# Patient Record
Sex: Female | Born: 1955 | Race: White | Hispanic: No | Smoking: Never smoker
Health system: Southern US, Community
[De-identification: ages and names within clinical notes are randomized; demographics above are authoritative.]

## PROBLEM LIST (undated history)

## (undated) DIAGNOSIS — E039 Hypothyroidism, unspecified: Secondary | ICD-10-CM

## (undated) DIAGNOSIS — Z8489 Family history of other specified conditions: Secondary | ICD-10-CM

## (undated) DIAGNOSIS — B1920 Unspecified viral hepatitis C without hepatic coma: Secondary | ICD-10-CM

## (undated) DIAGNOSIS — K219 Gastro-esophageal reflux disease without esophagitis: Secondary | ICD-10-CM

## (undated) DIAGNOSIS — Z87442 Personal history of urinary calculi: Secondary | ICD-10-CM

## (undated) DIAGNOSIS — R112 Nausea with vomiting, unspecified: Secondary | ICD-10-CM

## (undated) DIAGNOSIS — Z8052 Family history of malignant neoplasm of bladder: Secondary | ICD-10-CM

## (undated) DIAGNOSIS — C801 Malignant (primary) neoplasm, unspecified: Secondary | ICD-10-CM

## (undated) DIAGNOSIS — J189 Pneumonia, unspecified organism: Secondary | ICD-10-CM

## (undated) DIAGNOSIS — N200 Calculus of kidney: Secondary | ICD-10-CM

## (undated) DIAGNOSIS — I10 Essential (primary) hypertension: Secondary | ICD-10-CM

## (undated) HISTORY — PX: BREAST LUMPECTOMY: SHX2

## (undated) HISTORY — DX: Essential (primary) hypertension: I10

## (undated) HISTORY — PX: BREAST SURGERY: SHX581

## (undated) HISTORY — PX: BREAST EXCISIONAL BIOPSY: SUR124

## (undated) HISTORY — DX: Hypothyroidism, unspecified: E03.9

## (undated) HISTORY — DX: Family history of malignant neoplasm of bladder: Z80.52

## (undated) HISTORY — PX: ABDOMINAL HYSTERECTOMY: SHX81

---

## 1979-03-16 DIAGNOSIS — Z9289 Personal history of other medical treatment: Secondary | ICD-10-CM

## 1979-03-16 HISTORY — DX: Personal history of other medical treatment: Z92.89

## 1999-06-08 ENCOUNTER — Encounter: Payer: Self-pay | Admitting: General Surgery

## 1999-06-08 ENCOUNTER — Encounter: Admission: RE | Admit: 1999-06-08 | Discharge: 1999-06-08 | Payer: Self-pay | Admitting: General Surgery

## 2000-06-08 ENCOUNTER — Encounter: Admission: RE | Admit: 2000-06-08 | Discharge: 2000-06-08 | Payer: Self-pay | Admitting: General Surgery

## 2000-06-08 ENCOUNTER — Encounter: Payer: Self-pay | Admitting: General Surgery

## 2000-06-20 ENCOUNTER — Ambulatory Visit (HOSPITAL_COMMUNITY): Admission: RE | Admit: 2000-06-20 | Discharge: 2000-06-20 | Payer: Self-pay | Admitting: General Surgery

## 2000-06-20 ENCOUNTER — Encounter (INDEPENDENT_AMBULATORY_CARE_PROVIDER_SITE_OTHER): Payer: Self-pay

## 2001-06-28 ENCOUNTER — Encounter: Payer: Self-pay | Admitting: General Surgery

## 2001-06-28 ENCOUNTER — Encounter: Admission: RE | Admit: 2001-06-28 | Discharge: 2001-06-28 | Payer: Self-pay | Admitting: General Surgery

## 2002-07-05 ENCOUNTER — Encounter: Admission: RE | Admit: 2002-07-05 | Discharge: 2002-07-05 | Payer: Self-pay | Admitting: General Surgery

## 2002-07-05 ENCOUNTER — Encounter: Payer: Self-pay | Admitting: General Surgery

## 2003-07-08 ENCOUNTER — Encounter: Admission: RE | Admit: 2003-07-08 | Discharge: 2003-07-08 | Payer: Self-pay | Admitting: General Surgery

## 2004-06-17 ENCOUNTER — Encounter: Admission: RE | Admit: 2004-06-17 | Discharge: 2004-06-17 | Payer: Self-pay | Admitting: General Surgery

## 2004-12-29 ENCOUNTER — Encounter: Admission: RE | Admit: 2004-12-29 | Discharge: 2004-12-29 | Payer: Self-pay | Admitting: General Surgery

## 2005-08-19 ENCOUNTER — Encounter: Admission: RE | Admit: 2005-08-19 | Discharge: 2005-08-19 | Payer: Self-pay | Admitting: General Surgery

## 2006-09-20 ENCOUNTER — Encounter: Admission: RE | Admit: 2006-09-20 | Discharge: 2006-09-20 | Payer: Self-pay | Admitting: General Surgery

## 2007-03-13 ENCOUNTER — Encounter (INDEPENDENT_AMBULATORY_CARE_PROVIDER_SITE_OTHER): Payer: Self-pay | Admitting: Obstetrics and Gynecology

## 2007-03-13 ENCOUNTER — Inpatient Hospital Stay (HOSPITAL_COMMUNITY): Admission: RE | Admit: 2007-03-13 | Discharge: 2007-03-15 | Payer: Self-pay | Admitting: Obstetrics and Gynecology

## 2007-09-26 ENCOUNTER — Encounter: Admission: RE | Admit: 2007-09-26 | Discharge: 2007-09-26 | Payer: Self-pay | Admitting: Obstetrics and Gynecology

## 2008-05-01 ENCOUNTER — Ambulatory Visit: Admission: RE | Admit: 2008-05-01 | Discharge: 2008-05-01 | Payer: Self-pay | Admitting: Gynecology

## 2008-05-21 ENCOUNTER — Ambulatory Visit (HOSPITAL_COMMUNITY): Admission: RE | Admit: 2008-05-21 | Discharge: 2008-05-21 | Payer: Self-pay | Admitting: Gynecology

## 2008-05-21 ENCOUNTER — Encounter: Payer: Self-pay | Admitting: Gynecology

## 2008-05-30 ENCOUNTER — Encounter: Admission: RE | Admit: 2008-05-30 | Discharge: 2008-05-30 | Payer: Self-pay | Admitting: Gastroenterology

## 2008-07-04 ENCOUNTER — Encounter (INDEPENDENT_AMBULATORY_CARE_PROVIDER_SITE_OTHER): Payer: Self-pay | Admitting: Diagnostic Radiology

## 2008-07-04 ENCOUNTER — Ambulatory Visit (HOSPITAL_COMMUNITY): Admission: RE | Admit: 2008-07-04 | Discharge: 2008-07-04 | Payer: Self-pay | Admitting: Gastroenterology

## 2008-07-12 ENCOUNTER — Ambulatory Visit: Admission: RE | Admit: 2008-07-12 | Discharge: 2008-07-12 | Payer: Self-pay | Admitting: Gynecology

## 2008-09-06 ENCOUNTER — Ambulatory Visit: Admission: RE | Admit: 2008-09-06 | Discharge: 2008-09-06 | Payer: Self-pay | Admitting: Gynecology

## 2008-09-06 ENCOUNTER — Encounter: Payer: Self-pay | Admitting: Gynecology

## 2008-09-06 ENCOUNTER — Other Ambulatory Visit: Admission: RE | Admit: 2008-09-06 | Discharge: 2008-09-06 | Payer: Self-pay | Admitting: Gynecology

## 2008-09-26 ENCOUNTER — Encounter: Admission: RE | Admit: 2008-09-26 | Discharge: 2008-09-26 | Payer: Self-pay | Admitting: Obstetrics and Gynecology

## 2009-02-21 ENCOUNTER — Other Ambulatory Visit: Admission: RE | Admit: 2009-02-21 | Discharge: 2009-02-21 | Payer: Self-pay | Admitting: Gynecology

## 2009-02-21 ENCOUNTER — Ambulatory Visit: Admission: RE | Admit: 2009-02-21 | Discharge: 2009-02-21 | Payer: Self-pay | Admitting: Gynecology

## 2009-08-09 IMAGING — US US BIOPSY
1 series · 14 of 18 positions shown · non-contrast
Comparison: none

CLINICAL HISTORY: Hepatitis C

[Series 1: us biopsy · 0.23mm/px · 14 of 18 slices shown]
[im 1/18]
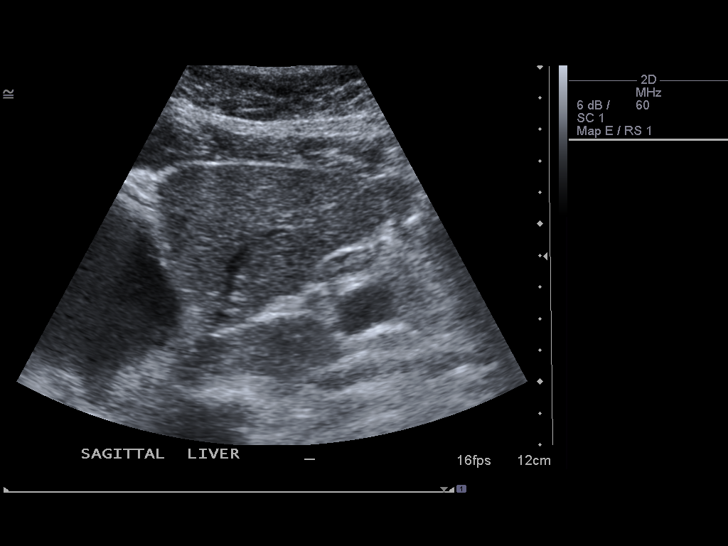
[im 2/18]
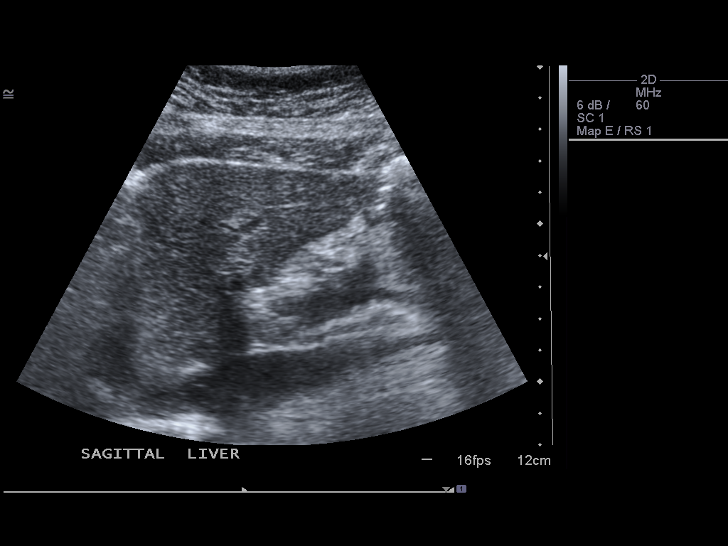
[im 4/18]
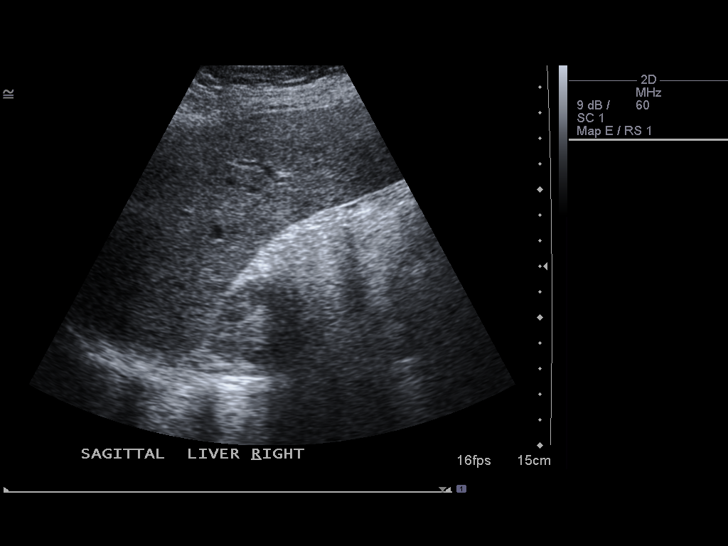
[im 5/18]
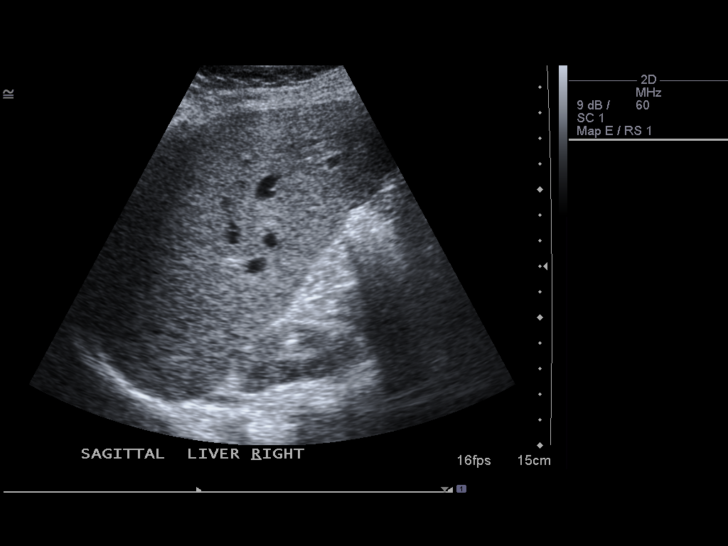
[im 6/18]
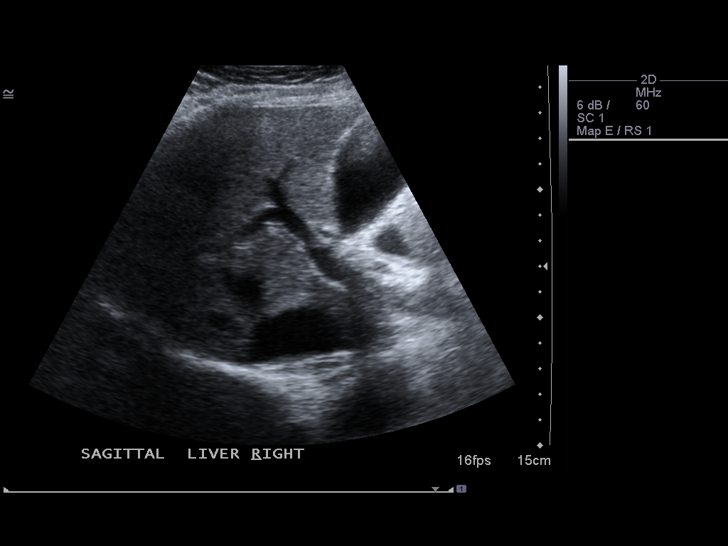
[im 8/18]
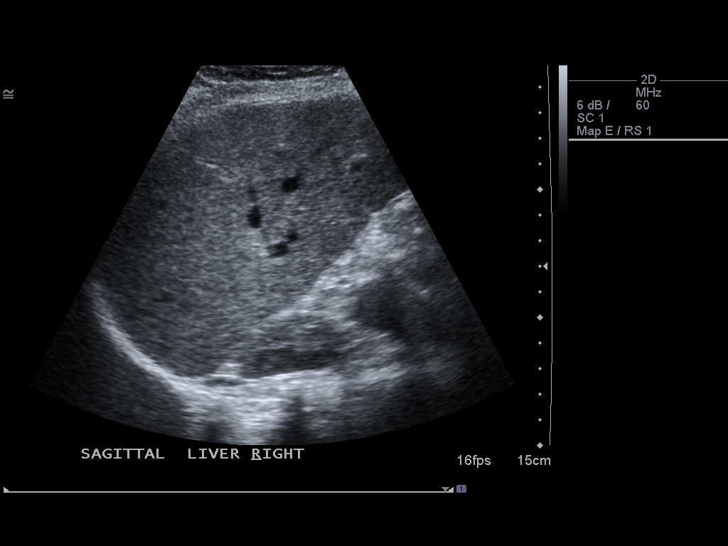
[im 9/18]
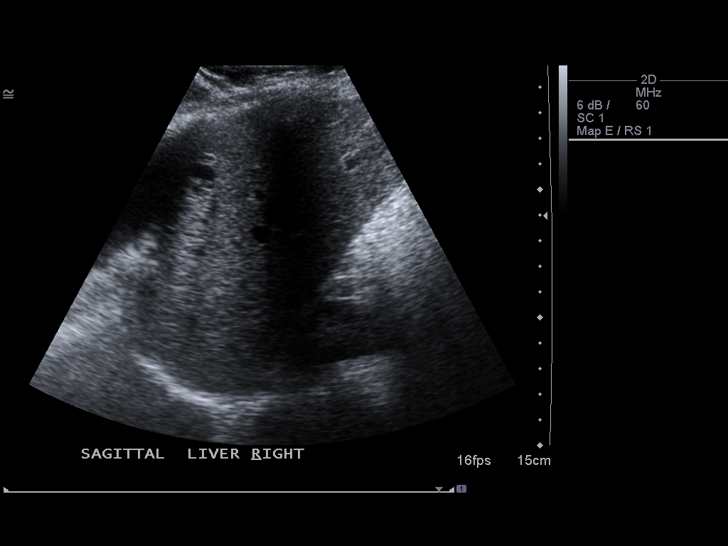
[im 10/18]
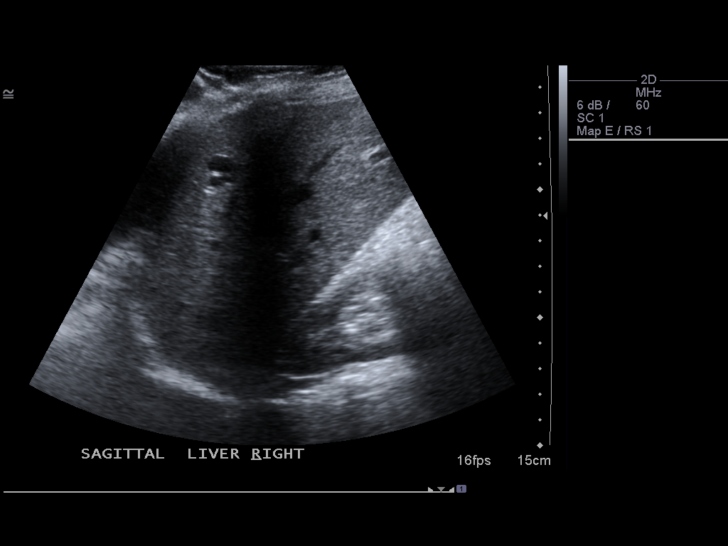
[im 11/18]
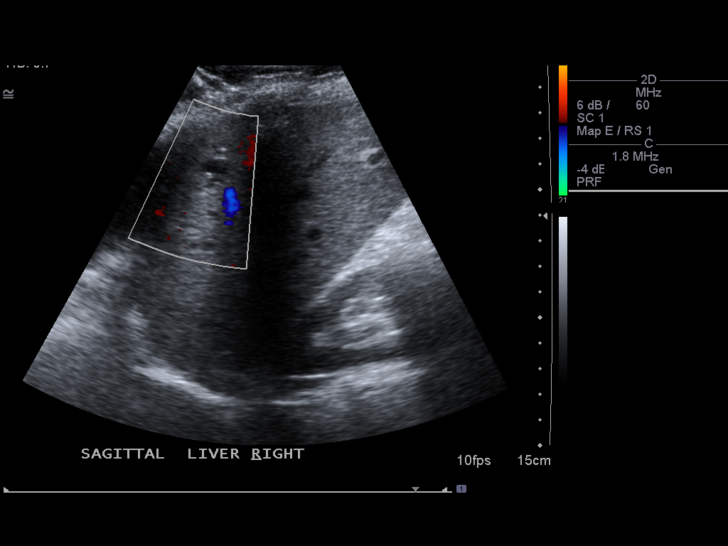
[im 13/18]
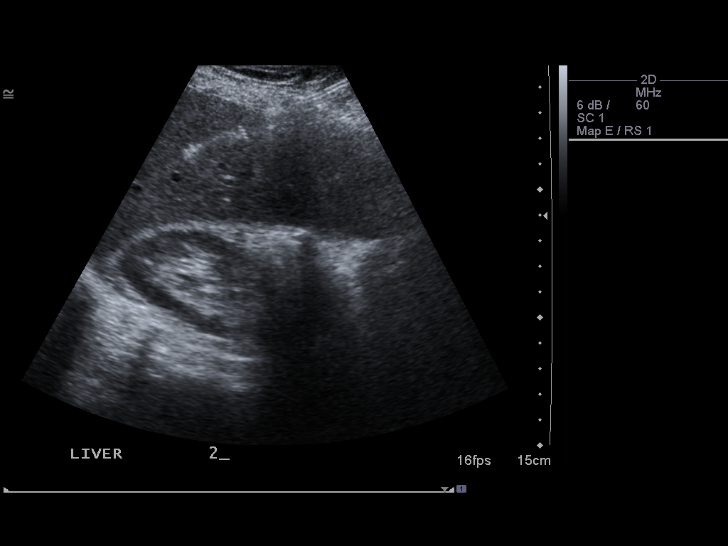
[im 14/18]
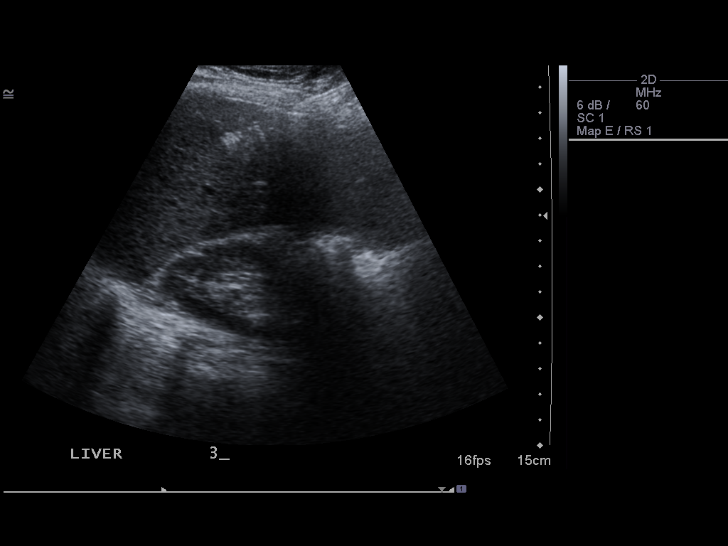
[im 15/18]
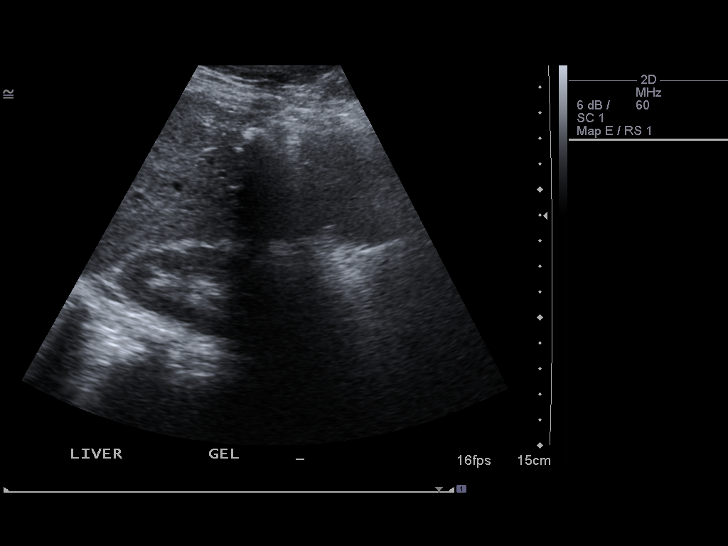
[im 17/18]
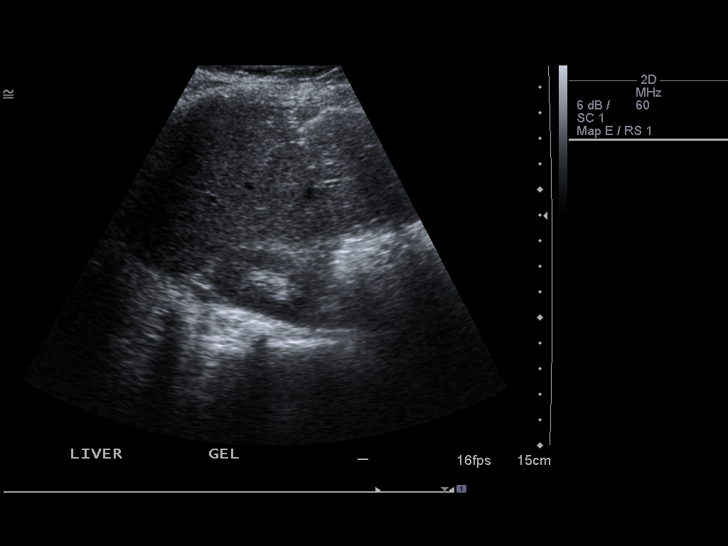
[im 18/18]
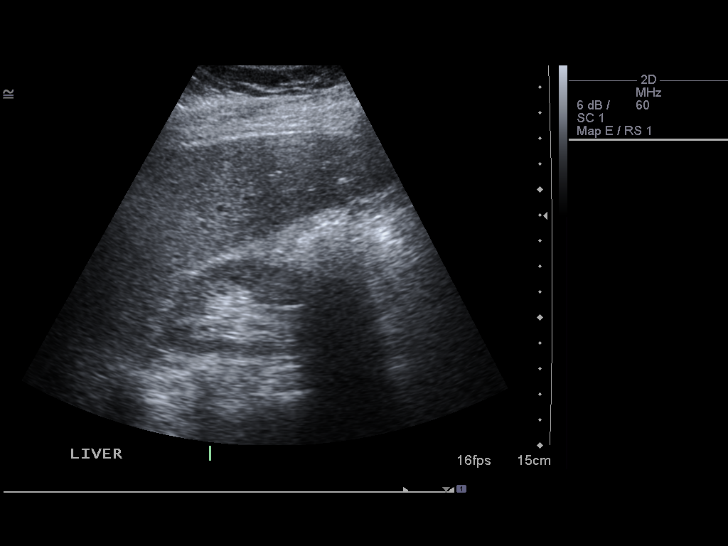

[14 of 18 positions shown; findings below may reference images not displayed]

PROCEDURE(S): ULTRASOUND GUIDED LIVER BIOPSY

Medications:Versed 4 mg, Fentanyl 100 mcg

Sedation time:30 minutes

Fluoroscopy time: None

Procedure:The procedure was explained to the patient.  The risks
and benefits of the procedure were discussed and the patient's
questions were addressed.  Informed consent was obtained from the
patient. The right abdomen was prepped and draped in a sterile
fashion.  The skin and the liver capsule were anesthetized with 1%
lidocaine.  17 gauge coaxial needle was directed in the right
hepatic lobe.  Three core biopsies were obtained with an 18 gauge
device.  The tract was embolized with Gelfoam. No immediate
complication.
FINDINGS: Normal appearance of the right hepatic lobe.
IMPRESSION: Ultrasound guided liver biopsy.

## 2009-09-03 ENCOUNTER — Other Ambulatory Visit: Admission: RE | Admit: 2009-09-03 | Discharge: 2009-09-03 | Payer: Self-pay | Admitting: Gynecology

## 2009-09-03 ENCOUNTER — Ambulatory Visit: Admission: RE | Admit: 2009-09-03 | Discharge: 2009-09-03 | Payer: Self-pay | Admitting: Gynecology

## 2009-09-29 ENCOUNTER — Encounter: Admission: RE | Admit: 2009-09-29 | Discharge: 2009-09-29 | Payer: Self-pay | Admitting: Obstetrics and Gynecology

## 2010-06-24 LAB — CBC
Hemoglobin: 14.5 g/dL (ref 12.0–15.0)
RBC: 4.34 MIL/uL (ref 3.87–5.11)
WBC: 4.5 10*3/uL (ref 4.0–10.5)

## 2010-06-25 LAB — COMPREHENSIVE METABOLIC PANEL
AST: 59 U/L — ABNORMAL HIGH (ref 0–37)
Albumin: 4.1 g/dL (ref 3.5–5.2)
BUN: 12 mg/dL (ref 6–23)
CO2: 30 mEq/L (ref 19–32)
GFR calc non Af Amer: >60 mL/min (ref >60)
Glucose, Bld: 105 mg/dL — ABNORMAL HIGH (ref 70–99)
Total Bilirubin: 1.2 mg/dL (ref 0.3–1.2)
Total Protein: 7 g/dL (ref 6.0–8.3)

## 2010-06-25 LAB — CBC
Hemoglobin: 14.6 g/dL (ref 12.0–15.0)
MCV: 96.2 fL (ref 78.0–100.0)
RBC: 4.48 MIL/uL (ref 3.87–5.11)
RDW: 13.2 % (ref 11.5–15.5)

## 2010-06-25 LAB — DIFFERENTIAL
Basophils Relative: 0 % (ref 0–1)
Eosinophils Absolute: 0.1 10*3/uL (ref 0.0–0.7)
Eosinophils Relative: 1 % (ref 0–5)
Lymphs Abs: 2.3 10*3/uL (ref 0.7–4.0)
Monocytes Absolute: 0.5 10*3/uL (ref 0.1–1.0)

## 2010-07-28 NOTE — Op Note (Signed)
NAME:  Amanda Herring, Amanda Herring               ACCOUNT NO.:  [id number]   MEDICAL RECORD NO.:  [id number]          PATIENT TYPE:  AMB   LOCATION:  SDC                           FACILITY:  [facility]   PHYSICIAN:  Michelle L. Grewal, M.D.DATE OF BIRTH:  [birth year]   DATE OF PROCEDURE:  03/13/2007  DATE OF DISCHARGE:                               OPERATIVE REPORT   PREOPERATIVE DIAGNOSES:  Persistent cervical dysplasia.   POSTOPERATIVE DIAGNOSES:  Persistent cervical dysplasia.   PROCEDURE:  Opened laparoscopic-assisted vaginal hysterectomy and  bilateral salpingo-oophorectomy.   SURGEON:  Dr. Vincente Poli.   ASSISTANT:  Dr. Renaldo Fiddler.   ANESTHESIA:  General.   FINDINGS:  Normal uterus, tubes and ovaries.   SPECIMENS:  Above sent to pathology.   ESTIMATED BLOOD LOSS:  200 mL.   DRAINS:  Foley catheter.   PROCEDURE:  The patient was taken to the operating room. She was  intubated without difficulty.  She was then prepped and draped in the  usual sterile fashion.  A Foley was inserted. A uterine manipulator was  inserted.  Attention was then turned to tummy where a small  infraumbilical incision was made after local was infiltrated. We then  grasped the fascia using Allis clamps, lifted that up and entered the  peritoneal cavity using Mayo scissors. I then placed a stitch of #0  Vicryl at each corner of the fascial incision, inserted the [facility]  laparoscope trocar and placed the pneumoperitoneum through this.  We  then placed the scope through the trocar sheath.  The patient of course  was status post two vertical C-sections, but she had no adhesions at all  and I felt that she was an excellent candidate for open LAVH/BSO. Her  ovaries appeared normal.  We initially identified each ovary.  I then  placed a 5 mm suprapubic trocar under direct visualization and then  identified the ureter which was well away from our IP ligament and  placed the gyrus just beneath each ovary, burned and transected it  and  carried it through to the round ligament.  This was done on the right  and then on the left with excellent hemostasis. After we did this part,  we then released the pneumoperitoneum and then turned our attention  vaginally where a weighted speculum was placed in the vagina.  A  circumferential incision was made around the cervix.  The posterior cul-  de-sac was entered sharply and the anterior cul-de-sac was entered  sharply as well.  We then placed curved Heaney clamps across the  uterosacral cardinal ligaments on either side.  Each pedicle was cut and  suture ligated using #0 Vicryl suture.  We then walked our way up the  broad ligament.  Each pedicle was suture ligated using O Vicryl sutures.  The specimen was removed. The posterior cuff was closed from 3 to 9  o'clock in a continuous running locked stitch using #0 Vicryl suture.  The cuff was then closed completely anterior to posterior in a running  locked stitch using #0 Vicryl suture.  At this point, no bleeding was  noted.  We  turned our attention back up to the abdomen where the  pneumoperitoneum was performed again.  The patient was placed in  Trendelenburg position.  The pelvis was  completely hemostatic.  We then released the pneumoperitoneum, removed  the trocars.  I then tied the two #0 Vicryl sutures down and closed the  skin using Dermabond skin adhesive at both sites.  All sponge, lap and  instrument counts were correct x2.  The patient went to the recovery  room in stable condition.      Michelle L. Vincente Poli, M.D.  Electronically Signed     MLG/MEDQ  D:  03/13/2007  T:  03/13/2007  Job:  [id number]

## 2010-07-28 NOTE — Op Note (Signed)
NAME:  Amanda Herring, Amanda Herring               ACCOUNT NO.:  [id number]   MEDICAL RECORD NO.:  [id number]          PATIENT TYPE:  AMB   LOCATION:  DAY                          FACILITY:  [facility]   PHYSICIAN:  De Blanch, M.D.DATE OF BIRTH:  11-10-1955   DATE OF PROCEDURE:  05/21/2008  DATE OF DISCHARGE:                               OPERATIVE REPORT   PREOPERATIVE DIAGNOSIS:  Severe dysplasia of the upper vagina.   POSTOPERATIVE DIAGNOSIS:  Severe dysplasia of the upper vagina.   PROCEDURE:  Partial upper vaginectomy.   SURGEON:  De Blanch, M.D.   ASSISTANT:  Antionette Char   ANESTHESIA:  General with orotracheal tube.   ESTIMATED BLOOD LOSS:  50 mL.   SURGICAL FINDINGS:  After applying Lugol's stain in the upper vagina, an  area of non-uptake was noted in the right upper vaginal fornix.  This  had previously been biopsied and confirmed to be severe dysplasia.   PROCEDURE:  The patient was brought to the operating room and, after  satisfactory attainment of general anesthesia, was positioned in candy-  cane stirrups.  The perineum and vagina were prepped with Hibiclens and  the patient was draped.  The bladder was emptied with a straight  catheter.  A weighted speculum placed in the posterior vagina, a Deaver  elevated the anterior vagina.  Lugol's solution was applied and the  lesion identified.  Using a 15-blade, a circumferential incision in the  vaginal mucosa was created around the lesion.  Using Stroulie scissors,  the vaginal mucosa was then excised.  In the course of the excising the  upper fornix, the peritoneal cavity was identified.  There was no bowel  injury.  Once the lesion was entirely excised, the posterior colpotomy  was closed with interrupted figure-of-eight sutures of 2-0 Vicryl.  Hemostasis in the vaginal mucosa was achieved with cautery,  then the vaginal mucosa reapproximated with interrupted sutures of 2-0  Vicryl suture.  Hemostasis was  noted to be excellent.  The patient was  awakened from anesthesia and taken to the recovery room in satisfactory  condition.  Sponge, needle and instrument counts correct x2.      De Blanch, M.D.  Electronically Signed     DC/MEDQ  D:  05/21/2008  T:  05/22/2008  Job:  [id number]   cc:   Telford Nab, R.N.  [address]  [place], [place] [address]   Roseanna Rainbow, M.D.  Fax: [phone]   Michelle L. Vincente Poli, M.D.  Fax: [phone]

## 2010-07-28 NOTE — Consult Note (Signed)
NAME:  Amanda Herring, Amanda Herring               ACCOUNT NO.:  [id number]   MEDICAL RECORD NO.:  [id number]          PATIENT TYPE:  OUT   LOCATION:  GYN                          FACILITY[facility]  [facility]   PHYSICIAN:  De Blanch, M.D.DATE OF BIRTH:  02/06/1956   DATE OF CONSULTATION:  DATE OF DISCHARGE:                                 CONSULTATION   CHIEF COMPLAINT:  Vaginal dysplasia.   HISTORY OF PRESENT ILLNESS:  A 55 year old white female seen in  consultation at the request of Dr. 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   The patient underwent a laparoscopically-assisted vaginal  hysterectomy/bilateral salpingo-oophorectomy in December of 2008 for  treatment of a high-grade SIL lesion.  She had previously had a cold  knife conization and a LEEP procedure.  Subsequently, she had a Pap  smear in January of 2010 which showed high-grade dysplasia and underwent  colposcopy with directed biopsies of the upper vagina showing high-grade  squamous intraepithelial lesion.   PAST MEDICAL HISTORY:   MEDICAL ILLNESSES:  None.   PAST SURGICAL HISTORY:  1. Cesarean section x2.  2. Tubal ligation.  3. Hysterectomy with bilateral salpingo-oophorectomy.  4. Breast lumpectomy.   CURRENT MEDICATIONS:  1. Xanax p.r.n. sleep.  2. Vitamin D.  3. Calcium.  4. Reflex medication.   DRUG ALLERGIES:  CODEINE.   SOCIAL HISTORY:  The patient does not smoke.  She is single.   FAMILY HISTORY:  Negative for gynecologic, breast or colon cancer.   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   PHYSICAL EXAMINATION:  VITAL SIGNS:  Height 5 feet 5 inches.  Weight 210  pounds.  GENERAL:  The patient is a healthy white female in no acute distress.  HEENT:  Negative.  NECK:  Supple without thyromegaly.  There is no supraclavicular or  inguinal adenopathy.  ABDOMEN:  Obese, soft, nontender.  No mass, organomegaly,  ascites or  hernias noted.  PELVIC:  Exam EG/BUS, vagina, bladder and urethra are normal.  The  vaginal cuff is well-supported.  No gross lesions are noted.  Bimanual  exam revealed no mass, induration or nodularity.   PROCEDURE NOTE:  Colposcopic examination of the vagina is performed.  There is a very discrete lesion in the upper right vagina in the angle.  This is consistent with severe dysplasia.  The left sciatic cuff appears  normal, and I do not see any other sites of disease when examining the  remainder of the vagina.   IMPRESSION:  Vaginal cuff recurrence of high-grade dysplasia,  predominantly in the right vaginal apex.  Management options would  include laser vaporization or wide excision (upper colpectomy).  Because  this lesion is located up in the fornix and is within folds, I think it  would be best treated with excision rather than rely on the laser to be  able to adequately ablate all of the lesion completely.  The patient is  in agreement with this plan.  We will schedule her for surgery in the  near future.  De Blanch, M.D.  Electronically Signed     DC/MEDQ  D:  05/01/2008  T:  05/01/2008  Job:  [id number]   cc:   Marcelino Duster L. Vincente Poli, M.D.  Fax: [phone]   Telford Nab, R.N.  [address]  [place], [place] [address]

## 2010-07-28 NOTE — Consult Note (Signed)
NAME:  Amanda Herring, Amanda Herring               ACCOUNT NO.:  [id number]   MEDICAL RECORD NO.:  [id number]          PATIENT TYPE:  OUT   LOCATION:  GYN                          FACILITY:  [facility]   PHYSICIAN:  De Blanch, M.D.DATE OF BIRTH:  1956-01-05   DATE OF CONSULTATION:  09/06/2008  DATE OF DISCHARGE:                                 CONSULTATION   GYN ONCOLOGY CLINIC   CHIEF COMPLAINT:  Vaginal dysplasia.   INTERVAL HISTORY:  The patient returns today for her first Pap smear  after having undergone an excision of the upper vagina on May 21, 2008.  Since her last visit she has done well.  She denies any GI or GU  symptoms.  She has no pelvic pain, pressure, vaginal bleeding or  discharge.  She is not sexually active.  She is using Vagifem two times  a week and seems to be very satisfied with this and prefers this  management much better than Premarin cream.   HISTORY OF PRESENT ILLNESS:  The patient underwent a laparoscopically  assisted vaginal hysterectomy and bilateral salpingo-oophorectomy in  December, 2008 for treatment of high grade squamous intraepithelial  lesion.  She had previously had a cold knife conization and LEEP  procedure.  Subsequent Pap smears showed severe dysplasia and on  examination she had a lesion in the upper vagina.  We undertook an upper  colpectomy in March of 2010. Final pathology showed a high grade  squamous intraepithelial lesion with adjacent low grade squamous  intraepithelial lesion.  Margins at 9 o'clock and 12 o'clock were  involved with high grade dysplasia.  The patient had an uncomplicated  postoperative course.   PAST MEDICAL HISTORY/MEDICAL ILLNESSES:  Hepatitis C.  The patient has  previously had a liver biopsy and was treated in 1981.  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   PAST SURGICAL HISTORY:  1. Cesarean section x2.  2. Tubal ligation.  3.  Hysterectomy with bilateral salpingo-oophorectomy.  4. Breast lumpectomy.  Patient has a past history of ductal carcinoma      in situ.   CURRENT MEDICATIONS:  1. Xanax p.r.n. sleep.  2. Vitamin D.  3. Calcium.  4. Vagifem.   DRUG ALLERGIES:  CODEINE.   SOCIAL HISTORY:  The patient does not smoke.  She is single.   FAMILY HISTORY:  Negative for gynecologic, breast or colon cancer.   PHYSICAL EXAMINATION:  VITAL SIGNS:  Weight 194.5 pounds.  Blood  pressure 118/70.  GENERAL:  The patient is healthy-appearing white female in no acute  distress.  HEENT:  Negative.  NECK:  Supple without thyromegaly.  There is no supraclavicular or  inguinal adenopathy.  ABDOMEN:  Is obese, soft, nontender.  No masses, organomegaly, ascites  or hernias noted.  PELVIC EXAM:  EG/BUS, vagina, bladder and urethra are normal.  The  vaginal cuff is well healed.  No lesions are noted grossly.  On bimanual  exam there are no masses or nodularity.  The upper vagina on the right  is somewhat strictured.   IMPRESSION:  VAIN3, status post wide local excision, March, 2010.  Pap  smear is repeated.   PLAN:  If the Pap smear today is normal, or shows low grade dysplasia,  we will have the patient return in 6 months.  If it shows high grade  dysplasia, we will have her come back sooner and have a repeat  colposcopy at that time.      De Blanch, M.D.  Electronically Signed     DC/MEDQ  D:  09/06/2008  T:  09/06/2008  Job:  [id number]   cc:   Marcelino Duster L. Vincente Poli, M.D.  Fax: [phone]   Telford Nab, R.N.  [address]  [place], [place] [address]

## 2010-07-28 NOTE — Consult Note (Signed)
NAME:  Amanda Herring, Amanda Herring               ACCOUNT NO.:  [id number]   MEDICAL RECORD NO.:  [id number]          PATIENT TYPE:  OUT   LOCATION:  GYN                          FACILITY:  [facility]   PHYSICIAN:  De Blanch, M.D.DATE OF BIRTH:  [birth year]   DATE OF CONSULTATION:  07/12/2008  DATE OF DISCHARGE:                                 CONSULTATION   CHIEF COMPLAINT:  Postoperative followup status post upper vaginectomy.   INTERVAL HISTORY:  The patient underwent excision of VAIN-3 in the upper  vagina on May 21, 2008.  Final pathology showed a high-grade squamous  intraepithelial lesion.  Margins at 9 o'clock and 12 o'clock were  involved.  The patient has had an uncomplicated postoperative course.  She does tell me that somewhere along the line postoperatively, she was  found to have abnormal liver function tests and has subsequently been  found to have hepatitis C.  She recently had a liver biopsy obtained by  Dr. Kinnie Scales, but has not heard those results.  Apparently she is planning  on going to [facility] for treatment.   From a gynecologic point of view, she has no pelvic pain, pressure,  vaginal bleeding or discharge, and no pain.   PHYSICAL EXAMINATION:  PELVIC:  EGBUS is normal.  Vagina is healing well  at the apex.  Bimanual reveals no nodularity, masses or pain.   IMPRESSION:  Status post resection of upper vaginal severe dysplasia.  She did have positive margins.  She is having maximum postoperative  healing.   FOLLOW UP:  We will ask her to return in June to have initial Pap smear  obtained.   DISCHARGE MEDICATIONS:  She is given a prescription for Vagifem because  she does not like the vaginal creams that have been previously  prescribed.      De Blanch, M.D.  Electronically Signed     DC/MEDQ  D:  07/12/2008  T:  07/12/2008  Job:  [id number]   cc:   Marcelino Duster L. Vincente Poli, M.D.  Fax: [phone]   Telford Nab, R.N.  [address]  [place], [place]  [address]

## 2010-07-31 NOTE — Discharge Summary (Signed)
NAME:  Amanda Herring, Amanda Herring               ACCOUNT NO.:  [id number]   MEDICAL RECORD NO.:  [id number]          PATIENT TYPE:  INP   LOCATION:  9318                          FACILITY:  [facility]   PHYSICIAN:  Michelle L. Grewal, M.D.DATE OF BIRTH:  October 21, 1955   DATE OF ADMISSION:  03/13/2007  DATE OF DISCHARGE:  03/15/2007                               DISCHARGE SUMMARY   PREOPERATIVE DIAGNOSIS:  Persistent cervical dysplasia.   POSTOPERATIVE DIAGNOSIS:  Persistent cervical dysplasia.   OPERATION/PROCEDURE:  Open laparoscopic-assisted vaginal hysterectomy  and bilateral salpingo-oophorectomy.   HOSPITAL COURSE:  The patient is a 55 year old, G3, P3, postmenopausal  who has had a history of cervical dysplasia.  She had a cone biopsy in  July that was unremarkable.  Pap smear in November showed high grade  SIL.  She was scheduled for open LAVH and BSO.  She underwent the  procedure which was unremarkable.  She did very well postoperatively,  except she did develop what I think was a URI sinusitis for which I  placed her on Augmentin and Sudafed and Tessalon Perles.  Because of  that I did give her some respiratory treatments because she does have  some slight wheezing and by postoperative day #2 her congestion was a  lot better.  She will remain afebrile with stable vital signs.  She was  discharged home on postoperative day #2.  Prior to discharge, I did do a  chest x-ray which was negative for pneumonia or infiltration because she  had received general anesthesia and I wanted to make sure there was  anything more serious going on.   DISCHARGE MEDICATIONS:  She was sent home with Augmentin to take for a  week, Tessalon Perles, Sudafed, and Dilaudid to take as needed for pain.   FOLLOW UP:  She will follow up in our office in one to two weeks.   DISCHARGE INSTRUCTIONS:  She was advised no driving.  Call for a  temperature greater than 100.5, heavy vaginal bleeding, redness or  drainage from the  incision site or severe abdominal pain.      Michelle L. Vincente Poli, M.D.  Electronically Signed     MLG/MEDQ  D:  04/07/2007  T:  04/07/2007  Job:  [id number]

## 2010-07-31 NOTE — Op Note (Signed)
[facility]  Patient:    Amanda Herring, Amanda Herring                      MRN: [id number] Proc. Date: 06/20/00 Adm. Date:  [id number] Attending:  Carson Myrtle CC:         Festus Holts, M.D.   Operative Report  PREOPERATIVE DIAGNOSIS:  Masses right breast.  POSTOPERATIVE DIAGNOSIS:  Masses right breast.  PROCEDURE:  Excision multiple masses right breast.  SURGEON:  Timothy E. Earlene Plater, M.D.  ANESTHESIA:  Local standby.  INDICATIONS:  Ms. Hoots is a bit of a complicated patient.  She is now 66. She had augmentation mammoplasty done approximately three years ago by Dr. 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  She has been and followed without problems with the left breast but now has developed the same similar nodules in the right breast. Mammography, ultrasound, and clinical follow-up had been done, and it is recommended now, both by the radiologist and the patient, and I agree that there are multiple cysts beneath the areolar complex on the right breast.  DESCRIPTION OF PROCEDURE:  The patient brought to the operating room, and IV started sedation given.  The right breast was prepped and draped in the usual fashion.  I made a supra-areolar curvilinear incision at the junction of the areolar of the skin, peeled the areolar back from approximately 40% of the areolar complex and removed multiple small blue dome cysts.  I was able to palpate this area as well as visualize it under magnification, and no other lesions, cysts, or nodules were remaining.  Bleeding was controlled with the cautery.  The capsule of the implant was not visualized.  The wound was closed with 3-0 Vicryl and 4-0 Monocryl.  Steri-Strips applied.  Counts correct.  All tissue submitted together for pathology for permanent.  She tolerated it well and was  removed to the recovery room in good condition.  Written and verbal instructions were given including Vicodin, and she will receive follow-up as an outpatient. DD:  06/20/00 TD:  06/20/00 Job: [id number]

## 2010-09-15 ENCOUNTER — Other Ambulatory Visit: Payer: Self-pay | Admitting: Obstetrics and Gynecology

## 2010-09-15 DIAGNOSIS — Z1231 Encounter for screening mammogram for malignant neoplasm of breast: Secondary | ICD-10-CM

## 2010-10-02 ENCOUNTER — Ambulatory Visit: Payer: Self-pay

## 2010-10-19 ENCOUNTER — Ambulatory Visit
Admission: RE | Admit: 2010-10-19 | Discharge: 2010-10-19 | Disposition: A | Discharge status: Home / Self Care | Payer: BC Managed Care – PPO | Source: Ambulatory Visit | Attending: Obstetrics and Gynecology | Admitting: Obstetrics and Gynecology

## 2010-10-19 DIAGNOSIS — Z1231 Encounter for screening mammogram for malignant neoplasm of breast: Secondary | ICD-10-CM

## 2010-12-02 ENCOUNTER — Ambulatory Visit (HOSPITAL_BASED_OUTPATIENT_CLINIC_OR_DEPARTMENT_OTHER)
Admission: RE | Admit: 2010-12-02 | Discharge: 2010-12-02 | Disposition: A | Discharge status: Home / Self Care | Payer: BC Managed Care – PPO | Source: Ambulatory Visit | Attending: Urology | Admitting: Urology

## 2010-12-02 ENCOUNTER — Ambulatory Visit (HOSPITAL_BASED_OUTPATIENT_CLINIC_OR_DEPARTMENT_OTHER): Admission: RE | Admit: 2010-12-02 | Payer: Self-pay | Source: Ambulatory Visit | Admitting: Urology

## 2010-12-02 DIAGNOSIS — R109 Unspecified abdominal pain: Secondary | ICD-10-CM | POA: Insufficient documentation

## 2010-12-02 DIAGNOSIS — Z01812 Encounter for preprocedural laboratory examination: Secondary | ICD-10-CM | POA: Insufficient documentation

## 2010-12-02 DIAGNOSIS — Z01810 Encounter for preprocedural cardiovascular examination: Secondary | ICD-10-CM | POA: Insufficient documentation

## 2010-12-02 DIAGNOSIS — Q638 Other specified congenital malformations of kidney: Secondary | ICD-10-CM | POA: Insufficient documentation

## 2010-12-07 NOTE — Op Note (Signed)
NAME:  Amanda Herring, Amanda Herring NO.:  [id number]  MEDICAL RECORD NO.:  [id number]  LOCATION:                               FACILITY:  [facility]  PHYSICIAN:  Natalia Leatherwood, MD    DATE OF BIRTH:  November 09, 1955  DATE OF PROCEDURE:  12/02/2010 DATE OF DISCHARGE:                              OPERATIVE REPORT   PREOPERATIVE DIAGNOSES:  Left distal ureteral stone and left flank pain.  POSTOPERATIVE DIAGNOSES:  Left flank pain and left extrarenal pelvis of the kidney.  FINDINGS:  Wide open ureter with no obstructing stone.  No internal scar tissue of the left ureter.  Prominent left renal pelvis on retrograde pyelogram.  PROCEDURES:  Cystoscopy, left retrograde pyelogram, left ureteroscopy.  COMPLICATIONS:  None.  BLOOD LOSS:  None.  SPECIMEN:  None.  COMPLICATIONS:  None.  HISTORY OF PRESENT ILLNESS:  This is a 55 year old female who has a history of stone on CT scan when she presented to an outside hospital. She reported to my clinic where KUB showed possible left lower ureteral stone.  The patient did followup with continued pain and we discussed treatment options and she elected for ureteroscopy and presented for that procedure today.  PROCEDURE IN DETAIL:  Informed consent was obtained.  The patient was taken to operating room where she was placed in supine position.  IV antibiotics were infused and general anesthesia was induced.  She was placed in dorsal lithotomy position, making sure to pad all pertinent neurovascular pressure points.  Following this, her genitals were prepped and draped in usual sterile fashion.  A cystoscope was passed through the urethra and into the bladder.  The left ureteral orifice was immediately visible and it was cannulated with a 5-French ureteral catheter.  Retrograde pyelogram was obtained; it showed no filling defects in the ureter.  There was a prominent left renal pelvis, however, that drained out fairly well.  Next, 2  sensor-tip wires were placed up the left ureter with good curl of the renal pelvis on fluoroscopy.  One wire was secured as a safety wire while the other was used as a working wire.  A semi-rigid ureteroscope was placed up into the left ureteral orifice over the working wire.  The working wire was then removed and the semi-rigid ureteroscope was able to be navigated all the way up into the renal pelvis.  I was not able to inspect around the entire renal pelvis because inability to flex with the semi-rigid scope, however, there was no scar tissue and no obstruction in the ureteropelvic junction that I could visualize.  I could not rule out external compression.  There were no stones throughout the ureter and no strictured areas through the ureter.  The scope was then removed and a safety wire was removed and this completed the procedure.  The patient was placed back in supine position.  Anesthesia was reversed and she was taken to PACU in stable condition.          ______________________________ Natalia Leatherwood, MD     DW/MEDQ  D:  12/02/2010  T:  12/02/2010  Job:  [id number]  Electronically Signed by Natalia Leatherwood MD on 12/07/2010 08:48:40 PM

## 2010-12-18 LAB — CBC
Hemoglobin: 12.4
MCV: 94.6
Platelets: 212
RBC: 3.74 — ABNORMAL LOW
RDW: 13.4
RDW: 13.7
WBC: 7.4

## 2011-08-06 ENCOUNTER — Other Ambulatory Visit: Payer: Self-pay | Admitting: Obstetrics and Gynecology

## 2011-08-06 DIAGNOSIS — Z1231 Encounter for screening mammogram for malignant neoplasm of breast: Secondary | ICD-10-CM

## 2011-10-27 ENCOUNTER — Ambulatory Visit
Admission: RE | Admit: 2011-10-27 | Discharge: 2011-10-27 | Disposition: A | Discharge status: Home / Self Care | Payer: BC Managed Care – PPO | Source: Ambulatory Visit | Attending: Obstetrics and Gynecology | Admitting: Obstetrics and Gynecology

## 2011-10-27 DIAGNOSIS — Z1231 Encounter for screening mammogram for malignant neoplasm of breast: Secondary | ICD-10-CM

## 2011-12-01 ENCOUNTER — Other Ambulatory Visit: Payer: Self-pay | Admitting: Obstetrics and Gynecology

## 2012-05-29 ENCOUNTER — Other Ambulatory Visit (HOSPITAL_COMMUNITY): Payer: BC Managed Care – PPO

## 2012-06-05 ENCOUNTER — Encounter: Payer: BC Managed Care – PPO | Admitting: Physician Assistant

## 2012-12-05 ENCOUNTER — Other Ambulatory Visit: Payer: Self-pay

## 2012-12-05 DIAGNOSIS — Z1231 Encounter for screening mammogram for malignant neoplasm of breast: Secondary | ICD-10-CM

## 2012-12-22 ENCOUNTER — Ambulatory Visit
Admission: RE | Admit: 2012-12-22 | Discharge: 2012-12-22 | Disposition: A | Discharge status: Home / Self Care | Payer: BC Managed Care – PPO | Source: Ambulatory Visit

## 2012-12-22 DIAGNOSIS — Z1231 Encounter for screening mammogram for malignant neoplasm of breast: Secondary | ICD-10-CM

## 2013-05-04 ENCOUNTER — Other Ambulatory Visit: Payer: Self-pay | Admitting: Obstetrics and Gynecology

## 2013-12-06 ENCOUNTER — Other Ambulatory Visit: Payer: Self-pay

## 2013-12-06 DIAGNOSIS — Z1231 Encounter for screening mammogram for malignant neoplasm of breast: Secondary | ICD-10-CM

## 2013-12-14 ENCOUNTER — Ambulatory Visit (INDEPENDENT_AMBULATORY_CARE_PROVIDER_SITE_OTHER): Payer: BC Managed Care – PPO | Admitting: Internal Medicine

## 2013-12-14 ENCOUNTER — Encounter: Payer: Self-pay | Admitting: Internal Medicine

## 2013-12-14 DIAGNOSIS — A09 Infectious gastroenteritis and colitis, unspecified: Secondary | ICD-10-CM

## 2013-12-14 DIAGNOSIS — Z23 Encounter for immunization: Secondary | ICD-10-CM

## 2013-12-14 MED ORDER — CIPROFLOXACIN HCL 500 MG PO TABS: 500.0000 mg | ORAL_TABLET | Freq: Two times a day (BID) | ORAL | Status: DC

## 2013-12-14 NOTE — Addendum Note (Signed)
Addended by: Landis Gandy on: 12/14/2013 11:05 AM   Modules accepted: Orders, Medications

## 2013-12-14 NOTE — Progress Notes (Signed)
   Subjective:    Patient ID: Amanda Herring, female    DOB: [birth year], 58 y.o.   MRN: [id number]  HPI 58yo F with history of hypothyroidism, hypertension who is going on a church based medical mission trip to [place] from oct 17th through the 25th. She has previously traveled to [place], and [place]. She has received several of her vaccines already in preparation for her trip.  - hep a immune - hep b immune - currently on 2nd dose of vivotif - flu vaccine on 9/30   Pmhx: htn, hypothyroidism All: codiene Meds: synthorid 77mcg dialy, losartan/hctza, vitamin , caltrate 600mg  with D3,omeprazole, and ambien prn  Review of Systems     Objective:   Physical Exam        Assessment & Plan:  Travelers diarrhea = gave precautions and rx for cipro to use as needed  Malaria prophylaxis = she is planning on starting chloroquine tomorrow to do 2 wk prior to travel, thru visit, and 4 wks afterwards  Vaccine = giving tdap

## 2014-01-16 ENCOUNTER — Ambulatory Visit
Admission: RE | Admit: 2014-01-16 | Discharge: 2014-01-16 | Disposition: A | Discharge status: Home / Self Care | Payer: BC Managed Care – PPO | Source: Ambulatory Visit

## 2014-01-16 DIAGNOSIS — Z1231 Encounter for screening mammogram for malignant neoplasm of breast: Secondary | ICD-10-CM

## 2014-01-18 ENCOUNTER — Other Ambulatory Visit: Payer: Self-pay | Admitting: Obstetrics and Gynecology

## 2014-01-18 DIAGNOSIS — R928 Other abnormal and inconclusive findings on diagnostic imaging of breast: Secondary | ICD-10-CM

## 2014-01-25 ENCOUNTER — Ambulatory Visit
Admission: RE | Admit: 2014-01-25 | Discharge: 2014-01-25 | Disposition: A | Discharge status: Home / Self Care | Payer: BC Managed Care – PPO | Source: Ambulatory Visit | Attending: Obstetrics and Gynecology | Admitting: Obstetrics and Gynecology

## 2014-01-25 DIAGNOSIS — R928 Other abnormal and inconclusive findings on diagnostic imaging of breast: Secondary | ICD-10-CM

## 2014-07-01 ENCOUNTER — Other Ambulatory Visit: Payer: Self-pay | Admitting: Obstetrics and Gynecology

## 2014-07-02 LAB — CYTOLOGY - PAP

## 2015-05-07 ENCOUNTER — Other Ambulatory Visit: Payer: Self-pay

## 2015-05-07 DIAGNOSIS — Z1231 Encounter for screening mammogram for malignant neoplasm of breast: Secondary | ICD-10-CM

## 2015-05-26 ENCOUNTER — Ambulatory Visit: Payer: Self-pay

## 2015-05-26 ENCOUNTER — Ambulatory Visit
Admission: RE | Admit: 2015-05-26 | Discharge: 2015-05-26 | Disposition: A | Discharge status: Home / Self Care | Payer: BLUE CROSS/BLUE SHIELD | Source: Ambulatory Visit

## 2015-05-26 DIAGNOSIS — Z1231 Encounter for screening mammogram for malignant neoplasm of breast: Secondary | ICD-10-CM

## 2015-05-29 ENCOUNTER — Other Ambulatory Visit: Payer: Self-pay | Admitting: Obstetrics and Gynecology

## 2015-05-29 DIAGNOSIS — R928 Other abnormal and inconclusive findings on diagnostic imaging of breast: Secondary | ICD-10-CM

## 2015-06-04 ENCOUNTER — Ambulatory Visit
Admission: RE | Admit: 2015-06-04 | Discharge: 2015-06-04 | Disposition: A | Discharge status: Home / Self Care | Payer: BLUE CROSS/BLUE SHIELD | Source: Ambulatory Visit | Attending: Obstetrics and Gynecology | Admitting: Obstetrics and Gynecology

## 2015-06-04 DIAGNOSIS — R928 Other abnormal and inconclusive findings on diagnostic imaging of breast: Secondary | ICD-10-CM

## 2015-08-25 ENCOUNTER — Encounter: Payer: Self-pay | Admitting: Family Medicine

## 2016-04-28 ENCOUNTER — Other Ambulatory Visit: Payer: Self-pay | Admitting: Obstetrics and Gynecology

## 2016-04-28 DIAGNOSIS — Z1231 Encounter for screening mammogram for malignant neoplasm of breast: Secondary | ICD-10-CM

## 2016-05-26 ENCOUNTER — Ambulatory Visit
Admission: RE | Admit: 2016-05-26 | Discharge: 2016-05-26 | Disposition: A | Discharge status: Home / Self Care | Payer: BLUE CROSS/BLUE SHIELD | Source: Ambulatory Visit | Attending: Obstetrics and Gynecology | Admitting: Obstetrics and Gynecology

## 2016-05-26 DIAGNOSIS — Z1231 Encounter for screening mammogram for malignant neoplasm of breast: Secondary | ICD-10-CM

## 2016-07-15 ENCOUNTER — Emergency Department (HOSPITAL_COMMUNITY)
Admission: EM | Admit: 2016-07-15 | Discharge: 2016-07-16 | Disposition: A | Discharge status: Home / Self Care | Payer: BLUE CROSS/BLUE SHIELD | Attending: Emergency Medicine | Admitting: Emergency Medicine

## 2016-07-15 ENCOUNTER — Emergency Department (HOSPITAL_COMMUNITY): Payer: BLUE CROSS/BLUE SHIELD

## 2016-07-15 ENCOUNTER — Encounter (HOSPITAL_COMMUNITY): Payer: Self-pay | Admitting: Emergency Medicine

## 2016-07-15 DIAGNOSIS — E039 Hypothyroidism, unspecified: Secondary | ICD-10-CM | POA: Diagnosis not present

## 2016-07-15 DIAGNOSIS — N132 Hydronephrosis with renal and ureteral calculous obstruction: Secondary | ICD-10-CM | POA: Insufficient documentation

## 2016-07-15 DIAGNOSIS — I10 Essential (primary) hypertension: Secondary | ICD-10-CM | POA: Insufficient documentation

## 2016-07-15 DIAGNOSIS — N201 Calculus of ureter: Secondary | ICD-10-CM

## 2016-07-15 DIAGNOSIS — R339 Retention of urine, unspecified: Secondary | ICD-10-CM | POA: Diagnosis not present

## 2016-07-15 DIAGNOSIS — R109 Unspecified abdominal pain: Secondary | ICD-10-CM | POA: Diagnosis present

## 2016-07-15 DIAGNOSIS — N200 Calculus of kidney: Secondary | ICD-10-CM

## 2016-07-15 HISTORY — DX: Calculus of kidney: N20.0

## 2016-07-15 HISTORY — DX: Unspecified viral hepatitis C without hepatic coma: B19.20

## 2016-07-15 LAB — COMPREHENSIVE METABOLIC PANEL
ALK PHOS: 58 U/L (ref 38–126)
ALT: 22 U/L (ref 14–54)
AST: 27 U/L (ref 15–41)
Albumin: 4.2 g/dL (ref 3.5–5.0)
Anion gap: 9 (ref 5–15)
BUN: 23 mg/dL — AB (ref 6–20)
CALCIUM: 9.3 mg/dL (ref 8.9–10.3)
CHLORIDE: 105 mmol/L (ref 101–111)
CO2: 27 mmol/L (ref 22–32)
CREATININE: 1.15 mg/dL — AB (ref 0.44–1.00)
GFR calc Af Amer: 58 mL/min — ABNORMAL LOW (ref >60)
GFR, EST NON AFRICAN AMERICAN: 50 mL/min — AB (ref >60)
Glucose, Bld: 123 mg/dL — ABNORMAL HIGH (ref 65–99)
Potassium: 3.7 mmol/L (ref 3.5–5.1)
Sodium: 141 mmol/L (ref 135–145)
Total Bilirubin: 0.6 mg/dL (ref 0.3–1.2)
Total Protein: 7.2 g/dL (ref 6.5–8.1)

## 2016-07-15 LAB — URINALYSIS, ROUTINE W REFLEX MICROSCOPIC
BILIRUBIN URINE: NEGATIVE
Glucose, UA: NEGATIVE mg/dL
Ketones, ur: NEGATIVE mg/dL
Leukocytes, UA: NEGATIVE
Nitrite: NEGATIVE
Protein, ur: NEGATIVE mg/dL
SPECIFIC GRAVITY, URINE: 1.011 (ref 1.005–1.030)
SQUAMOUS EPITHELIAL / LPF: NONE SEEN
pH: 6 (ref 5.0–8.0)

## 2016-07-15 LAB — CBC
HCT: 40.9 % (ref 36.0–46.0)
Hemoglobin: 14.2 g/dL (ref 12.0–15.0)
MCH: 31.7 pg (ref 26.0–34.0)
MCHC: 34.7 g/dL (ref 30.0–36.0)
MCV: 91.3 fL (ref 78.0–100.0)
PLATELETS: 209 10*3/uL (ref 150–400)
RBC: 4.48 MIL/uL (ref 3.87–5.11)
RDW: 13.4 % (ref 11.5–15.5)
WBC: 9.6 10*3/uL (ref 4.0–10.5)

## 2016-07-15 MED ORDER — MORPHINE SULFATE (PF) 4 MG/ML IV SOLN
4.0000 mg | Freq: Once | INTRAVENOUS | Status: AC
  Administered 2016-07-15: 4 mg via INTRAVENOUS
  Filled 2016-07-15: qty 1

## 2016-07-15 MED ORDER — OXYBUTYNIN CHLORIDE 5 MG PO TABS
5.0000 mg | ORAL_TABLET | Freq: Once | ORAL | Status: AC
  Administered 2016-07-16: 5 mg via ORAL
  Filled 2016-07-15: qty 1

## 2016-07-15 MED ORDER — KETOROLAC TROMETHAMINE 30 MG/ML IJ SOLN
30.0000 mg | Freq: Once | INTRAMUSCULAR | Status: AC
  Administered 2016-07-16: 30 mg via INTRAVENOUS
  Filled 2016-07-15: qty 1

## 2016-07-15 MED ORDER — SODIUM CHLORIDE 0.9 % IV BOLUS (SEPSIS)
1000.0000 mL | Freq: Once | INTRAVENOUS | Status: AC
  Administered 2016-07-16: 1000 mL via INTRAVENOUS

## 2016-07-15 MED ORDER — ONDANSETRON HCL 4 MG/2ML IJ SOLN
4.0000 mg | Freq: Once | INTRAMUSCULAR | Status: AC
  Administered 2016-07-15: 4 mg via INTRAVENOUS
  Filled 2016-07-15: qty 2

## 2016-07-15 MED ORDER — HYDROMORPHONE HCL 1 MG/ML IJ SOLN
0.5000 mg | Freq: Once | INTRAMUSCULAR | Status: AC
  Administered 2016-07-16: 0.5 mg via INTRAVENOUS
  Filled 2016-07-15: qty 1

## 2016-07-15 NOTE — ED Provider Notes (Signed)
[facility] DEPT Provider Note   CSN: [id number] Arrival date & time: 07/15/16  2110     History   Chief Complaint Chief Complaint  Patient presents with  . Urinary Retention  . Flank Pain    HPI Amanda Herring is a 61 y.o. female.  61 year old female with past medical history including hypertension, hepatitis C, hypothyroidism, kidney stone who presents with left flank pain and vomiting. 2 days ago, the patient was awakened from sleep in the middle of the night with left flank pain. She has continued to have intermittent left flank pain since that time that radiates around her left side to her abdomen. She has had intermittent nausea and vomiting. She states that it feels like her urethra is spasming and she has been unable to urinate for the past 4 hours. Around 6 PM tonight, her pain became suddenly worse and she began vomiting again. She denies any fevers, diarrhea, or hematuria. She took 400 mg Advil at 5:30 PM with no relief of her symptoms. She has a history of kidney stone approximately 6 years ago but has never had problems since that time.   The history is provided by the patient.  Flank Pain     Past Medical History:  Diagnosis Date  . Hepatitis C   . Hypertension   . Hypothyroidism   . Kidney stone     There are no active problems to display for this patient.   Past Surgical History:  Procedure Laterality Date  . BREAST EXCISIONAL BIOPSY Bilateral    1981/1980  . BREAST LUMPECTOMY Left    1998  . BREAST SURGERY     augmentation    OB History    No data available       Home Medications    Prior to Admission medications   Medication Sig Start Date End Date Taking? Authorizing Provider  b complex vitamins capsule Take 1 capsule by mouth daily.   Yes Historical Provider, MD  Calcium-Vitamin D (CALTRATE 600 PLUS-VIT D PO) Take 1 tablet by mouth daily.   Yes Historical Provider, MD  cetirizine (ZYRTEC) 10 MG tablet Take 10 mg by mouth daily.   Yes  Historical Provider, MD  Estradiol (VAGIFEM) 10 MCG TABS vaginal tablet Place 1 each vaginally as directed.   Yes Historical Provider, MD  ibuprofen (ADVIL,MOTRIN) 200 MG tablet Take 600 mg by mouth every 6 (six) hours as needed for moderate pain.   Yes Historical Provider, MD  levothyroxine (SYNTHROID, LEVOTHROID) 25 MCG tablet Take 25 mcg by mouth daily before breakfast. 25 MG on Monday, Tuesday, Wednesday and Thursday. 50 MG on Friday, Saturday and Sunday   Yes Historical Provider, MD  losartan-hydrochlorothiazide (HYZAAR) 100-25 MG per tablet Take 1 tablet by mouth daily.  12/04/13  Yes Historical Provider, MD  Multiple Vitamins-Minerals (ONE-A-DAY 50 PLUS) TABS Take 1 tablet by mouth every other day.    Yes Historical Provider, MD  OVER THE COUNTER MEDICATION Take 1 capsule by mouth daily.   Yes Historical Provider, MD  POTASSIUM PO Take 1 tablet by mouth daily.   Yes Historical Provider, MD  zolpidem (AMBIEN) 10 MG tablet Take 10 mg by mouth at bedtime as needed for sleep.  11/13/13  Yes Historical Provider, MD  ciprofloxacin (CIPRO) 500 MG tablet Take 1 tablet (500 mg total) by mouth 2 (two) times daily. If needed if 3 loose stools in 24hr. Can stop taking if diarrhea resolves Patient not taking: Reported on 07/15/2016 12/14/13   Caren Griffins  Baxter Flattery, MD    Family History No family history on file.  Social History Social History  Substance Use Topics  . Smoking status: Never Smoker  . Smokeless tobacco: Never Used  . Alcohol use No     Allergies   Codeine   Review of Systems Review of Systems  Genitourinary: Positive for flank pain.   All other systems reviewed and are negative except that which was mentioned in HPI  Physical Exam Updated Vital Signs BP 118/67 (BP Location: Left Arm)   Pulse 60   Temp 97.9 F (36.6 C) (Oral)   Resp 20   Ht 5\' 5"  (1.651 m)   Wt 235 lb (106.6 kg)   SpO2 98%   BMI 39.11 kg/m   Physical Exam  Constitutional: She is oriented to person, place,  and time. She appears well-developed and well-nourished. She appears distressed.  In mild distress due to pain  HENT:  Head: Normocephalic and atraumatic.  Eyes: Conjunctivae are normal.  Neck: Neck supple.  Cardiovascular: Normal rate, regular rhythm and normal heart sounds.   No murmur heard. Pulmonary/Chest: Effort normal and breath sounds normal.  Abdominal: Soft. Bowel sounds are normal. She exhibits no distension. There is no tenderness.  Musculoskeletal: She exhibits no edema.  L CVA tenderness  Neurological: She is alert and oriented to person, place, and time.  Fluent speech  Skin: Skin is warm and dry.  Psychiatric: She has a normal mood and affect. Judgment normal.  Nursing note and vitals reviewed.    ED Treatments / Results  Labs (all labs ordered are listed, but only abnormal results are displayed) Labs Reviewed  URINALYSIS, ROUTINE W REFLEX MICROSCOPIC - Abnormal; Notable for the following:       Result Value   Hgb urine dipstick LARGE (*)    Bacteria, UA RARE (*)    All other components within normal limits  COMPREHENSIVE METABOLIC PANEL - Abnormal; Notable for the following:    Glucose, Bld 123 (*)    BUN 23 (*)    Creatinine, Ser 1.15 (*)    GFR calc non Af Amer 50 (*)    GFR calc Af Amer 58 (*)    All other components within normal limits  CBC    EKG  EKG Interpretation None       Radiology Ct Renal Stone Study  Result Date: 07/15/2016 CLINICAL DATA:  Left flank pain since Tuesday EXAM: CT ABDOMEN AND PELVIS WITHOUT CONTRAST TECHNIQUE: Multidetector CT imaging of the abdomen and pelvis was performed following the standard protocol without IV contrast. COMPARISON:  None. FINDINGS: LOWER CHEST: Lung bases are clear. Included heart size is normal. No pericardial effusion. Partially visualized saline breast implants. HEPATOBILIARY: Right hepatic 15 mm circumscribed hypodense water attenuating lesion consistent with a cyst. No biliary dilatation.  Gallbladder is unremarkable. PANCREAS: Normal. SPLEEN: Normal. ADRENALS/URINARY TRACT: Kidneys are orthotopic. The adrenal glands are unremarkable. Bilateral nephrolithiasis is identified in the mid left kidney in both lower poles. There is moderate left-sided hydroureteronephrosis secondary to a 5 x 6 mm calculus in the distal left ureter adjacent to the left UVJ. Bladder is decompressed in appearance and unremarkable. STOMACH/BOWEL: The stomach, small and large bowel are normal in course and caliber without inflammatory changes. Normal appendix. VASCULAR/LYMPHATIC: Aortoiliac vessels are normal in course and caliber. No lymphadenopathy by CT size criteria. REPRODUCTIVE:  Hysterectomy.  No adnexal mass. OTHER: No intraperitoneal free fluid or free air. Fat containing infraumbilical ventral hernia with the mouth of the hernia  measuring 2.1 x 1.3 cm in transverse by AP dimension. Small fat containing umbilical hernia is also noted. MUSCULOSKELETAL: Nonacute.  Mild disc space narrowing at L5-S1. IMPRESSION: 1. Bilateral nephrolithiasis. 2. Moderate left-sided hydroureteronephrosis secondary to a 5 x 6 mm calculus in the distal left ureter adjacent to the left UVJ. 3. Water attenuating 1.5 cm lesion in the right hepatic lobe likely a cyst. Electronically Signed   By: Ashley Royalty M.D.   On: 07/15/2016 22:53    Procedures Procedures (including critical care time)  Medications Ordered in ED Medications  oxybutynin (DITROPAN) tablet 5 mg (not administered)  ondansetron (ZOFRAN) injection 4 mg (4 mg Intravenous Given 07/15/16 2220)  morphine 4 MG/ML injection 4 mg (4 mg Intravenous Given 07/15/16 2220)  ketorolac (TORADOL) 30 MG/ML injection 30 mg (30 mg Intravenous Given 07/16/16 0014)  sodium chloride 0.9 % bolus 1,000 mL (1,000 mLs Intravenous New Bag/Given 07/16/16 0013)  HYDROmorphone (DILAUDID) injection 0.5 mg (0.5 mg Intravenous Given 07/16/16 0014)     Initial Impression / Assessment and Plan / ED Course  I  have reviewed the triage vital signs and the nursing notes.  Pertinent labs & imaging results that were available during my care of the patient were reviewed by me and considered in my medical decision making (see chart for details).     Pt w/ 2 days of left flank pain that became worse today and associated with bladder spasms and urinary retention. She was in mild distress on exam but afebrile with stable vital signs. Gave morphine for pain and obtained above labs. Labs show hematuria with no evidence of infection, creatinine 1.15, normal CBC. Obtained CT for assessment of size of stone given the length of time with symptoms. CT shows moderate left-sided hydronephrosis with 5x12mm distal ureteral stone near the left UVJ. Discussed with urology, Dr. Noah Delaine, who recommended ditropan and dilaudid and reassessment as he feels the stone is close to passing. I have ordered these medications along with Toradol. I'm signing out to the oncoming provider who will follow-up on the patient's symptoms after receiving medications. If the patient is comfortable on reassessment, I anticipate she will be able to go home with urology follow-up and pain control for home.  Final Clinical Impressions(s) / ED Diagnoses   Final diagnoses:  None    New Prescriptions New Prescriptions   No medications on file     Sharlett Iles, MD 07/16/16 [phone]

## 2016-07-15 NOTE — ED Notes (Addendum)
Pt states that she has been having left flank pain since tues day morning that has been coming and going and pain has worsened.  Pt states that she has not voided since 430pm, and has had 2 episodes of vomiting beginning today denies diarrhea. Pt  Feels as if her urethra has spasms. Pt states that she took advil for pain.

## 2016-07-15 NOTE — ED Triage Notes (Signed)
Pt comes with complaints of left sided flank pain that radiates to the left side of her abdomen.  Pt has not been able to urinate for 4 hours.  Endorses nausea and vomiting.

## 2016-07-15 NOTE — ED Notes (Signed)
Bed: ZP91 Expected date:  Expected time:  Means of arrival:  Comments: [place]??

## 2016-07-16 ENCOUNTER — Encounter (HOSPITAL_COMMUNITY): Payer: Self-pay | Admitting: Emergency Medicine

## 2016-07-16 ENCOUNTER — Emergency Department (HOSPITAL_COMMUNITY)
Admission: EM | Admit: 2016-07-16 | Discharge: 2016-07-16 | Disposition: A | Discharge status: Home / Self Care | Payer: BLUE CROSS/BLUE SHIELD | Source: Home / Self Care | Attending: Emergency Medicine | Admitting: Emergency Medicine

## 2016-07-16 DIAGNOSIS — R112 Nausea with vomiting, unspecified: Secondary | ICD-10-CM

## 2016-07-16 DIAGNOSIS — Z79899 Other long term (current) drug therapy: Secondary | ICD-10-CM

## 2016-07-16 DIAGNOSIS — E039 Hypothyroidism, unspecified: Secondary | ICD-10-CM

## 2016-07-16 DIAGNOSIS — I10 Essential (primary) hypertension: Secondary | ICD-10-CM | POA: Insufficient documentation

## 2016-07-16 DIAGNOSIS — N201 Calculus of ureter: Secondary | ICD-10-CM

## 2016-07-16 MED ORDER — OXYBUTYNIN CHLORIDE 5 MG PO TABS: 5.0000 mg | ORAL_TABLET | Freq: Three times a day (TID) | ORAL | 0 refills | Status: DC | PRN

## 2016-07-16 MED ORDER — ONDANSETRON 8 MG PO TBDP: 8.0000 mg | ORAL_TABLET | Freq: Once | ORAL | Status: DC

## 2016-07-16 MED ORDER — OXYCODONE-ACETAMINOPHEN 5-325 MG PO TABS: 1.0000 | ORAL_TABLET | ORAL | 0 refills | Status: DC | PRN

## 2016-07-16 MED ORDER — SODIUM CHLORIDE 0.9 % IV BOLUS (SEPSIS)
1000.0000 mL | Freq: Once | INTRAVENOUS | Status: AC
  Administered 2016-07-16: 1000 mL via INTRAVENOUS

## 2016-07-16 MED ORDER — TAMSULOSIN HCL 0.4 MG PO CAPS: 0.4000 mg | ORAL_CAPSULE | Freq: Two times a day (BID) | ORAL | 0 refills | Status: DC

## 2016-07-16 MED ORDER — ONDANSETRON 4 MG PO TBDP: 4.0000 mg | ORAL_TABLET | Freq: Three times a day (TID) | ORAL | 0 refills | Status: DC | PRN

## 2016-07-16 MED ORDER — PROMETHAZINE HCL 25 MG/ML IJ SOLN
12.5000 mg | Freq: Once | INTRAMUSCULAR | Status: AC
  Administered 2016-07-16: 12.5 mg via INTRAVENOUS
  Filled 2016-07-16: qty 1

## 2016-07-16 MED ORDER — ONDANSETRON HCL 4 MG/2ML IJ SOLN
4.0000 mg | Freq: Once | INTRAMUSCULAR | Status: AC
  Administered 2016-07-16: 4 mg via INTRAVENOUS
  Filled 2016-07-16: qty 2

## 2016-07-16 NOTE — ED Triage Notes (Signed)
Pt discharged at 3am after being diagnosed with a kidney stone. Pt reports she has been having emesis since then and cannot keep medication down.

## 2016-07-16 NOTE — Discharge Instructions (Signed)
Please read instructions below. Fill your prescriptions from last night's visit and take as prescribed. The zofran is for nausea, you put this under your tongue and let it dissolve.  Continue drinking water as tolerated. Strain your urine to check if you have passed stone. Make the appointment with Urology, as referred, for follow up. Return to the ER for fever, new or worsening symptoms.

## 2016-07-16 NOTE — ED Provider Notes (Signed)
[facility] DEPT Provider Note   CSN: [id number] Arrival date & time: 07/16/16  0917     History   Chief Complaint Chief Complaint  Patient presents with  . Flank Pain  . Emesis    HPI Amanda Herring is a 61 y.o. female.  Patient with recent diagnosis of left ureteral stone during ED visit last night, was discharged this morning at 3 AM with Percocet and Zofran to pass at home. Per CT, stone measured 5x87mm located at the left ureterovesicular junction. Patient reports today with continued nausea and minimal suprapubic abdominal pain. States she went straight home last night and did not fill her prescriptions. She did have previous prescription of PO Zofran however could not keep it down.      Past Medical History:  Diagnosis Date  . Hepatitis C   . Hypertension   . Hypothyroidism   . Kidney stone     There are no active problems to display for this patient.   Past Surgical History:  Procedure Laterality Date  . BREAST EXCISIONAL BIOPSY Bilateral    1981/1980  . BREAST LUMPECTOMY Left    1998  . BREAST SURGERY     augmentation    OB History    No data available       Home Medications    Prior to Admission medications   Medication Sig Start Date End Date Taking? Authorizing Provider  b complex vitamins capsule Take 1 capsule by mouth daily.   Yes Historical Provider, MD  Calcium-Vitamin D (CALTRATE 600 PLUS-VIT D PO) Take 1 tablet by mouth daily.   Yes Historical Provider, MD  cetirizine (ZYRTEC) 10 MG tablet Take 10 mg by mouth daily.   Yes Historical Provider, MD  Estradiol (VAGIFEM) 10 MCG TABS vaginal tablet Place 1 each vaginally as directed.   Yes Historical Provider, MD  ibuprofen (ADVIL,MOTRIN) 200 MG tablet Take 600 mg by mouth every 6 (six) hours as needed for moderate pain.   Yes Historical Provider, MD  levothyroxine (SYNTHROID, LEVOTHROID) 25 MCG tablet Take 25 mcg by mouth daily before breakfast. 25 MG on Monday, Tuesday, Wednesday and  Thursday. 50 MG on Friday, Saturday and Sunday   Yes Historical Provider, MD  losartan-hydrochlorothiazide (HYZAAR) 100-25 MG per tablet Take 1 tablet by mouth daily.  12/04/13  Yes Historical Provider, MD  Multiple Vitamins-Minerals (ONE-A-DAY 50 PLUS) TABS Take 1 tablet by mouth every other day.    Yes Historical Provider, MD  ondansetron (ZOFRAN ODT) 4 MG disintegrating tablet Take 1 tablet (4 mg total) by mouth every 8 (eight) hours as needed for nausea or vomiting. 07/16/16  Yes Sharlett Iles, MD  OVER THE COUNTER MEDICATION Take 1 capsule by mouth daily.   Yes Historical Provider, MD  oxybutynin (DITROPAN) 5 MG tablet Take 1 tablet (5 mg total) by mouth every 8 (eight) hours as needed for bladder spasms. 07/16/16  Yes Sharlett Iles, MD  oxyCODONE-acetaminophen (PERCOCET) 5-325 MG tablet Take 1-2 tablets by mouth every 4 (four) hours as needed. 07/16/16  Yes Wenda Overland Little, MD  POTASSIUM PO Take 1 tablet by mouth daily.   Yes Historical Provider, MD  tamsulosin (FLOMAX) 0.4 MG CAPS capsule Take 1 capsule (0.4 mg total) by mouth 2 (two) times daily. 07/16/16  Yes Wenda Overland Little, MD  zolpidem (AMBIEN) 10 MG tablet Take 10 mg by mouth at bedtime as needed for sleep.  11/13/13  Yes Historical Provider, MD  ciprofloxacin (CIPRO) 500 MG tablet Take 1  tablet (500 mg total) by mouth 2 (two) times daily. If needed if 3 loose stools in 24hr. Can stop taking if diarrhea resolves Patient not taking: Reported on 07/15/2016 12/14/13   Carlyle Basques, MD    Family History History reviewed. No pertinent family history.  Social History Social History  Substance Use Topics  . Smoking status: Never Smoker  . Smokeless tobacco: Never Used  . Alcohol use No     Allergies   Codeine   Review of Systems Review of Systems  Constitutional: Negative for chills and fever.  Respiratory: Negative for shortness of breath.   Cardiovascular: Negative for chest pain.  Gastrointestinal: Positive for  abdominal pain (mild suprapubic), nausea and vomiting.  Genitourinary: Positive for frequency. Negative for dysuria, flank pain and hematuria.     Physical Exam Updated Vital Signs BP 127/68   Pulse (!) 58   Temp 97.6 F (36.4 C) (Oral)   Resp 18   Ht 5\' 5"  (1.651 m)   Wt 106.6 kg   SpO2 98%   BMI 39.11 kg/m   Physical Exam  Constitutional: She is oriented to person, place, and time. She appears well-developed and well-nourished. No distress.  HENT:  Head: Normocephalic and atraumatic.  Eyes: Conjunctivae are normal.  Cardiovascular: Regular rhythm, normal heart sounds and intact distal pulses.  Exam reveals no friction rub.   No murmur heard. Mildly bradycardic  Pulmonary/Chest: Effort normal and breath sounds normal. No respiratory distress. She has no wheezes. She has no rales.  Abdominal: Soft. Bowel sounds are normal. She exhibits no distension. There is no tenderness. There is no rebound and no guarding.  nontender to palpation  Neurological: She is alert and oriented to person, place, and time.  Psychiatric: She has a normal mood and affect. Her behavior is normal.  Nursing note and vitals reviewed.    ED Treatments / Results  Labs (all labs ordered are listed, but only abnormal results are displayed) Labs Reviewed - No data to display  EKG  EKG Interpretation None       Radiology Ct Renal Stone Study  Result Date: 07/15/2016 CLINICAL DATA:  Left flank pain since Tuesday EXAM: CT ABDOMEN AND PELVIS WITHOUT CONTRAST TECHNIQUE: Multidetector CT imaging of the abdomen and pelvis was performed following the standard protocol without IV contrast. COMPARISON:  None. FINDINGS: LOWER CHEST: Lung bases are clear. Included heart size is normal. No pericardial effusion. Partially visualized saline breast implants. HEPATOBILIARY: Right hepatic 15 mm circumscribed hypodense water attenuating lesion consistent with a cyst. No biliary dilatation. Gallbladder is unremarkable.  PANCREAS: Normal. SPLEEN: Normal. ADRENALS/URINARY TRACT: Kidneys are orthotopic. The adrenal glands are unremarkable. Bilateral nephrolithiasis is identified in the mid left kidney in both lower poles. There is moderate left-sided hydroureteronephrosis secondary to a 5 x 6 mm calculus in the distal left ureter adjacent to the left UVJ. Bladder is decompressed in appearance and unremarkable. STOMACH/BOWEL: The stomach, small and large bowel are normal in course and caliber without inflammatory changes. Normal appendix. VASCULAR/LYMPHATIC: Aortoiliac vessels are normal in course and caliber. No lymphadenopathy by CT size criteria. REPRODUCTIVE:  Hysterectomy.  No adnexal mass. OTHER: No intraperitoneal free fluid or free air. Fat containing infraumbilical ventral hernia with the mouth of the hernia measuring 2.1 x 1.3 cm in transverse by AP dimension. Small fat containing umbilical hernia is also noted. MUSCULOSKELETAL: Nonacute.  Mild disc space narrowing at L5-S1. IMPRESSION: 1. Bilateral nephrolithiasis. 2. Moderate left-sided hydroureteronephrosis secondary to a 5 x 6 mm calculus  in the distal left ureter adjacent to the left UVJ. 3. Water attenuating 1.5 cm lesion in the right hepatic lobe likely a cyst. Electronically Signed   By: Ashley Royalty M.D.   On: 07/15/2016 22:53    Procedures Procedures (including critical care time)  Medications Ordered in ED Medications  sodium chloride 0.9 % bolus 1,000 mL (0 mLs Intravenous Stopped 07/16/16 1420)  promethazine (PHENERGAN) injection 12.5 mg (12.5 mg Intravenous Given 07/16/16 1253)     Initial Impression / Assessment and Plan / ED Course  I have reviewed the triage vital signs and the nursing notes.  Pertinent labs & imaging results that were available during my care of the patient were reviewed by me and considered in my medical decision making (see chart for details).     Pt diagnosed with a ureteral stone via CT last night. Pain improved since last  visit without interventions since ED discharge, however she did not fill Rx for zofran last night.  Pt reported today for nausea. Today, pt is afebrile, not in distress. Labs from last night are reassuring; Cr w mild increase from baseline, CBC reassuring. IVF given and phenergan with improvement in nausea. Stone is likely to pass without intervention. Encouraged PO fluids and to fill recent Rx for zofran, oxybutynin and flomax. Pt will be dc home with pain medications & has been advised to follow up with urology.  Patient discussed with Dr. Maryan Rued.  Discussed results, findings, treatment and follow up. Patient advised of return precautions. Patient verbalized understanding and agreed with plan.  Final Clinical Impressions(s) / ED Diagnoses   Final diagnoses:  Ureteral stone  Nausea and vomiting in adult    New Prescriptions Discharge Medication List as of 07/16/2016  2:45 PM           Martinique N Russo, PA-C 07/16/16 1531    Blanchie Dessert, MD 07/17/16 2205

## 2016-07-16 NOTE — ED Notes (Signed)
Pt is having nausea and vomiting, with increased pain.

## 2017-04-18 ENCOUNTER — Other Ambulatory Visit: Payer: Self-pay | Admitting: Obstetrics and Gynecology

## 2017-04-18 DIAGNOSIS — Z1231 Encounter for screening mammogram for malignant neoplasm of breast: Secondary | ICD-10-CM

## 2017-05-30 ENCOUNTER — Ambulatory Visit
Admission: RE | Admit: 2017-05-30 | Discharge: 2017-05-30 | Disposition: A | Discharge status: Home / Self Care | Payer: BLUE CROSS/BLUE SHIELD | Source: Ambulatory Visit | Attending: Obstetrics and Gynecology | Admitting: Obstetrics and Gynecology

## 2017-05-30 DIAGNOSIS — Z1231 Encounter for screening mammogram for malignant neoplasm of breast: Secondary | ICD-10-CM

## 2019-04-11 ENCOUNTER — Other Ambulatory Visit: Payer: Self-pay | Admitting: Family Medicine

## 2019-04-11 ENCOUNTER — Other Ambulatory Visit: Payer: Self-pay | Admitting: Obstetrics and Gynecology

## 2019-04-11 DIAGNOSIS — Z1231 Encounter for screening mammogram for malignant neoplasm of breast: Secondary | ICD-10-CM

## 2019-05-17 ENCOUNTER — Other Ambulatory Visit: Payer: Self-pay

## 2019-05-17 ENCOUNTER — Ambulatory Visit
Admission: RE | Admit: 2019-05-17 | Discharge: 2019-05-17 | Disposition: A | Discharge status: Home / Self Care | Payer: 59 | Source: Ambulatory Visit | Attending: Family Medicine | Admitting: Family Medicine

## 2019-05-17 DIAGNOSIS — Z1231 Encounter for screening mammogram for malignant neoplasm of breast: Secondary | ICD-10-CM

## 2019-05-31 ENCOUNTER — Ambulatory Visit: Payer: 59 | Attending: Internal Medicine

## 2019-05-31 DIAGNOSIS — Z23 Encounter for immunization: Secondary | ICD-10-CM

## 2019-05-31 NOTE — Progress Notes (Signed)
   Covid-19 Vaccination Clinic  Name:  Amanda Herring    MRN: [id number] DOB: 06-13-1955  05/31/2019  Amanda Herring was observed post Covid-19 immunization for 15 minutes without incident. She was provided with Vaccine Information Sheet and instruction to access the V-Safe system.   Amanda Herring was instructed to call 911 with any severe reactions post vaccine: Marland Kitchen Difficulty breathing  . Swelling of face and throat  . A fast heartbeat  . A bad rash all over body  . Dizziness and weakness   Immunizations Administered    Name Date Dose VIS Date Route   Pfizer COVID-19 Vaccine 05/31/2019  3:06 PM 0.3 mL 02/23/2019 Intramuscular   Manufacturer: [place]   Lot: [id number]   [place]: [id number]

## 2019-06-25 ENCOUNTER — Ambulatory Visit: Payer: 59 | Attending: Internal Medicine

## 2019-06-25 DIAGNOSIS — Z23 Encounter for immunization: Secondary | ICD-10-CM

## 2019-06-25 NOTE — Progress Notes (Signed)
   Covid-19 Vaccination Clinic  Name:  Amanda Herring    MRN: [id number] DOB: 1956-03-14  06/25/2019  [name] was observed post Covid-19 immunization for 15 minutes without incident. She was provided with Vaccine Information Sheet and instruction to access the V-Safe system.   [name] was instructed to call 911 with any severe reactions post vaccine: Marland Kitchen Difficulty breathing  . Swelling of face and throat  . A fast heartbeat  . A bad rash all over body  . Dizziness and weakness   Immunizations Administered    Name Date Dose VIS Date Route   Pfizer COVID-19 Vaccine 06/25/2019  3:31 PM 0.3 mL 02/23/2019 Intramuscular   Manufacturer: [employer]   Lot: [id number]   [place]: [id number]

## 2020-04-04 ENCOUNTER — Other Ambulatory Visit: Payer: Self-pay | Admitting: Family Medicine

## 2020-04-04 DIAGNOSIS — Z1231 Encounter for screening mammogram for malignant neoplasm of breast: Secondary | ICD-10-CM

## 2020-05-20 ENCOUNTER — Ambulatory Visit: Payer: 59

## 2020-05-30 ENCOUNTER — Ambulatory Visit: Payer: 59

## 2020-07-18 ENCOUNTER — Ambulatory Visit: Payer: Self-pay

## 2020-07-30 DIAGNOSIS — E039 Hypothyroidism, unspecified: Secondary | ICD-10-CM | POA: Diagnosis not present

## 2020-07-30 DIAGNOSIS — Z1211 Encounter for screening for malignant neoplasm of colon: Secondary | ICD-10-CM | POA: Diagnosis not present

## 2020-07-30 DIAGNOSIS — I10 Essential (primary) hypertension: Secondary | ICD-10-CM | POA: Diagnosis not present

## 2020-07-30 DIAGNOSIS — Z0000 Encounter for general adult medical examination without abnormal findings: Secondary | ICD-10-CM | POA: Diagnosis not present

## 2020-07-30 DIAGNOSIS — E782 Mixed hyperlipidemia: Secondary | ICD-10-CM | POA: Diagnosis not present

## 2020-09-05 ENCOUNTER — Ambulatory Visit: Payer: Self-pay

## 2020-09-08 ENCOUNTER — Other Ambulatory Visit: Payer: Self-pay

## 2020-09-08 ENCOUNTER — Ambulatory Visit
Admission: RE | Admit: 2020-09-08 | Discharge: 2020-09-08 | Disposition: A | Discharge status: Home / Self Care | Payer: Self-pay | Source: Ambulatory Visit | Attending: Family Medicine | Admitting: Family Medicine

## 2020-09-08 DIAGNOSIS — Z1231 Encounter for screening mammogram for malignant neoplasm of breast: Secondary | ICD-10-CM | POA: Diagnosis not present

## 2020-10-30 DIAGNOSIS — H524 Presbyopia: Secondary | ICD-10-CM | POA: Diagnosis not present

## 2020-10-30 DIAGNOSIS — H5203 Hypermetropia, bilateral: Secondary | ICD-10-CM | POA: Diagnosis not present

## 2020-10-30 DIAGNOSIS — H2513 Age-related nuclear cataract, bilateral: Secondary | ICD-10-CM | POA: Diagnosis not present

## 2021-01-13 DIAGNOSIS — Z1211 Encounter for screening for malignant neoplasm of colon: Secondary | ICD-10-CM | POA: Diagnosis not present

## 2021-01-13 DIAGNOSIS — D124 Benign neoplasm of descending colon: Secondary | ICD-10-CM | POA: Diagnosis not present

## 2021-01-16 DIAGNOSIS — D124 Benign neoplasm of descending colon: Secondary | ICD-10-CM | POA: Diagnosis not present

## 2021-02-23 DIAGNOSIS — G4700 Insomnia, unspecified: Secondary | ICD-10-CM | POA: Diagnosis not present

## 2021-02-23 DIAGNOSIS — I10 Essential (primary) hypertension: Secondary | ICD-10-CM | POA: Diagnosis not present

## 2021-09-07 DIAGNOSIS — I10 Essential (primary) hypertension: Secondary | ICD-10-CM | POA: Diagnosis not present

## 2021-09-10 ENCOUNTER — Other Ambulatory Visit: Payer: Self-pay | Admitting: Family Medicine

## 2021-09-10 DIAGNOSIS — E039 Hypothyroidism, unspecified: Secondary | ICD-10-CM | POA: Diagnosis not present

## 2021-09-10 DIAGNOSIS — E782 Mixed hyperlipidemia: Secondary | ICD-10-CM | POA: Diagnosis not present

## 2021-09-10 DIAGNOSIS — R739 Hyperglycemia, unspecified: Secondary | ICD-10-CM | POA: Diagnosis not present

## 2021-09-10 DIAGNOSIS — Z131 Encounter for screening for diabetes mellitus: Secondary | ICD-10-CM | POA: Diagnosis not present

## 2021-09-10 DIAGNOSIS — I10 Essential (primary) hypertension: Secondary | ICD-10-CM | POA: Diagnosis not present

## 2021-09-10 DIAGNOSIS — Z0000 Encounter for general adult medical examination without abnormal findings: Secondary | ICD-10-CM | POA: Diagnosis not present

## 2021-09-10 DIAGNOSIS — F5104 Psychophysiologic insomnia: Secondary | ICD-10-CM | POA: Diagnosis not present

## 2021-09-10 DIAGNOSIS — Z1231 Encounter for screening mammogram for malignant neoplasm of breast: Secondary | ICD-10-CM

## 2021-09-10 DIAGNOSIS — E669 Obesity, unspecified: Secondary | ICD-10-CM | POA: Diagnosis not present

## 2021-09-18 ENCOUNTER — Ambulatory Visit
Admission: RE | Admit: 2021-09-18 | Discharge: 2021-09-18 | Disposition: A | Discharge status: Home / Self Care | Payer: PPO | Source: Ambulatory Visit | Attending: Family Medicine | Admitting: Family Medicine

## 2021-09-18 DIAGNOSIS — Z1231 Encounter for screening mammogram for malignant neoplasm of breast: Secondary | ICD-10-CM | POA: Diagnosis not present

## 2021-09-22 ENCOUNTER — Other Ambulatory Visit: Payer: Self-pay | Admitting: Family Medicine

## 2021-09-22 DIAGNOSIS — R928 Other abnormal and inconclusive findings on diagnostic imaging of breast: Secondary | ICD-10-CM

## 2021-09-28 ENCOUNTER — Ambulatory Visit: Payer: PPO

## 2021-09-28 ENCOUNTER — Other Ambulatory Visit: Payer: Self-pay | Admitting: Family Medicine

## 2021-09-28 ENCOUNTER — Ambulatory Visit
Admission: RE | Admit: 2021-09-28 | Discharge: 2021-09-28 | Disposition: A | Discharge status: Home / Self Care | Payer: PPO | Source: Ambulatory Visit | Attending: Family Medicine | Admitting: Family Medicine

## 2021-09-28 DIAGNOSIS — R928 Other abnormal and inconclusive findings on diagnostic imaging of breast: Secondary | ICD-10-CM

## 2021-10-14 IMAGING — MG DIGITAL SCREENING BREAST BILAT IMPLANT W/ TOMO W/ CAD
8 of 12 series · 8 of 28 positions shown · non-contrast
Comparison: Previous exams.

CLINICAL DATA: Screening.

EXAM:
DIGITAL SCREENING BILATERAL MAMMOGRAM WITH IMPLANTS, CAD AND
TOMOSYNTHESIS
TECHNIQUE: Bilateral screening digital craniocaudal and mediolateral oblique
mammograms were obtained. Bilateral screening digital breast
tomosynthesis was performed. The images were evaluated with
computer-aided detection. Standard and/or implant displaced views
were performed.

[L MLO]
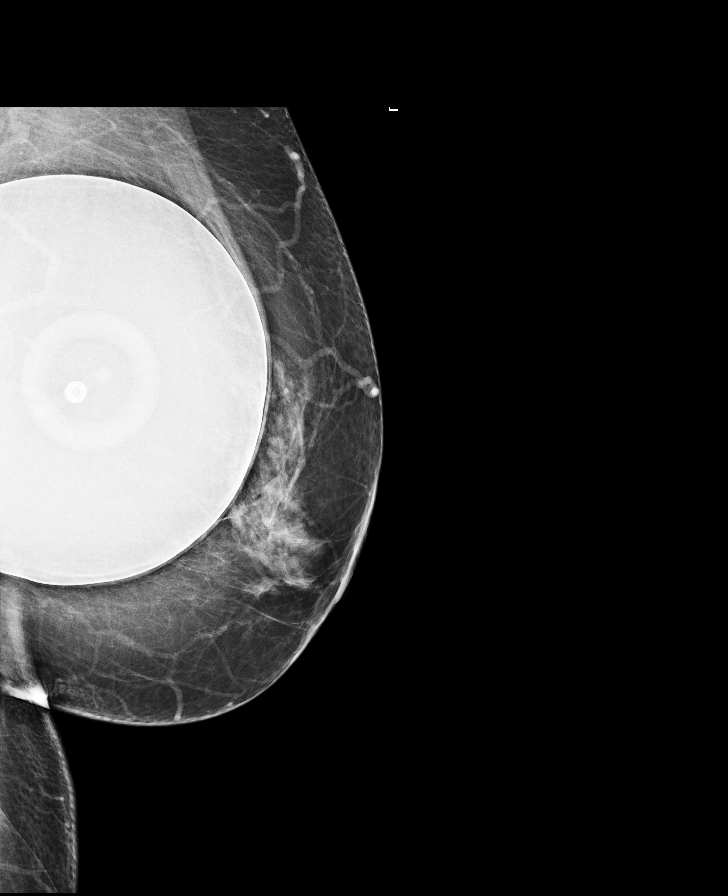

[R CC]
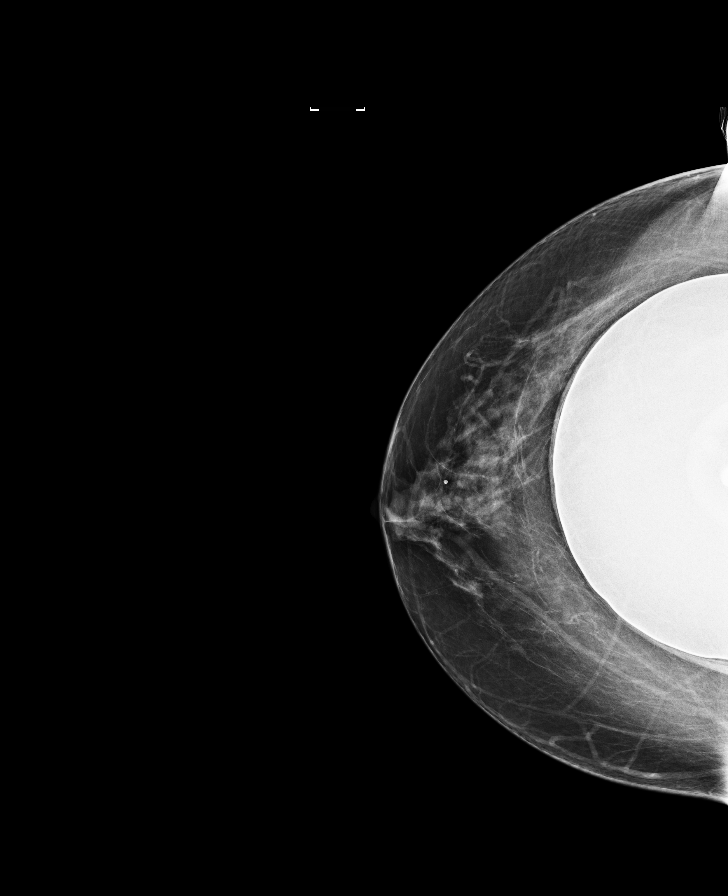

[L CC]
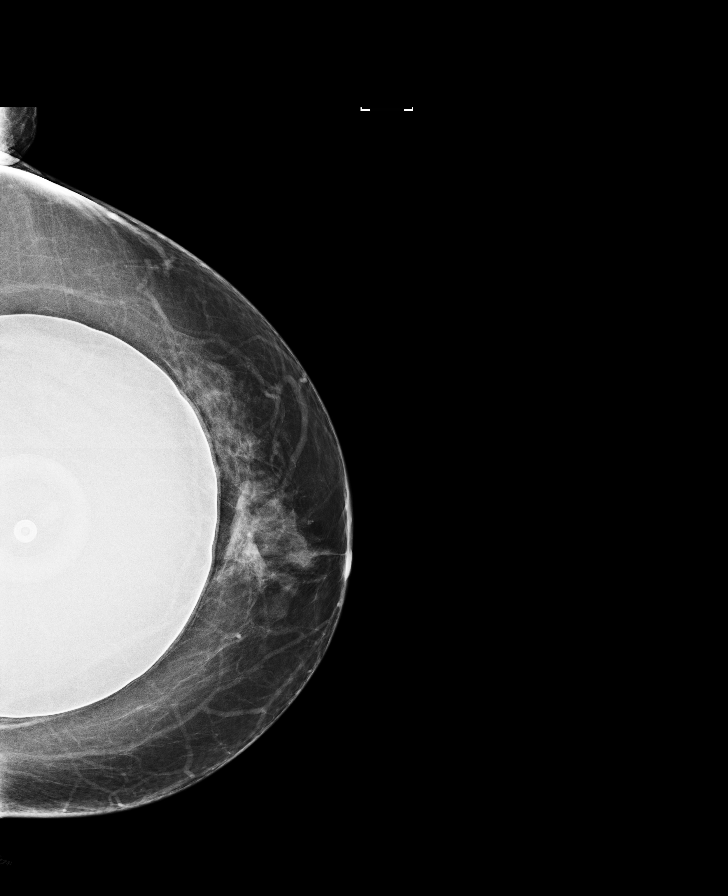

[R MLO]
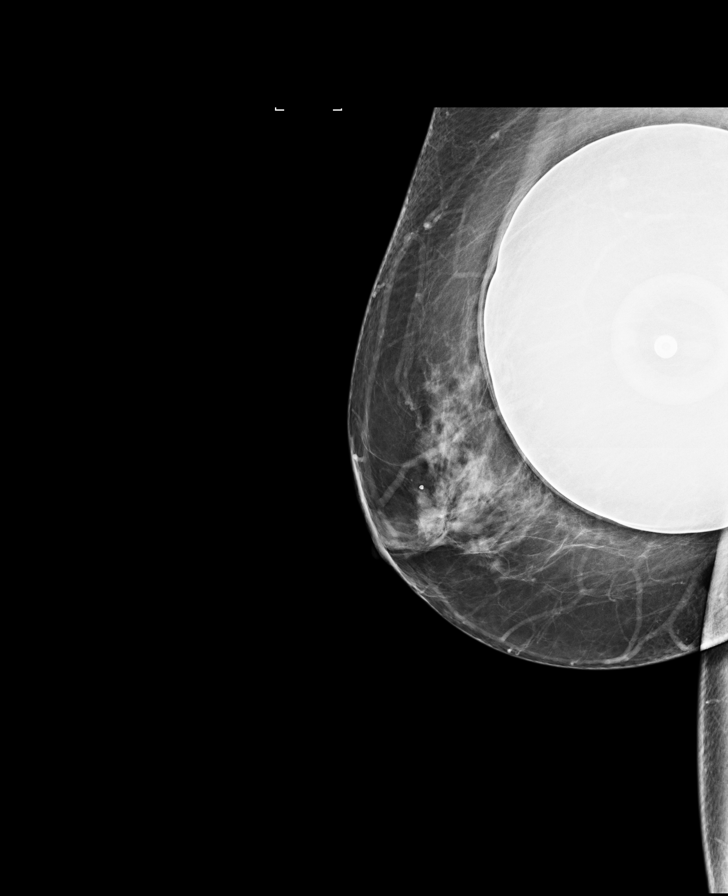

[R CC synth-2D]
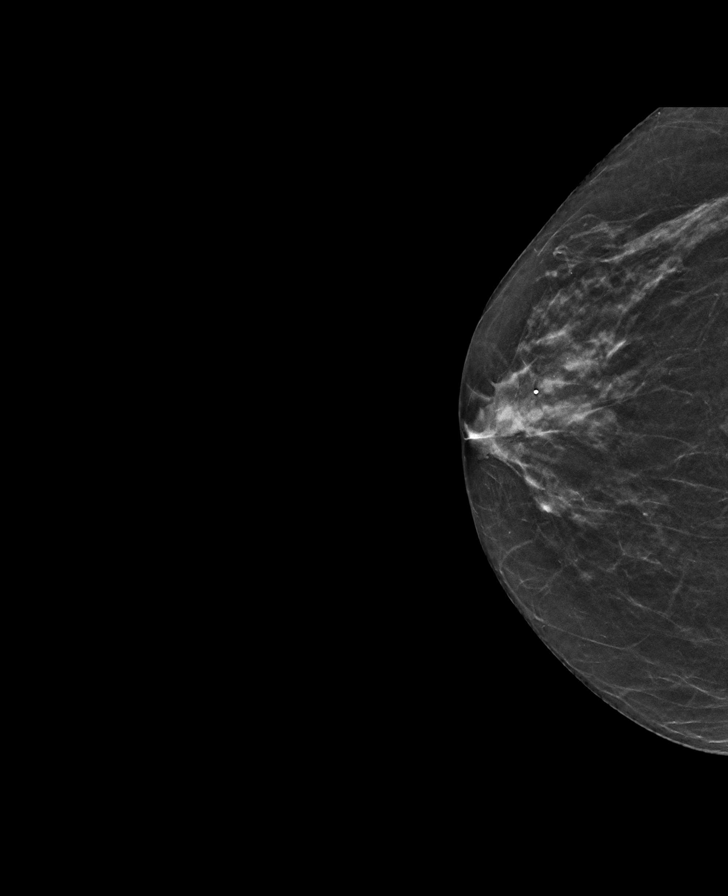

[L MLO synth-2D]
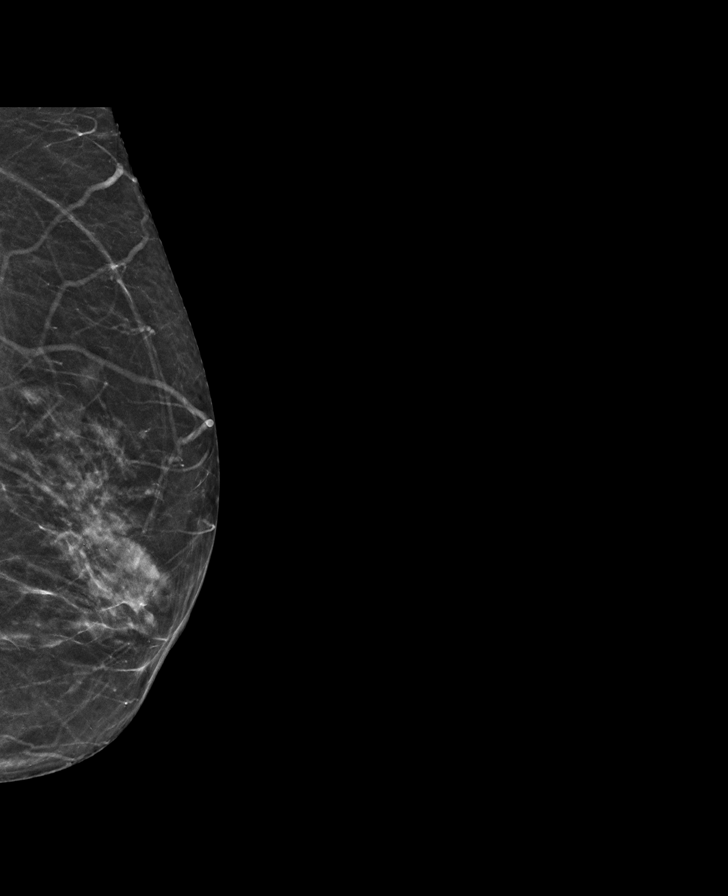

[R MLO synth-2D]
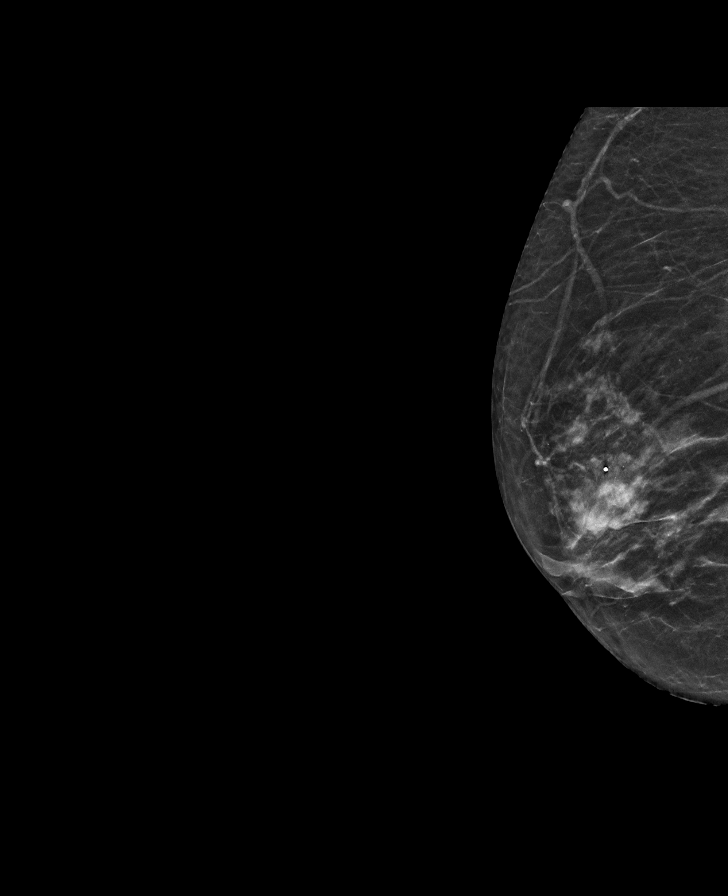

[L CC synth-2D]
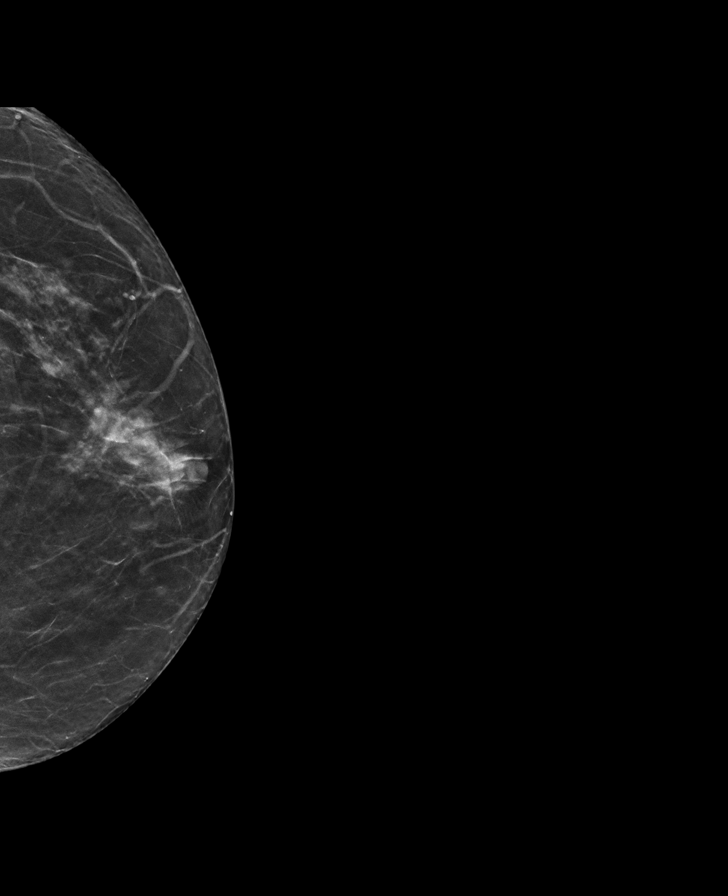

[8 of 28 positions shown; findings below may reference images not displayed]

ACR Breast Density Category b: There are scattered areas of
fibroglandular density.
FINDINGS: The patient has bilateral retropectoral saline implants. There are
no findings suspicious for malignancy.
IMPRESSION: No mammographic evidence of malignancy. A result letter of this
screening mammogram will be mailed directly to the patient.

RECOMMENDATION:
Screening mammogram in one year. (Code:[id number])

BI-RADS CATEGORY  1:  Negative.

## 2021-10-30 DIAGNOSIS — H2513 Age-related nuclear cataract, bilateral: Secondary | ICD-10-CM | POA: Diagnosis not present

## 2021-10-30 DIAGNOSIS — H5203 Hypermetropia, bilateral: Secondary | ICD-10-CM | POA: Diagnosis not present

## 2021-10-30 DIAGNOSIS — H52203 Unspecified astigmatism, bilateral: Secondary | ICD-10-CM | POA: Diagnosis not present

## 2022-03-05 DIAGNOSIS — J3489 Other specified disorders of nose and nasal sinuses: Secondary | ICD-10-CM | POA: Diagnosis not present

## 2022-03-05 DIAGNOSIS — F5104 Psychophysiologic insomnia: Secondary | ICD-10-CM | POA: Diagnosis not present

## 2022-03-05 DIAGNOSIS — I10 Essential (primary) hypertension: Secondary | ICD-10-CM | POA: Diagnosis not present

## 2022-03-21 ENCOUNTER — Other Ambulatory Visit: Payer: Self-pay

## 2022-03-21 ENCOUNTER — Encounter (HOSPITAL_BASED_OUTPATIENT_CLINIC_OR_DEPARTMENT_OTHER): Payer: Self-pay

## 2022-03-21 ENCOUNTER — Emergency Department (HOSPITAL_BASED_OUTPATIENT_CLINIC_OR_DEPARTMENT_OTHER)
Admission: EM | Admit: 2022-03-21 | Discharge: 2022-03-21 | Discharge status: Against Medical Advice | Payer: PPO | Attending: Emergency Medicine | Admitting: Emergency Medicine

## 2022-03-21 DIAGNOSIS — R109 Unspecified abdominal pain: Secondary | ICD-10-CM | POA: Insufficient documentation

## 2022-03-21 DIAGNOSIS — R1110 Vomiting, unspecified: Secondary | ICD-10-CM | POA: Diagnosis not present

## 2022-03-21 DIAGNOSIS — Z5321 Procedure and treatment not carried out due to patient leaving prior to being seen by health care provider: Secondary | ICD-10-CM | POA: Insufficient documentation

## 2022-03-21 NOTE — ED Notes (Signed)
This RN attempted to call patient again for blood collection, no answer

## 2022-03-21 NOTE — ED Notes (Signed)
This RN attempted to call patient from lobby to collect blood specimen, no answer

## 2022-03-21 NOTE — ED Notes (Signed)
Attempted again to locate patient, unable to locate. No answer in lobby.

## 2022-06-12 DIAGNOSIS — N390 Urinary tract infection, site not specified: Secondary | ICD-10-CM | POA: Diagnosis not present

## 2022-09-27 DIAGNOSIS — I10 Essential (primary) hypertension: Secondary | ICD-10-CM | POA: Diagnosis not present

## 2022-09-27 DIAGNOSIS — E782 Mixed hyperlipidemia: Secondary | ICD-10-CM | POA: Diagnosis not present

## 2022-09-27 DIAGNOSIS — E039 Hypothyroidism, unspecified: Secondary | ICD-10-CM | POA: Diagnosis not present

## 2022-09-29 DIAGNOSIS — E039 Hypothyroidism, unspecified: Secondary | ICD-10-CM | POA: Diagnosis not present

## 2022-09-29 DIAGNOSIS — E2839 Other primary ovarian failure: Secondary | ICD-10-CM | POA: Diagnosis not present

## 2022-09-29 DIAGNOSIS — I10 Essential (primary) hypertension: Secondary | ICD-10-CM | POA: Diagnosis not present

## 2022-09-29 DIAGNOSIS — G4700 Insomnia, unspecified: Secondary | ICD-10-CM | POA: Diagnosis not present

## 2022-09-29 DIAGNOSIS — Z6839 Body mass index (BMI) 39.0-39.9, adult: Secondary | ICD-10-CM | POA: Diagnosis not present

## 2022-09-29 DIAGNOSIS — E782 Mixed hyperlipidemia: Secondary | ICD-10-CM | POA: Diagnosis not present

## 2022-09-29 DIAGNOSIS — L989 Disorder of the skin and subcutaneous tissue, unspecified: Secondary | ICD-10-CM | POA: Diagnosis not present

## 2022-09-29 DIAGNOSIS — Z0000 Encounter for general adult medical examination without abnormal findings: Secondary | ICD-10-CM | POA: Diagnosis not present

## 2022-10-04 ENCOUNTER — Other Ambulatory Visit: Payer: Self-pay | Admitting: Family Medicine

## 2022-10-04 DIAGNOSIS — Z1231 Encounter for screening mammogram for malignant neoplasm of breast: Secondary | ICD-10-CM

## 2022-10-06 ENCOUNTER — Other Ambulatory Visit: Payer: Self-pay | Admitting: Family Medicine

## 2022-10-06 DIAGNOSIS — E2839 Other primary ovarian failure: Secondary | ICD-10-CM

## 2022-10-21 ENCOUNTER — Ambulatory Visit
Admission: RE | Admit: 2022-10-21 | Discharge: 2022-10-21 | Disposition: A | Discharge status: Home / Self Care | Payer: PPO | Source: Ambulatory Visit | Attending: Family Medicine | Admitting: Family Medicine

## 2022-10-21 DIAGNOSIS — Z1231 Encounter for screening mammogram for malignant neoplasm of breast: Secondary | ICD-10-CM

## 2022-11-01 DIAGNOSIS — H5203 Hypermetropia, bilateral: Secondary | ICD-10-CM | POA: Diagnosis not present

## 2022-11-01 DIAGNOSIS — H2513 Age-related nuclear cataract, bilateral: Secondary | ICD-10-CM | POA: Diagnosis not present

## 2023-09-29 ENCOUNTER — Other Ambulatory Visit: Payer: Self-pay | Admitting: Family Medicine

## 2023-09-29 DIAGNOSIS — Z1231 Encounter for screening mammogram for malignant neoplasm of breast: Secondary | ICD-10-CM

## 2023-10-25 ENCOUNTER — Other Ambulatory Visit: Payer: Self-pay | Admitting: Family Medicine

## 2023-10-25 ENCOUNTER — Ambulatory Visit
Admission: RE | Admit: 2023-10-25 | Discharge: 2023-10-25 | Disposition: A | Discharge status: Home / Self Care | Source: Ambulatory Visit | Attending: Family Medicine | Admitting: Family Medicine

## 2023-10-25 DIAGNOSIS — Z1231 Encounter for screening mammogram for malignant neoplasm of breast: Secondary | ICD-10-CM

## 2023-10-25 DIAGNOSIS — N6310 Unspecified lump in the right breast, unspecified quadrant: Secondary | ICD-10-CM

## 2023-11-04 ENCOUNTER — Ambulatory Visit
Admission: RE | Admit: 2023-11-04 | Discharge: 2023-11-04 | Disposition: A | Discharge status: Home / Self Care | Source: Ambulatory Visit | Attending: Family Medicine | Admitting: Family Medicine

## 2023-11-04 ENCOUNTER — Other Ambulatory Visit: Payer: Self-pay | Admitting: Family Medicine

## 2023-11-04 DIAGNOSIS — N6310 Unspecified lump in the right breast, unspecified quadrant: Secondary | ICD-10-CM

## 2023-11-04 HISTORY — PX: BREAST BIOPSY: SHX20

## 2023-11-07 LAB — SURGICAL PATHOLOGY

## 2023-11-08 ENCOUNTER — Telehealth: Payer: Self-pay | Admitting: *Deleted

## 2023-11-08 NOTE — Telephone Encounter (Signed)
 Called and spoke to patient regarding [facility] on Sept 3. Patient was able to verbalize back to arrive at 0830 at the [facility]. She knows she is coming to one location and her providers will all see her here. Her patient forms will be emailed to her per request. Left navigator's contact info for any further needs.

## 2023-11-11 ENCOUNTER — Encounter: Payer: Self-pay | Admitting: *Deleted

## 2023-11-11 DIAGNOSIS — Z170 Estrogen receptor positive status [ER+]: Secondary | ICD-10-CM | POA: Insufficient documentation

## 2023-11-16 ENCOUNTER — Inpatient Hospital Stay: Admitting: Licensed Clinical Social Worker

## 2023-11-16 ENCOUNTER — Inpatient Hospital Stay: Admitting: Genetic Counselor

## 2023-11-16 ENCOUNTER — Ambulatory Visit: Attending: General Surgery | Admitting: Physical Therapy

## 2023-11-16 ENCOUNTER — Inpatient Hospital Stay (HOSPITAL_BASED_OUTPATIENT_CLINIC_OR_DEPARTMENT_OTHER): Admitting: Hematology and Oncology

## 2023-11-16 ENCOUNTER — Encounter: Payer: Self-pay | Admitting: Genetic Counselor

## 2023-11-16 ENCOUNTER — Encounter: Payer: Self-pay | Admitting: Physical Therapy

## 2023-11-16 ENCOUNTER — Encounter: Payer: Self-pay | Admitting: *Deleted

## 2023-11-16 ENCOUNTER — Inpatient Hospital Stay: Attending: Hematology and Oncology

## 2023-11-16 ENCOUNTER — Ambulatory Visit
Admission: RE | Admit: 2023-11-16 | Discharge: 2023-11-16 | Disposition: A | Discharge status: Home / Self Care | Source: Ambulatory Visit | Attending: Radiation Oncology | Admitting: Radiation Oncology

## 2023-11-16 VITALS — BP 125/72 | HR 68 | Temp 97.3°F | Resp 17 | Ht 64.96 in | Wt 225.7 lb

## 2023-11-16 DIAGNOSIS — Z170 Estrogen receptor positive status [ER+]: Secondary | ICD-10-CM

## 2023-11-16 DIAGNOSIS — C50411 Malignant neoplasm of upper-outer quadrant of right female breast: Secondary | ICD-10-CM

## 2023-11-16 DIAGNOSIS — Z9882 Breast implant status: Secondary | ICD-10-CM | POA: Diagnosis not present

## 2023-11-16 DIAGNOSIS — Z79899 Other long term (current) drug therapy: Secondary | ICD-10-CM | POA: Diagnosis not present

## 2023-11-16 DIAGNOSIS — R279 Unspecified lack of coordination: Secondary | ICD-10-CM | POA: Diagnosis present

## 2023-11-16 DIAGNOSIS — C50912 Malignant neoplasm of unspecified site of left female breast: Secondary | ICD-10-CM | POA: Insufficient documentation

## 2023-11-16 DIAGNOSIS — M79604 Pain in right leg: Secondary | ICD-10-CM | POA: Diagnosis present

## 2023-11-16 DIAGNOSIS — R293 Abnormal posture: Secondary | ICD-10-CM | POA: Diagnosis present

## 2023-11-16 DIAGNOSIS — M6281 Muscle weakness (generalized): Secondary | ICD-10-CM | POA: Insufficient documentation

## 2023-11-16 DIAGNOSIS — Z8052 Family history of malignant neoplasm of bladder: Secondary | ICD-10-CM

## 2023-11-16 DIAGNOSIS — Z853 Personal history of malignant neoplasm of breast: Secondary | ICD-10-CM | POA: Insufficient documentation

## 2023-11-16 LAB — CBC WITH DIFFERENTIAL (CANCER CENTER ONLY)
Abs Immature Granulocytes: 0.02 K/uL (ref 0.00–0.07)
Basophils Absolute: 0 K/uL (ref 0.0–0.1)
Basophils Relative: 1 %
Eosinophils Absolute: 0.1 K/uL (ref 0.0–0.5)
Eosinophils Relative: 1 %
HCT: 42.5 % (ref 36.0–46.0)
Hemoglobin: 14.6 g/dL (ref 12.0–15.0)
Immature Granulocytes: 0 %
Lymphocytes Relative: 33 %
Lymphs Abs: 2.4 K/uL (ref 0.7–4.0)
MCH: 31.1 pg (ref 26.0–34.0)
MCHC: 34.4 g/dL (ref 30.0–36.0)
MCV: 90.6 fL (ref 80.0–100.0)
Monocytes Absolute: 0.6 K/uL (ref 0.1–1.0)
Monocytes Relative: 9 %
Neutro Abs: 4.1 K/uL (ref 1.7–7.7)
Neutrophils Relative %: 56 %
Platelet Count: 213 K/uL (ref 150–400)
RBC: 4.69 MIL/uL (ref 3.87–5.11)
RDW: 13.2 % (ref 11.5–15.5)
WBC Count: 7.2 K/uL (ref 4.0–10.5)
nRBC: 0 % (ref 0.0–0.2)

## 2023-11-16 LAB — CMP (CANCER CENTER ONLY)
ALT: 16 U/L (ref 0–44)
AST: 19 U/L (ref 15–41)
Albumin: 4.3 g/dL (ref 3.5–5.0)
Alkaline Phosphatase: 50 U/L (ref 38–126)
Anion gap: 6 (ref 5–15)
BUN: 25 mg/dL — ABNORMAL HIGH (ref 8–23)
CO2: 31 mmol/L (ref 22–32)
Calcium: 9.8 mg/dL (ref 8.9–10.3)
Chloride: 103 mmol/L (ref 98–111)
Creatinine: 0.73 mg/dL (ref 0.44–1.00)
GFR, Estimated: >60 mL/min (ref >60)
Glucose, Bld: 89 mg/dL (ref 70–99)
Potassium: 3.9 mmol/L (ref 3.5–5.1)
Sodium: 140 mmol/L (ref 135–145)
Total Bilirubin: 0.7 mg/dL (ref 0.0–1.2)
Total Protein: 7.6 g/dL (ref 6.5–8.1)

## 2023-11-16 LAB — GENETIC SCREENING ORDER

## 2023-11-16 NOTE — Research (Signed)
 [employer] 2021-05 - Specimen Collection Study to Evaluate Biomarkers in Subjects with Cancer     Patient Amanda Herring was identified by Dr.Iruku as a potential candidate for the above listed study.  This Clinical Research Coordinator met with Amanda Herring, [id number], on 11/16/23 in a manner and location that ensures patient privacy to discuss participation in the above listed research study.  Patient is Accompanied by daughter.  A copy of the informed consent document with embedded HIPAA language was provided to the patient.  Patient reads, speaks, and understands [place].   Patient was provided with the business card of this Coordinator and encouraged to contact the research team with any questions.  Approximately 10 minutes were spent with the patient reviewing the informed consent documents.  Patient was provided the option of taking informed consent documents home to review and was encouraged to review at their convenience with their support network, including other care providers. Patient took the consent documents home to review. Will follow patient about study interest.  Amanda Herring, MPH  Clinical Research Coordinator

## 2023-11-16 NOTE — Assessment & Plan Note (Signed)
 Assessment and Plan Assessment & Plan Invasive lobular carcinoma of left breast, stage 1, grade 2, ER+ Invasive lobular carcinoma, 1.9 cm, ER+, weak PR+, low Ki-67 index.  - Send tumor specimen for Oncotype DX testing. We have discussed about Oncotype Dx score which is a well validated prognostic scoring system which can predict outcome with endocrine therapy alone and whether chemotherapy reduces recurrence.  Typically in patients with ER positive cancers that are node negative if the RS score is high typically greater than or equal to 26, chemotherapy is recommended.  If chemotherapy is needed, this will precede radiation and then after radiation she will continue on antiestrogen therapy.  PLAN  - Evaluate Oncotype DX results for chemotherapy necessity. - Refer to radiation oncology for post-surgical radiation therapy. - Initiate endocrine therapy with antiestrogen post-radiation. Discussed about anti estogen therapy. We have discussed options for antiestrogen therapy today. With regards to aromatase inhibitors, we discussed mechanism of action, adverse effects including but not limited to post menopausal symptoms, arthralgias, myalgias, increased risk of cardiovascular events and bone loss.  - Monitor bone density biennially during endocrine therapy. - Advise on exercises and vitamin D supplementation for bone density.  Amber Stalls MD

## 2023-11-16 NOTE — Progress Notes (Signed)
 Radiation Oncology         [phone] ________________________________  Name: Amanda Herring        MRN: [id number]  Date of Service: 11/16/2023 DOB: [birth year]  RR:Yjbzd, Anthony RAMAN, FNP  Aron Shoulders, MD     REFERRING PHYSICIAN: Aron Shoulders, MD   DIAGNOSIS: The encounter diagnosis was Malignant neoplasm of upper-outer quadrant of right breast in female, estrogen receptor positive (HCC).   HISTORY OF PRESENT ILLNESS: Amanda Herring is a 68 y.o. female seen in the multidisciplinary breast clinic for a new diagnosis of right breast cancer. 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 She has bilateral breast implants. She recently noted a palpable mass in the right breast and proceeded with diagnostic work up that showed a mass in the right breast. By ultrasound on 11/04/23 this was located in the 12:00 position measuring up to 1.9 cm, and her axilla was negative for adenopathy. 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 She is seen today to discuss treatment recommendations.     PREVIOUS RADIATION THERAPY: No   PAST MEDICAL HISTORY:  Past Medical History:  Diagnosis Date   Hepatitis C    Hypertension    Hypothyroidism    Kidney stone        PAST SURGICAL HISTORY: Past Surgical History:  Procedure Laterality Date   BREAST BIOPSY Right 11/04/2023   US  RT BREAST BX W LOC DEV 1ST LESION IMG BX SPEC US  GUIDE 11/04/2023 [facility] MAMMOGRAPHY   BREAST EXCISIONAL BIOPSY Bilateral    1981/1980   BREAST LUMPECTOMY Left    1998   BREAST SURGERY     augmentation     FAMILY HISTORY:  Family History  Problem Relation Age of Onset   Breast cancer Neg Hx      SOCIAL HISTORY:  reports that she has never smoked. She has never used smokeless tobacco. She reports that she  does not drink alcohol and does not use drugs. The patient is divorced and lives in [place]. She is accompanied by her daughter. She is retired from being an Print production planner in health care. She enjoys keeping her 80 month old grand daughter most days of the week.    ALLERGIES: Codeine and Dilaudid  [hydromorphone  hcl]   MEDICATIONS:  Current Outpatient Medications  Medication Sig Dispense Refill   b complex vitamins capsule Take 1 capsule by mouth daily.     Calcium-Vitamin D (CALTRATE 600 PLUS-VIT D PO) Take 1 tablet by mouth daily.     cetirizine (ZYRTEC) 10 MG tablet Take 10 mg by mouth daily.     ciprofloxacin  (CIPRO ) 500 MG tablet Take 1 tablet (500 mg total) by mouth 2 (two) times daily. If needed if 3 loose stools in 24hr. Can stop taking if diarrhea resolves (Patient not taking: Reported on 07/15/2016) 10 tablet 0   Estradiol (VAGIFEM) 10 MCG TABS vaginal tablet Place 1 each vaginally as directed.     ibuprofen (ADVIL,MOTRIN) 200 MG tablet Take 600 mg by mouth every 6 (six) hours as needed for moderate pain.     levothyroxine (SYNTHROID, LEVOTHROID) 25 MCG tablet Take 25 mcg by mouth daily before breakfast. 25 MG on Monday, Tuesday, Wednesday and Thursday. 50 MG on Friday, Saturday and Sunday  losartan-hydrochlorothiazide (HYZAAR) 100-25 MG per tablet Take 1 tablet by mouth daily.      Multiple Vitamins-Minerals (ONE-A-DAY 50 PLUS) TABS Take 1 tablet by mouth every other day.      ondansetron  (ZOFRAN  ODT) 4 MG disintegrating tablet Take 1 tablet (4 mg total) by mouth every 8 (eight) hours as needed for nausea or vomiting. 8 tablet 0   OVER THE COUNTER MEDICATION Take 1 capsule by mouth daily.     oxybutynin  (DITROPAN ) 5 MG tablet Take 1 tablet (5 mg total) by mouth every 8 (eight) hours as needed for bladder spasms. 8 tablet 0   oxyCODONE -acetaminophen  (PERCOCET) 5-325 MG tablet Take 1-2 tablets by mouth every 4 (four) hours as needed. 12 tablet 0   POTASSIUM PO Take 1 tablet by mouth  daily.     tamsulosin  (FLOMAX ) 0.4 MG CAPS capsule Take 1 capsule (0.4 mg total) by mouth 2 (two) times daily. 10 capsule 0   zolpidem (AMBIEN) 10 MG tablet Take 10 mg by mouth at bedtime as needed for sleep.      No current facility-administered medications for this encounter.     REVIEW OF SYSTEMS: On review of systems, the patient reports that she is doing well overall but is glad to hear this is an earlier cancer. She noticed fluid filled cystic changes over time but more recently felt the area as firm and slightly smaller. No other complaints are verbalized.      PHYSICAL EXAM:  Wt Readings from Last 3 Encounters:  11/16/23 225 lb 11.2 oz (102.4 kg)  03/21/22 223 lb (101.2 kg)  07/16/16 235 lb (106.6 kg)   Temp Readings from Last 3 Encounters:  11/16/23 (!) 97.3 F (36.3 C) (Temporal)  03/21/22 98.7 F (37.1 C)  07/16/16 97.6 F (36.4 C) (Oral)   BP Readings from Last 3 Encounters:  11/16/23 125/72  03/21/22 121/82  07/16/16 127/68   Pulse Readings from Last 3 Encounters:  11/16/23 68  03/21/22 81  07/16/16 (!) 58    In general this is a well appearing caucasian female in no acute distress. She's alert and oriented x4 and appropriate throughout the examination. Cardiopulmonary assessment is negative for acute distress and she exhibits normal effort. Bilateral breast exam is deferred.    ECOG = 1  0 - Asymptomatic (Fully active, able to carry on all predisease activities without restriction)  1 - Symptomatic but completely ambulatory (Restricted in physically strenuous activity but ambulatory and able to carry out work of a light or sedentary nature. For example, light housework, office work)  2 - Symptomatic, <50% in bed during the day (Ambulatory and capable of all self care but unable to carry out any work activities. Up and about more than 50% of waking hours)  3 - Symptomatic, >50% in bed, but not bedbound (Capable of only limited self-care, confined to bed  or chair 50% or more of waking hours)  4 - Bedbound (Completely disabled. Cannot carry on any self-care. Totally confined to bed or chair)  5 - Death   Raylene MM, Creech RH, Tormey DC, et al. [phone]). Toxicity and response criteria of the [facility]. Am. DOROTHA Bridges. Oncol. 5 (6): 649-55    LABORATORY DATA:  Lab Results  Component Value Date   WBC 9.6 07/15/2016   HGB 14.2 07/15/2016   HCT 40.9 07/15/2016   MCV 91.3 07/15/2016   PLT 209 07/15/2016   Lab Results  Component Value Date   NA 141 07/15/2016  K 3.7 07/15/2016   CL 105 07/15/2016   CO2 27 07/15/2016   Lab Results  Component Value Date   ALT 22 07/15/2016   AST 27 07/15/2016   ALKPHOS 58 07/15/2016   BILITOT 0.6 07/15/2016      RADIOGRAPHY: US  RT BREAST BX W LOC DEV 1ST LESION IMG BX SPEC US  GUIDE Addendum Date: 11/07/2023 ADDENDUM REPORT: 11/07/2023 13:15 ADDENDUM: PATHOLOGY revealed: 1. Breast, right, needle core biopsy, 12 o'clock : INVASIVE MAMMARY CARCINOMA, OVERALL GRADE: 2. LYMPHOVASCULAR INVASION: NOT IDENTIFIED CANCER LENGTH: 1.1 CM. - CALCIFICATIONS: NOT IDENTIFIED Pathology results are CONCORDANT with imaging findings, per Dr. Dina Arceo. Pathology results and recommendations were discussed with patient via telephone on 11/07/2023 by Rock Hover RN. Patient reported biopsy site doing well with no adverse symptoms, and slight tenderness at the site. Post biopsy care instructions were reviewed, questions were answered and my direct phone number was provided. Patient was instructed to call [facility] for any additional questions or concerns related to biopsy site. RECOMMENDATIONS: 1. Surgical and oncological consultation. Patient was referred to [facility] with appointment on 11/16/2023. Pathology results reported by Rock Hover RN on 11/07/2023. Electronically Signed   By: Dina  Arceo M.D.   On:  11/07/2023 13:15   Result Date: 11/07/2023 CLINICAL DATA:  Suspicious right breast mass. EXAM: ULTRASOUND GUIDED RIGHT BREAST CORE NEEDLE BIOPSY COMPARISON:  Previous exam(s). PROCEDURE: I met with the patient and we discussed the procedure of ultrasound-guided biopsy, including benefits and alternatives. We discussed the high likelihood of a successful procedure. We discussed the risks of the procedure, including infection, bleeding, tissue injury, clip migration, and inadequate sampling. Informed written consent was given. The usual time-out protocol was performed immediately prior to the procedure. Lesion quadrant: 12 o'clock retroareolar Using sterile technique and 1% lidocaine and 1% lidocaine with epinephrine as local anesthetic, under direct ultrasound visualization, a 14 gauge spring-loaded device was used to perform biopsy of a mass in the 12 o'clock retroareolar region of the right breast using a lateral to medial approach. At the conclusion of the procedure ribbon shaped tissue marker clip was deployed into the biopsy cavity. Follow up 2 view mammogram was performed and dictated separately. IMPRESSION: Ultrasound guided biopsy of the right breast. No apparent complications. Electronically Signed: By: Dina  Arceo M.D. On: 11/07/2023 05:08   MM CLIP PLACEMENT RIGHT Result Date: 11/04/2023 CLINICAL DATA:  Status post ultrasound-guided core biopsy of a right breast mass. EXAM: 3D DIAGNOSTIC RIGHT MAMMOGRAM POST ULTRASOUND BIOPSY COMPARISON:  Previous exam(s). ACR Breast Density Category b: There are scattered areas of fibroglandular density. FINDINGS: 3D Mammographic images were obtained following ultrasound guided biopsy of the right breast. The biopsy marking clip is in expected location in the 12 o'clock retroareolar region of the right breast. IMPRESSION: Appropriate positioning of the ribbon shaped biopsy marking clip at the site of biopsy in the 12 o'clock retroareolar region of the right breast.  Final Assessment: Post Procedure Mammograms for Marker Placement Electronically Signed   By: Harrie Deis M.D.   On: 11/04/2023 11:24   MM 3D DIAGNOSTIC MAMMOGRAM BILATERAL BREAST W/IMPLANT Result Date: 11/04/2023 CLINICAL DATA:  Patient presents with a palpable right breast lump. She reports that she has had this lump, but that it is increased in size and become firmer. In 2015, the patient was assessed for a retroareolar mass, with ultrasound showing a 5 mm simple appearing cyst. Currently, these images  are not available for direct review. EXAM: DIGITAL DIAGNOSTIC BILATERAL MAMMOGRAM WITH IMPLANTS, TOMOSYNTHESIS AND CAD; ULTRASOUND RIGHT BREAST LIMITED TECHNIQUE: Bilateral digital diagnostic mammography and breast tomosynthesis was performed. Standard and/or implant displaced views were performed. The images were evaluated with computer-aided detection. ; Targeted ultrasound examination of the right breast was performed COMPARISON:  Previous exam(s). ACR Breast Density Category b: There are scattered areas of fibroglandular density. FINDINGS: There is a partly obscured oval mass in the retroareolar right breast corresponding to the palpable lump, measuring approximately 1.2 cm in size. There is a small stable mass in the lateral retroareolar left breast. There no other defined masses, no areas of architectural distortion and no suspicious calcifications. The patient has retropectoral saline implants. On physical exam, there is a firm superficial palpable mass in the 12 o'clock position of the right breast. Targeted ultrasound is performed, showing a cystic and solid mass, predominantly solid, hypoechoic and superficial in the 12 o'clock position of the retroareolar right breast, corresponding to the lump. This measures 1.9 x 1.4 x 1.7 cm. There is internal blood flow within the solid components on color Doppler analysis. Margins are mostly ill-defined. Sonographic imaging of the right axilla demonstrates normal  lymph nodes. No enlarged or abnormal nodes. IMPRESSION: 1. Suspicious 1.9 cm solid and cystic mass in the 12 o'clock retroareolar right breast. Tissue sampling is indicated. RECOMMENDATION: 1. Ultrasound-guided core needle biopsy of the cystic and solid mass in the 12 o'clock retroareolar right breast. This procedure was scheduled prior to the patient leaving the Breast Center. I have discussed the findings and recommendations with the patient. If applicable, a reminder letter will be sent to the patient regarding the next appointment. BI-RADS CATEGORY  4: Suspicious. Electronically Signed   By: Alm Parkins M.D.   On: 11/04/2023 08:32   US  LIMITED ULTRASOUND INCLUDING AXILLA RIGHT BREAST Result Date: 11/04/2023 CLINICAL DATA:  Patient presents with a palpable right breast lump. She reports that she has had this lump, but that it is increased in size and become firmer. In 2015, the patient was assessed for a retroareolar mass, with ultrasound showing a 5 mm simple appearing cyst. Currently, these images are not available for direct review. EXAM: DIGITAL DIAGNOSTIC BILATERAL MAMMOGRAM WITH IMPLANTS, TOMOSYNTHESIS AND CAD; ULTRASOUND RIGHT BREAST LIMITED TECHNIQUE: Bilateral digital diagnostic mammography and breast tomosynthesis was performed. Standard and/or implant displaced views were performed. The images were evaluated with computer-aided detection. ; Targeted ultrasound examination of the right breast was performed COMPARISON:  Previous exam(s). ACR Breast Density Category b: There are scattered areas of fibroglandular density. FINDINGS: There is a partly obscured oval mass in the retroareolar right breast corresponding to the palpable lump, measuring approximately 1.2 cm in size. There is a small stable mass in the lateral retroareolar left breast. There no other defined masses, no areas of architectural distortion and no suspicious calcifications. The patient has retropectoral saline implants. On physical  exam, there is a firm superficial palpable mass in the 12 o'clock position of the right breast. Targeted ultrasound is performed, showing a cystic and solid mass, predominantly solid, hypoechoic and superficial in the 12 o'clock position of the retroareolar right breast, corresponding to the lump. This measures 1.9 x 1.4 x 1.7 cm. There is internal blood flow within the solid components on color Doppler analysis. Margins are mostly ill-defined. Sonographic imaging of the right axilla demonstrates normal lymph nodes. No enlarged or abnormal nodes. IMPRESSION: 1. Suspicious 1.9 cm solid and cystic mass in the 12  o'clock retroareolar right breast. Tissue sampling is indicated. RECOMMENDATION: 1. Ultrasound-guided core needle biopsy of the cystic and solid mass in the 12 o'clock retroareolar right breast. This procedure was scheduled prior to the patient leaving the Breast Center. I have discussed the findings and recommendations with the patient. If applicable, a reminder letter will be sent to the patient regarding the next appointment. BI-RADS CATEGORY  4: Suspicious. Electronically Signed   By: Alm Parkins M.D.   On: 11/04/2023 08:32       IMPRESSION/PLAN: 1. Stage IA, [id number] grade 2 ER/PR positive invasive lobular carcinoma of the right breast. Dr. Dewey discusses the pathology findings and reviews the nature of early stage right breast disease. The consensus from the breast conference includes MRI for extent of disease followed by breast conservation with lumpectomy with sentinel node biopsy. Dr. Loretha anticipates Oncotype Dx score to determine a role for systemic therapy. Provided that chemotherapy is not indicated, the patient's course would then be followed by external radiotherapy to the breast  to reduce risks of local recurrence. Dr. Loretha anticipates adjuvant antiestrogen therapy to follow. We discussed the risks, benefits, short, and long term effects of radiotherapy, as well as the curative  intent, and the patient is interested in proceeding. Dr. Dewey discusses the delivery and logistics of radiotherapy and anticipates a course of 6 1/2 weeks of radiotherapy to the right breast given her in situ implants. We will see her back a few weeks after surgery to discuss the simulation process and anticipate we starting radiotherapy about 4-6 weeks after surgery.  2. In Situ Breast Implants. Given the history of breast augmentation, we discussed the possibility of implant failure following radiotherapy and as above would treat over 6 1/2 weeks to reduce risks of this.  3. History of left breast DCIS. The patient will continue follow up for this when she begins surveillance for #1.  4. Possible genetic predisposition to malignancy. The patient is a candidate for genetic testing given her  personal  history. She will meet with our geneticist today in clinic.   In a visit lasting 60 minutes, greater than 50% of the time was spent face to face reviewing her case, as well as in preparation of, discussing, and coordinating the patient's care.  The above documentation reflects my direct findings during this shared patient visit. Please see the separate note by Dr. Dewey on this date for the remainder of the patient's plan of care.    Donald KYM Husband, [facility]    **Disclaimer: This note was dictated with voice recognition software. Similar sounding words can inadvertently be transcribed and this note may contain transcription errors which may not have been corrected upon publication of note.**

## 2023-11-16 NOTE — Progress Notes (Signed)
 [facility] Clinical Social Work  Initial Assessment   Amanda Herring is a 68 y.o. year old female accompanied by daughter, [name]. Clinical Social Work was referred by [facility] for assessment of psychosocial needs.  CSW M.Zavala present at visit.  SDOH (Social Determinants of Health) assessments performed: Yes SDOH Interventions    Flowsheet Row Clinical Support from 11/16/2023 in [facility] Dept Of [place]  SDOH Interventions   Food Insecurity Interventions Intervention Not Indicated  Housing Interventions Intervention Not Indicated  Transportation Interventions Intervention Not Indicated  Utilities Interventions Intervention Not Indicated    SDOH Screenings   Food Insecurity: No Food Insecurity (11/16/2023)  Housing: Low Risk  (11/16/2023)  Transportation Needs: No Transportation Needs (11/16/2023)  Utilities: Not At Risk (11/16/2023)  Depression (PHQ2-9): Low Risk  (11/16/2023)  Tobacco Use: Low Risk  (11/16/2023)   Received from [facility]    PHQ 2/9:    11/16/2023    2:28 PM  Depression screen PHQ 2/9  Decreased Interest 0  Down, Depressed, Hopeless 0  PHQ - 2 Score 0     Distress Screen completed: No     No data to display            Family/Social Information:  Housing Arrangement: patient lives alone and has two dogs. Family members/support persons in your life? Family and Friends. Three adult children live nearby, son lives next door. Transportation concerns: no  Employment: Retired .  Income source: Actor concerns: No Type of concern: None Food access concerns: no Religious or spiritual practice: Yes-attends church virtually Advanced directives: No Services Currently in place:  [employer]  Coping/ Adjustment to diagnosis: Patient understands treatment plan and what happens next? yes Concerns about diagnosis and/or treatment: I'm not especially worried about  anything Hopes and/or priorities: Pt really enjoys caring for her 89 month old granddaughter. Patient enjoys gardening, sewing/ knitting, and walking. Current coping skills/ strengths: Ability for insight , Capable of independent living , Communication skills , Religious Affiliation , and Supportive family/friends     SUMMARY: Current SDOH Barriers:  No major barriers identified today.  Clinical Social Work Clinical Goal(s):  No clinical social work goals at this time  Interventions: Discussed common feeling and emotions when being diagnosed with cancer, and the importance of support during treatment Informed patient of the support team roles and support services at [facility] Provided CSW contact information and encouraged patient to call with any questions or concerns   Follow Up Plan: Patient will contact CSW with any support or resource needs Patient verbalizes understanding of plan: Yes    Thersia KATHEE Daring Clinical Social Worker [facility]

## 2023-11-16 NOTE — Progress Notes (Signed)
 REFERRING PROVIDER: Aron Shoulders, MD [address] [place],  [place] [address]  PRIMARY PROVIDER:  Dyane Anthony RAMAN, FNP  PRIMARY REASON FOR VISIT:  1. Family history of bladder cancer   2. Malignant neoplasm of upper-outer quadrant of right breast in female, estrogen receptor positive (HCC)   3. Personal history of breast cancer      HISTORY OF PRESENT ILLNESS:   Amanda Herring, a 68 y.o. female, was seen for a [facility] cancer genetics consultation at the request of Dr. Aron due to a personal and family history of cancer.  Amanda Herring presents to clinic today to discuss the possibility of a hereditary predisposition to cancer, genetic testing, and to further clarify her future cancer risks, as well as potential cancer risks for family members.   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  In 2025, at the age of 11, Amanda Herring was diagnosed with cancer in the right breast.  She has not had genetic testing, however, her daughter reports having negative genetic testing. We do not have that report.SABRA    CANCER HISTORY:  Oncology History  Malignant neoplasm of upper-outer quadrant of right breast in female, estrogen receptor positive (HCC)  11/04/2023 Mammogram   There is a partly obscured oval mass in the retroareolar right breast corresponding to the palpable lump, measuring approximately 1.2 cm in size.   There is a small stable mass in the lateral retroareolar left breast. There no other defined masses, no areas of architectural distortion and no suspicious calcifications. The patient has retropectoral saline implants.  Targeted ultrasound is performed, showing a cystic and solid mass, predominantly solid, hypoechoic and superficial in the 12 o'clock position of the retroareolar right breast, corresponding to the lump. This measures 1.9 x 1.4 x 1.7 cm. There is internal blood flow within the solid components on color Doppler  analysis. Margins are mostly ill-defined.   11/04/2023 Pathology Results   1. Breast, right, needle core biopsy, 12 o'clock :       INVASIVE MAMMARY CARCINOMA, SEE NOTE       TUBULE FORMATION: SCORE 3       NUCLEAR PLEOMORPHISM: SCORE 2       MITOTIC COUNT: SCORE 1       TOTAL SCORE: 6       OVERALL GRADE: 2       LYMPHOVASCULAR INVASION: NOT IDENTIFIED       CANCER LENGTH: 1.1 CM       CALCIFICATIONS: NOT IDENTIFIED       OTHER FINDINGS: NONE    Estrogen Receptor:  100%, POSITIVE, STRONG STAINING INTENSITY  Progesterone Receptor:  10%, POSITIVE, WEAK STAINING INTENSITY  Proliferation Marker Ki67:  1%  Her 2 by FISH negative.    11/11/2023 Initial Diagnosis   Malignant neoplasm of upper-outer quadrant of right breast in female, estrogen receptor positive (HCC)   11/16/2023 Cancer Staging   Staging form: Breast, AJCC 8th Edition - Clinical stage from 11/16/2023: Stage IA (cT1c, cN0, cM0, G2, ER+, PR+, HER2-) - Signed by Lanell Donald Stagger, PA-C on 11/16/2023 Stage prefix: Initial diagnosis Method of lymph node assessment: Clinical Histologic grading system: 3 grade system      RISK FACTORS:  Menarche was at age 71.  First live birth at age 73.  OCP use for approximately 0 years.  Hysterectomy: yes.  Menopausal status: postmenopausal.  HRT use: 0 years. Colonoscopy: yes; normal. Mammogram within the  last year: yes. Number of breast biopsies: multiple.  Past Medical History:  Diagnosis Date   Family history of bladder cancer    Hepatitis C    Hypertension    Hypothyroidism    Kidney stone     Past Surgical History:  Procedure Laterality Date   BREAST BIOPSY Right 11/04/2023   US  RT BREAST BX W LOC DEV 1ST LESION IMG BX SPEC US  GUIDE 11/04/2023 [facility] MAMMOGRAPHY   BREAST EXCISIONAL BIOPSY Bilateral    1981/1980   BREAST LUMPECTOMY Left    1998   BREAST SURGERY     augmentation    Social History   Socioeconomic History   Marital status: Divorced    Spouse  name: Not on file   Number of children: Not on file   Years of education: Not on file   Highest education level: Not on file  Occupational History   Not on file  Tobacco Use   Smoking status: Never   Smokeless tobacco: Never  Substance and Sexual Activity   Alcohol use: No   Drug use: No   Sexual activity: Not on file  Other Topics Concern   Not on file  Social History Narrative   Not on file   Social Drivers of Health   Financial Resource Strain: Not on file  Food Insecurity: Not on file  Transportation Needs: Not on file  Physical Activity: Not on file  Stress: Not on file  Social Connections: Not on file     FAMILY HISTORY:  We obtained a detailed, 4-generation family history.  Significant diagnoses are listed below: Family History  Problem Relation Age of Onset   Skin cancer Father    Skin cancer Brother    Bladder Cancer Maternal Uncle    Breast cancer Neg Hx      The patient has three children who are cancer free.  Her oldest daughter reports having genetic testing through her OB office. We do not have those results.  The patient has two brothers who are cancer free.  The patient's mother is deceased and her father is living.  The patient's mother was an only child.  She died of an aneurysm.  There is no reported history of cancer in the family.  The patient's father has skin cancer.  He has a brother with bladder cancer.  There is no other reported family history of cancer.  Amanda Herring is aware of previous family history of genetic testing for hereditary cancer risks. There is no reported Ashkenazi Jewish ancestry. There is no known consanguinity.  GENETIC COUNSELING ASSESSMENT: Amanda Herring is a 68 y.o. female with a personal and family history of cancer which is somewhat suggestive of a hereditary cancer syndrome and predisposition to cancer given her young age of onset and second diagnosis. We, therefore, discussed and recommended the following at today's  visit.   DISCUSSION: We discussed that, in general, most cancer is not inherited in families, but instead is sporadic or familial. Sporadic cancers occur by chance and typically happen at older ages (>50 years) as this type of cancer is caused by genetic changes acquired during an individual's lifetime. Some families have more cancers than would be expected by chance; however, the ages or types of cancer are not consistent with a known genetic mutation or known genetic mutations have been ruled out. This type of familial cancer is thought to be due to a combination of multiple genetic, environmental, hormonal, and lifestyle factors. While this combination of factors  likely increases the risk of cancer, the exact source of this risk is not currently identifiable or testable.  We discussed that 5 - 10% of breast cancer is hereditary, with most cases associated with BRCA mutations.  There are other genes that can be associated with hereditary breast cancer syndromes.  These include ATM, CHEK2 and PALB2.  We discussed that testing is beneficial for several reasons including knowing how to follow individuals after completing their treatment, identifying whether potential treatment options such as PARP inhibitors would be beneficial, and understand if other family members could be at risk for cancer and allow them to undergo genetic testing.   We reviewed the characteristics, features and inheritance patterns of hereditary cancer syndromes. We also discussed genetic testing, including the appropriate family members to test, the process of testing, insurance coverage and turn-around-time for results. We discussed the implications of a negative, positive, carrier and/or variant of uncertain significant result. Amanda Herring  was offered a common hereditary cancer panel (36+ genes) and an expanded pan-cancer panel (70+ genes). Amanda Herring was informed of the benefits and limitations of each panel, including that expanded  pan-cancer panels contain genes that do not have clear management guidelines at this point in time.  We also discussed that as the number of genes included on a panel increases, the chances of variants of uncertain significance increases. Amanda Herring decided to pursue genetic testing for the CancerNext-Expanded+RNAinsight gene panel.   The CancerNext-Expanded gene panel offered by [facility] and includes sequencing, rearrangement, and RNA analysis for the following 77 genes: AIP, ALK, APC, ATM, BAP1, BARD1, BMPR1A, BRCA1, BRCA2, BRIP1, CDC73, CDH1, CDK4, CDKN1B, CDKN2A, CEBPA, CHEK2, CTNNA1, DDX41, DICER1, ETV6, FH, FLCN, GATA2, LZTR1, MAX, MBD4, MEN1, MET, MLH1, MSH2, MSH3, MSH6, MUTYH, NF1, NF2, NTHL1, PALB2, PHOX2B, PMS2, POT1, PRKAR1A, PTCH1, PTEN, RAD51C, RAD51D, RB1, RET, RPS20, RUNX1, SDHA, SDHAF2, SDHB, SDHC, SDHD, SMAD4, SMARCA4, SMARCB1, SMARCE1, STK11, SUFU, TMEM127, TP53, TSC1, TSC2, VHL, and WT1 (sequencing and deletion/duplication); AXIN2, CTNNA1, DDX41, EGFR, HOXB13, KIT, MBD4, MITF, MSH3, PDGFRA, POLD1 and POLE (sequencing only); EPCAM and GREM1 (deletion/duplication only). RNA data is routinely analyzed for use in variant interpretation for all genes.   Based on Amanda Herring's personal and family history of cancer, she meets medical criteria for genetic testing. Despite that she meets criteria, she may still have an out of pocket cost. We discussed that if her out of pocket cost for testing is over $100, the laboratory will call and confirm whether she wants to proceed with testing.  If the out of pocket cost of testing is less than $100 she will be billed by the genetic testing laboratory.   PLAN: After considering the risks, benefits, and limitations, Amanda Herring provided informed consent to pursue genetic testing and the blood sample was sent to [facility] for analysis of the CancerNext-Expanded+RNAinsight. Results should be available within approximately 2-3 weeks'  time, at which point they will be disclosed by telephone to Amanda Herring, as will any additional recommendations warranted by these results. Amanda Herring will receive a summary of her genetic counseling visit and a copy of her results once available. This information will also be available in Epic.   Lastly, we encouraged Amanda Herring to remain in contact with cancer genetics annually so that we can continuously update the family history and inform her of any changes in cancer genetics and testing that may be of benefit for this family.   Amanda Herring's questions were answered to her satisfaction today.  Our contact information was provided should additional questions or concerns arise. Thank you for the referral and allowing us  to share in the care of your patient.   Tyrea Froberg P. Perri, MS, CGC Licensed, Patent attorney [email or web address] phone: [phone]  In total, 30 minutes were spent on the date of the encounter in service to the patient including preparation, face-to-face consultation, documentation and care coordination.  The patient brought her daughter, [name]. Drs. Lanny Stalls, and/or Gudena were available for questions, if needed..    _______________________________________________________________________ For Office Staff:  Number of people involved in session: 2 Was an Intern/ student involved with case: no

## 2023-11-16 NOTE — Therapy (Addendum)
 OUTPATIENT PHYSICAL THERAPY BREAST CANCER BASELINE EVALUATION   Patient Name: Amanda Herring MRN: [id number] DOB:10-24-1955, 68 y.o., female Today's Date: 11/16/2023  END OF SESSION:  PT End of Session - 11/16/23 0953     Visit Number 1    Number of Visits 2    Date for PT Re-Evaluation 01/11/24    PT Start Time 1110    PT Stop Time 1148    PT Time Calculation (min) 38 min    Activity Tolerance Patient tolerated treatment well    Behavior During Therapy WFL for tasks assessed/performed          Past Medical History:  Diagnosis Date   Family history of bladder cancer    Hepatitis C    Hypertension    Hypothyroidism    Kidney stone    Past Surgical History:  Procedure Laterality Date   BREAST BIOPSY Right 11/04/2023   US  RT BREAST BX W LOC DEV 1ST LESION IMG BX SPEC US  GUIDE 11/04/2023 [facility]   BREAST EXCISIONAL BIOPSY Bilateral    1981/1980   BREAST LUMPECTOMY Left    1998   BREAST SURGERY     augmentation   Patient Active Problem List   Diagnosis Date Noted   Family history of bladder cancer    Malignant neoplasm of upper-outer quadrant of right breast in female, estrogen receptor positive (HCC) 11/11/2023    REFERRING PROVIDER: Dr. Jina Nephew  REFERRING DIAG: Right breast cancer  THERAPY DIAG:  Malignant neoplasm of upper-outer quadrant of right breast in female, estrogen receptor positive (HCC)  Abnormal posture  Rationale for Evaluation and Treatment: Rehabilitation  ONSET DATE: 11/04/2023  SUBJECTIVE:                                                                                                                                                                                           SUBJECTIVE STATEMENT: Patient reports she is here today to be seen by her medical team for her newly diagnosed right breast cancer.   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 It  measures 1.9 cm and is located in the upper outer quadrant. It is ER/PR positive and HER2 negative with a Ki67 of 1%. She had a left lumpectomy in 1998 due to breast cancer at [facility].  PATIENT GOALS:   reduce lymphedema risk and learn post op HEP.   PAIN:  Are you having pain? Yes: NPRS scale: 7/10 Pain location: right hamstring Pain description: tight Aggravating factors: walking Relieving factors: sitting  PRECAUTIONS: Active CA   RED FLAGS: None  HAND DOMINANCE: right  WEIGHT BEARING RESTRICTIONS: No  FALLS:  Has patient fallen in last 6 months? No  LIVING ENVIRONMENT: Patient lives with: alone Lives in: House/apartment Has following equipment at home: None  OCCUPATION: retired  LEISURE: She walks walking consistently but then injured her hamstring earlier this year and hasn't been able to walk regularly since then  PRIOR LEVEL OF FUNCTION: Independent   OBJECTIVE: Note: Objective measures were completed at Evaluation unless otherwise noted.  COGNITION: Overall cognitive status: Within functional limits for tasks assessed    POSTURE:  Forward head and rounded shoulders posture  UPPER EXTREMITY AROM/PROM:  A/PROM RIGHT   Eval Addendum - added after eval 11/24/23   Shoulder extension 70  Shoulder flexion 153  Shoulder abduction 175  Shoulder internal rotation 80  Shoulder external rotation 75    (Blank rows = not tested)  A/PROM LEFT   Eval Addendum - added after eval 11/24/23  Shoulder extension 58  Shoulder flexion 163  Shoulder abduction 170  Shoulder internal rotation 75  Shoulder external rotation 95    (Blank rows = not tested)  UPPER EXTREMITY STRENGTH: WFL  LYMPHEDEMA ASSESSMENTS (in cm):   LANDMARK RIGHT   Eval Addendum - added after eval 11/24/23  10 cm proximal to olecranon process 33.7  Olecranon process 27.6  10 cm proximal to ulnar styloid process 21.8  Just proximal to ulnar styloid process 16.1  Across hand at thumb web space  19.5  At base of 2nd digit 6.2  (Blank rows = not tested)  LANDMARK LEFT   Eval Addendum - added after eval 11/24/23  10 cm proximal to olecranon process 35.2  Olecranon process 30  10 cm proximal to ulnar styloid process 22.5  Just proximal to ulnar styloid process 16.2  Across hand at thumb web space 20.2  At base of 2nd digit 6.5  (Blank rows = not tested)  L-DEX LYMPHEDEMA SCREENING: The patient was assessed using the L-Dex machine today to produce a lymphedema index baseline score. The patient will be reassessed on a regular basis (typically every 3 months) to obtain new L-Dex scores. If the score is > 6.5 points away from his/her baseline score indicating onset of subclinical lymphedema, it will be recommended to wear a compression garment for 4 weeks, 12 hours per day and then be reassessed. If the score continues to be > 6.5 points from baseline at reassessment, we will initiate lymphedema treatment. Assessing in this manner has a 95% rate of preventing clinically significant lymphedema.   L-DEX FLOWSHEETS - 11/16/23 0900       L-DEX LYMPHEDEMA SCREENING   Measurement Type Unilateral    L-DEX MEASUREMENT EXTREMITY Upper Extremity    POSITION  Standing    DOMINANT SIDE Right    At Risk Side Right    BASELINE SCORE (UNILATERAL) -5.5          QUICK DASH SURVEY:  Junie Palin - 11/16/23 0001     Open a tight or new jar No difficulty    Do heavy household chores (wash walls, wash floors) No difficulty    Carry a shopping bag or briefcase No difficulty    Wash your back No difficulty    Use a knife to cut food No difficulty    Recreational activities in which you take some force or impact through your arm, shoulder, or hand (golf, hammering, tennis) No difficulty    During the past week, to what extent has your arm, shoulder or  hand problem interfered with your normal social activities with family, friends, neighbors, or groups? Not at all    During the past week, to what  extent has your arm, shoulder or hand problem limited your work or other regular daily activities Not at all    Arm, shoulder, or hand pain. None    Tingling (pins and needles) in your arm, shoulder, or hand None    Difficulty Sleeping No difficulty    DASH Score 0 %           PATIENT EDUCATION:  Education details: Time spent educating patient on aspects of self-care to maximize post op recovery. Patient was educated on where and how to get a post op compression bra to use to reduce post op edema. Patient was also educated on the use of SOZO screenings and surveillance principles for early identification of lymphedema onset. She was instructed to use the post op pillow in the axilla for pressure and pain relief. Patient educated on lymphedema risk reduction and post op shoulder/posture HEP. Person educated: Patient Education method: Explanation, Demonstration, Handout Education comprehension: Patient verbalized understanding and returned demonstration  HOME EXERCISE PROGRAM: Patient was instructed today in a home exercise program today for post op shoulder range of motion. These included active assist shoulder flexion in sitting, scapular retraction, wall walking with shoulder abduction, and hands behind head external rotation.  She was encouraged to do these twice a day, holding 3 seconds and repeating 5 times when permitted by her physician.   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 It measures 1.9 cm and is located in the upper outer quadrant. It is ER/PR positive and HER2 negative with a Ki67 of 1%. She had a left lumpectomy in 1998 due to breast cancer at [facility]. Her multidisciplinary medical team met prior to her assessments to determine a recommended treatment plan. She is planning to have a right lumpectomy and sentinel node biopsy followed by Oncotype testing, radiation, and anti-estrogen therapy.  She will benefit from a post op PT reassessment to determine needs and from L-Dex screens every 3 months for 2 years to detect subclinical lymphedema.  Pt will benefit from skilled therapeutic intervention to improve on the following deficits: Decreased knowledge of precautions, impaired UE functional use, pain, decreased ROM, postural dysfunction.   PT treatment/interventions: ADL/self-care home management, pt/family education, therapeutic exercise  REHAB POTENTIAL: Excellent  CLINICAL DECISION MAKING: Stable/uncomplicated  EVALUATION COMPLEXITY: Low   GOALS: Goals reviewed with patient? YES  LONG TERM GOALS: (STG=LTG)    Name Target Date Goal status  1 Pt will be able to verbalize understanding of pertinent lymphedema risk reduction practices relevant to her dx specifically related to skin care.  Baseline:  No knowledge 11/16/2023 Achieved at eval  2 Pt will be able to return demo and/or verbalize understanding of the post op HEP related to regaining shoulder ROM. Baseline:  No knowledge 11/16/2023 Achieved at eval  3 Pt will be able to verbalize understanding of the importance of viewing the post op After Breast CA Class video for further lymphedema risk reduction education and therapeutic exercise.  Baseline:  No knowledge 11/16/2023 Achieved at eval  4 Pt will demo she has regained full shoulder ROM and function post operatively compared to baselines.  Baseline: See objective measurements taken today. 01/11/2024     PLAN:  PT FREQUENCY/DURATION: EVAL and 1 follow up appointment.   PLAN FOR NEXT SESSION: will  reassess 3-4 weeks post op to determine needs.   Patient will follow up at outpatient cancer rehab 3-4 weeks following surgery.  If the patient requires physical therapy at that time, a specific plan will be dictated and sent to the referring physician for approval. The patient was educated today on appropriate basic range of motion exercises to begin post operatively and the  importance of viewing the After Breast Cancer class video following surgery.  Patient was educated today on lymphedema risk reduction practices as it pertains to recommendations that will benefit the patient immediately following surgery.  She verbalized good understanding.    Physical Therapy Information for After Breast Cancer Surgery/Treatment:  Lymphedema is a swelling condition that you may be at risk for in your arm if you have lymph nodes removed from the armpit area.  After a sentinel node biopsy, the risk is approximately 5-9% and is higher after an axillary node dissection.  There is treatment available for this condition and it is not life-threatening.  Contact your physician or physical therapist with concerns. You may begin the 4 shoulder/posture exercises (see additional sheet) when permitted by your physician (typically a week after surgery).  If you have drains, you may need to wait until those are removed before beginning range of motion exercises.  A general recommendation is to not lift your arms above shoulder height until drains are removed.  These exercises should be done to your tolerance and gently.  This is not a no pain/no gain type of recovery so listen to your body and stretch into the range of motion that you can tolerate, stopping if you have pain.  If you are having immediate reconstruction, ask your plastic surgeon about doing exercises as he or she may want you to wait. We encourage you to view the After Breast Cancer class video following surgery.  You will learn information related to lymphedema risk, prevention and treatment and additional exercises to regain mobility following surgery.   While undergoing any medical procedure or treatment, try to avoid blood pressure being taken or needle sticks from occurring on the arm on the side of cancer.   This recommendation begins after surgery and continues for the rest of your life.  This may help reduce your risk of getting  lymphedema (swelling in your arm). An excellent resource for those seeking information on lymphedema is the National Lymphedema Network's web site. It can be accessed at www.lymphnet.org If you notice swelling in your hand, arm or breast at any time following surgery (even if it is many years from now), please contact your doctor or physical therapist to discuss this.  Lymphedema can be treated at any time but it is easier for you if it is treated early on.  If you feel like your shoulder motion is not returning to normal in a reasonable amount of time, please contact your surgeon or physical therapist.  [facility] [phone]. [address], [place] [address]  [facility] CLASS After Breast Cancer Class  After Breast Cancer Class is a specially designed exercise class video to assist you in a safe recover after having breast cancer surgery.  In this video you will learn how to get back to full function whether your drains were just removed or if you had surgery a month ago. The video can be viewed on this page: [email or web address].boyd-meyer.org/ or on YouTube here: https://youtu.[email or web address].  Class Goals  Understand specific stretches to improve the flexibility of you  chest and shoulder. Learn ways to safely strengthen your upper body and improve your posture. Understand the warning signs of infection and why you may be at risk for an arm infection. Learn about Lymphedema and prevention.  ** You do not need to view this video until after surgery.  Drains should be removed to participate in the recommended exercises on the video.  Patient was instructed today in a home exercise program today for post op shoulder range of motion. These included active assist shoulder flexion in sitting, scapular retraction, wall walking with shoulder abduction, and hands behind head external rotation.  She was  encouraged to do these twice a day, holding 3 seconds and repeating 5 times when permitted by her physician.  Eward Wonda Sharps, [place] 11/16/23 12:04 PM  11/24/23 Addendum by Saddie Raw, PT

## 2023-11-16 NOTE — Progress Notes (Signed)
 [facility] CONSULT NOTE  Patient Care Team: Dyane Anthony RAMAN, FNP as PCP - General (Family Medicine) Tyree Nanetta SAILOR, RN as Oncology Nurse Navigator Gerome, Devere HERO, RN as Oncology Nurse Navigator Aron Shoulders, MD as Consulting Physician (General Surgery) Loretha Ash, MD as Consulting Physician (Hematology and Oncology) Dewey Rush, MD as Consulting Physician (Radiation Oncology)  CHIEF COMPLAINTS/PURPOSE OF CONSULTATION:  Newly diagnosed breast cancer  HISTORY OF PRESENTING ILLNESS:  Amanda Herring 68 y.o. female is here because of recent diagnosis of right breast ILC.  I reviewed her records extensively and collaborated the history with the patient.  SUMMARY OF ONCOLOGIC HISTORY: Oncology History  Malignant neoplasm of upper-outer quadrant of right breast in female, estrogen receptor positive (HCC)  11/04/2023 Mammogram   There is a partly obscured oval mass in the retroareolar right breast corresponding to the palpable lump, measuring approximately 1.2 cm in size.   There is a small stable mass in the lateral retroareolar left breast. There no other defined masses, no areas of architectural distortion and no suspicious calcifications. The patient has retropectoral saline implants.  Targeted ultrasound is performed, showing a cystic and solid mass, predominantly solid, hypoechoic and superficial in the 12 o'clock position of the retroareolar right breast, corresponding to the lump. This measures 1.9 x 1.4 x 1.7 cm. There is internal blood flow within the solid components on color Doppler analysis. Margins are mostly ill-defined.   11/04/2023 Pathology Results   1. Breast, right, needle core biopsy, 12 o'clock :       INVASIVE MAMMARY CARCINOMA, SEE NOTE       TUBULE FORMATION: SCORE 3       NUCLEAR PLEOMORPHISM: SCORE 2       MITOTIC COUNT: SCORE 1       TOTAL SCORE: 6       OVERALL GRADE: 2       LYMPHOVASCULAR INVASION: NOT IDENTIFIED        CANCER LENGTH: 1.1 CM       CALCIFICATIONS: NOT IDENTIFIED       OTHER FINDINGS: NONE    Estrogen Receptor:  100%, POSITIVE, STRONG STAINING INTENSITY  Progesterone Receptor:  10%, POSITIVE, WEAK STAINING INTENSITY  Proliferation Marker Ki67:  1%  Her 2 by FISH negative.    11/11/2023 Initial Diagnosis   Malignant neoplasm of upper-outer quadrant of right breast in female, estrogen receptor positive (HCC)   11/16/2023 Cancer Staging   Staging form: Breast, AJCC 8th Edition - Clinical stage from 11/16/2023: Stage IA (cT1c, cN0, cM0, G2, ER+, PR+, HER2-) - Signed by Lanell Donald Stagger, PA-C on 11/16/2023 Stage prefix: Initial diagnosis Method of lymph node assessment: Clinical Histologic grading system: 3 grade system     Discussed the use of AI scribe software for clinical note transcription with the patient, who gave verbal consent to proceed.  History of Present Illness Amanda Herring is a 68 year old female with a history of breast cancer who presents for evaluation of invasive lobular breast cancer. She is accompanied by her daughter, Amanda Herring.  She has been diagnosed with stage one, grade two invasive lobular breast cancer located in the nipple area of the breast, with the largest dimension of the tumor being approximately 1.9 centimeters. The cancer is estrogen receptor positive (ER 100%) and progesterone receptor positive (PR 10%), with a KI-67 of 1%.  She has a history of breast cancer on the opposite side, diagnosed as ductal carcinoma in situ (DCIS) in 1998, for which  she underwent surgery without radiation or hormonal therapy. She has had multiple lumpectomies since the 1980s and breast reduction surgery. Recently, she noticed changes in a previously identified fluid-filled cyst, which has been growing and hardening over the past three months.  Her current medications include hydrochlorothiazide 25 mg, levothyroxine 50 mcg, Ambien for sleep, and she takes vitamin D and calcium  supplements. She previously used vaginal estrogen following a hysterectomy in 2009 but discontinued it after a few years.  She has a history of high blood pressure, managed with hydrochlorothiazide, and hypothyroidism, managed with levothyroxine. She reports being able to feel her pulse at times, described as 'whooshing'. She no longer takes medications for kidney stones, which she had in 2017.     MEDICAL HISTORY:  Past Medical History:  Diagnosis Date   Hepatitis C    Hypertension    Hypothyroidism    Kidney stone     SURGICAL HISTORY: Past Surgical History:  Procedure Laterality Date   BREAST BIOPSY Right 11/04/2023   US  RT BREAST BX W LOC DEV 1ST LESION IMG BX SPEC US  GUIDE 11/04/2023 [facility]AMMOGRAPHY   BREAST EXCISIONAL BIOPSY Bilateral    1981/1980   BREAST LUMPECTOMY Left    1998   BREAST SURGERY     augmentation    SOCIAL HISTORY: Social History   Socioeconomic History   Marital status: Divorced    Spouse name: Not on file   Number of children: Not on file   Years of education: Not on file   Highest education level: Not on file  Occupational History   Not on file  Tobacco Use   Smoking status: Never   Smokeless tobacco: Never  Substance and Sexual Activity   Alcohol use: No   Drug use: No   Sexual activity: Not on file  Other Topics Concern   Not on file  Social History Narrative   Not on file   Social Drivers of Health   Financial Resource Strain: Not on file  Food Insecurity: Not on file  Transportation Needs: Not on file  Physical Activity: Not on file  Stress: Not on file  Social Connections: Not on file  Intimate Partner Violence: Not on file    FAMILY HISTORY: Family History  Problem Relation Age of Onset   Skin cancer Father    Skin cancer Brother    Breast cancer Neg Hx     ALLERGIES:  is allergic to codeine and dilaudid  [hydromorphone  hcl].  MEDICATIONS:  Current Outpatient Medications  Medication Sig Dispense Refill   b  complex vitamins capsule Take 1 capsule by mouth daily.     Calcium-Vitamin D (CALTRATE 600 PLUS-VIT D PO) Take 1 tablet by mouth daily.     cetirizine (ZYRTEC) 10 MG tablet Take 10 mg by mouth daily.     ibuprofen (ADVIL,MOTRIN) 200 MG tablet Take 600 mg by mouth every 6 (six) hours as needed for moderate pain.     losartan-hydrochlorothiazide (HYZAAR) 100-25 MG per tablet Take 1 tablet by mouth daily.      Multiple Vitamins-Minerals (ONE-A-DAY 50 PLUS) TABS Take 1 tablet by mouth every other day.      OVER THE COUNTER MEDICATION Take 1 capsule by mouth daily.     oxybutynin  (DITROPAN ) 5 MG tablet Take 1 tablet (5 mg total) by mouth every 8 (eight) hours as needed for bladder spasms. 8 tablet 0   POTASSIUM PO Take 1 tablet by mouth daily.     zolpidem (AMBIEN) 10 MG  tablet Take 10 mg by mouth at bedtime as needed for sleep.      No current facility-administered medications for this visit.     PHYSICAL EXAMINATION: ECOG PERFORMANCE STATUS: 0 - Asymptomatic  Vitals:   11/16/23 0841  BP: 125/72  Pulse: 68  Resp: 17  Temp: (!) 97.3 F (36.3 C)  SpO2: 99%   Filed Weights   11/16/23 0841  Weight: 225 lb 11.2 oz (102.4 kg)    GENERAL:alert, no distress and comfortable  BREAST:palpable retoareolar mass in the right breast measuring up to 2 cms. No regional adenopathy  LABORATORY DATA:  I have reviewed the data as listed Lab Results  Component Value Date   WBC 9.6 07/15/2016   HGB 14.2 07/15/2016   HCT 40.9 07/15/2016   MCV 91.3 07/15/2016   PLT 209 07/15/2016   Lab Results  Component Value Date   NA 141 07/15/2016   K 3.7 07/15/2016   CL 105 07/15/2016   CO2 27 07/15/2016    RADIOGRAPHIC STUDIES: I have personally reviewed the radiological reports and agreed with the findings in the report.  ASSESSMENT AND PLAN:  Malignant neoplasm of upper-outer quadrant of right breast in female, estrogen receptor positive (HCC) Assessment and Plan Assessment & Plan Invasive  lobular carcinoma of left breast, stage 1, grade 2, ER+ Invasive lobular carcinoma, 1.9 cm, ER+, weak PR+, low Ki-67 index.  - Send tumor specimen for Oncotype DX testing. We have discussed about Oncotype Dx score which is a well validated prognostic scoring system which can predict outcome with endocrine therapy alone and whether chemotherapy reduces recurrence.  Typically in patients with ER positive cancers that are node negative if the RS score is high typically greater than or equal to 26, chemotherapy is recommended.  If chemotherapy is needed, this will precede radiation and then after radiation she will continue on antiestrogen therapy.  PLAN  - Evaluate Oncotype DX results for chemotherapy necessity. - Refer to radiation oncology for post-surgical radiation therapy. - Initiate endocrine therapy with antiestrogen post-radiation. Discussed about anti estogen therapy. We have discussed options for antiestrogen therapy today. With regards to aromatase inhibitors, we discussed mechanism of action, adverse effects including but not limited to post menopausal symptoms, arthralgias, myalgias, increased risk of cardiovascular events and bone loss.  - Monitor bone density biennially during endocrine therapy. - Advise on exercises and vitamin D supplementation for bone density.  Amber Stalls MD     All questions were answered. The patient knows to call the clinic with any problems, questions or concerns.    Amber Stalls, MD 11/16/23

## 2023-11-17 ENCOUNTER — Other Ambulatory Visit: Payer: Self-pay

## 2023-11-17 ENCOUNTER — Ambulatory Visit: Attending: Orthopedic Surgery | Admitting: Physical Therapy

## 2023-11-17 DIAGNOSIS — R269 Unspecified abnormalities of gait and mobility: Secondary | ICD-10-CM | POA: Insufficient documentation

## 2023-11-17 DIAGNOSIS — M6281 Muscle weakness (generalized): Secondary | ICD-10-CM | POA: Insufficient documentation

## 2023-11-17 DIAGNOSIS — R293 Abnormal posture: Secondary | ICD-10-CM | POA: Diagnosis not present

## 2023-11-17 DIAGNOSIS — M79604 Pain in right leg: Secondary | ICD-10-CM | POA: Diagnosis not present

## 2023-11-17 DIAGNOSIS — R279 Unspecified lack of coordination: Secondary | ICD-10-CM | POA: Insufficient documentation

## 2023-11-17 NOTE — Therapy (Unsigned)
 OUTPATIENT PHYSICAL THERAPY LOWER EXTREMITY EVALUATION   Patient Name: Amanda Herring MRN: [id number] DOB:October 10, 1955, 68 y.o., female Today's Date: 11/17/2023  END OF SESSION:  PT End of Session - 11/17/23 1525     Visit Number 2   ortho 1   Date for PT Re-Evaluation 01/12/24    Authorization Type [employer]    PT Start Time 1518    PT Stop Time 1600    PT Time Calculation (min) 42 min    Activity Tolerance Patient tolerated treatment well    Behavior During Therapy WFL for tasks assessed/performed          Past Medical History:  Diagnosis Date   Family history of bladder cancer    Hepatitis C    Hypertension    Hypothyroidism    Kidney stone    Past Surgical History:  Procedure Laterality Date   BREAST BIOPSY Right 11/04/2023   US  RT BREAST BX W LOC DEV 1ST LESION IMG BX SPEC US  GUIDE 11/04/2023 [facility]   BREAST EXCISIONAL BIOPSY Bilateral    1981/1980   BREAST LUMPECTOMY Left    1998   BREAST SURGERY     augmentation   Patient Active Problem List   Diagnosis Date Noted   Family history of bladder cancer    Malignant neoplasm of upper-outer quadrant of right breast in female, estrogen receptor positive (HCC) 11/11/2023    PCP: Dyane Anthony RAMAN, FNP   REFERRING PROVIDER: ***  REFERRING DIAG: ***  THERAPY DIAG:  Muscle weakness (generalized)  Unspecified lack of coordination  Abnormal posture  Pain in right leg  Abnormality of gait and mobility  Rationale for Evaluation and Treatment: Rehabilitation  ONSET DATE: jan 2025  SUBJECTIVE:   SUBJECTIVE STATEMENT: ***  PERTINENT HISTORY: *** PAIN:  Are you having pain? Yes: NPRS scale: 4-9/10 Pain location: Rt medial knee/distal hamstring Pain description: sharp (when its high), dull achy (constant) Aggravating factors: walking, carrying 58 month old grand daughter, end of the day Relieving factors: rest, propping, ice, advil    PRECAUTIONS: Other: cancer pt  RED  FLAGS: None   WEIGHT BEARING RESTRICTIONS: No  FALLS:  Has patient fallen in last 6 months? No  LIVING ENVIRONMENT: Lives with: lives alone Lives in: House/apartment Stairs: No Has following equipment at home: None  OCCUPATION: retired   PLOF: Independent  PATIENT GOALS: to have less pain to go back to walking (was doing 1-2 miles)  NEXT MD VISIT: nothing planned   OBJECTIVE:  Note: Objective measures were completed at Evaluation unless otherwise noted.  DIAGNOSTIC FINDINGS: ***  PATIENT SURVEYS:  LEFS  Extreme difficulty/unable (0), Quite a bit of difficulty (1), Moderate difficulty (2), Little difficulty (3), No difficulty (4) Survey date:  11/17/23  Any of your usual work, housework or school activities 1  2. Usual hobbies, recreational or sporting activities 0  3. Getting into/out of the bath 1  4. Walking between rooms 1  5. Putting on socks/shoes 2  6. Squatting  1  7. Lifting an object, like a bag of groceries from the floor 1  8. Performing light activities around your home 1  9. Performing heavy activities around your home 0  10. Getting into/out of a car 1  11. Walking 2 blocks 0  12. Walking 1 mile 0  13. Going up/down 10 stairs (1 flight) 1  14. Standing for 1 hour 1  15.  sitting for 1 hour 4  16. Running on even ground  0  17. Running on uneven ground 0  18. Making sharp turns while running fast 0  19. Hopping  0  20. Rolling over in bed 3  Score total:  ***     COGNITION: Overall cognitive status: Within functional limits for tasks assessed     SENSATION: WFL  EDEMA:  Circumferential: 49cm diameter (7cm below patella) Rt  44cm Lt same position  MUSCLE LENGTH: Hamstrings: Right *** deg; Left *** deg Debby test: Right *** deg; Left *** deg  POSTURE: {posture:25561}  PALPATION: ***  LOWER EXTREMITY ROM:  {AROM/PROM:27142} ROM Right eval Left eval  Hip flexion    Hip extension    Hip abduction    Hip adduction    Hip internal  rotation    Hip external rotation    Knee flexion    Knee extension    Ankle dorsiflexion    Ankle plantarflexion    Ankle inversion    Ankle eversion     (Blank rows = not tested)  LOWER EXTREMITY MMT:  MMT Right eval Left eval  Hip flexion    Hip extension    Hip abduction    Hip adduction    Hip internal rotation    Hip external rotation    Knee flexion    Knee extension    Ankle dorsiflexion    Ankle plantarflexion    Ankle inversion    Ankle eversion     (Blank rows = not tested)  LOWER EXTREMITY SPECIAL TESTS:  Hip special tests: SI compression test: neg , SI distraction test: negative, Hip scouring test: negative, and Piriformis test: positive    FUNCTIONAL TESTS:  5 times sit to stand: 11s no AD lowest table setting 2 minute walk test: 384' no AD  GAIT: Distance walked: TWO MINUTE WALK TEST *individual walks without assistance for 2 minutes and the distance is measured *start timing when the individual is instructed to "Go" *stop timing at 2 minutes *assistive devices can be used but should be kept consistent and  documented from test to test  *if physical assistance is required to walk, this should not be performed * a measuring wheel is helpful to determine distance walked *should be performed at the fastest speed possible   Gender Age in years (# in study) Mean distance in meters Female  18-54 (260)   200.9 55-59 (23)   191.0 60-64 (29)   179.1 (587 feet) 65-69 (22)   184.2 70-74 (32)   172.2 75-79 (19)   157.6 80-85 (32)   144.1  Female 18-54 (539)   183.0 55-59 (30)   176.4 60-64 (48)   166.4 65-69 (22)   155.2 70-74 (33)   145.9 75-79 (14)   140.9 80-85 (34)   134.3  Assistive device utilized: {Assistive devices:23999} Level of assistance: {Levels of assistance:24026} Comments: ***  TREATMENT DATE: ***     PATIENT EDUCATION:  Education details: *** Person educated: {Person educated:25204} Education method: {Education Method:25205} Education comprehension: {Education Comprehension:25206}  HOME EXERCISE PROGRAM: ***  ASSESSMENT:  CLINICAL IMPRESSION: Patient is a *** y.o. *** who was seen today for physical therapy evaluation and treatment for ***.   OBJECTIVE IMPAIRMENTS: {opptimpairments:25111}.   ACTIVITY LIMITATIONS: {activitylimitations:27494}  PARTICIPATION LIMITATIONS: {participationrestrictions:25113}  PERSONAL FACTORS: {Personal factors:25162} are also affecting patient's functional outcome.   REHAB POTENTIAL: {rehabpotential:25112}  CLINICAL DECISION MAKING: {clinical decision making:25114}  EVALUATION COMPLEXITY: {Evaluation complexity:25115}   GOALS: Goals reviewed with patient? {yes/no:20286}  SHORT TERM GOALS: Target date: *** *** Baseline: Goal status: INITIAL  2.  *** Baseline:  Goal status: INITIAL  3.  *** Baseline:  Goal status: INITIAL  4.  *** Baseline:  Goal status: INITIAL  5.  *** Baseline:  Goal status: INITIAL  6.  *** Baseline:  Goal status: INITIAL  LONG TERM GOALS: Target date: ***  *** Baseline:  Goal status: INITIAL  2.  *** Baseline:  Goal status: INITIAL  3.  *** Baseline:  Goal status: INITIAL  4.  *** Baseline:  Goal status: INITIAL  5.  *** Baseline:  Goal status: INITIAL  6.  *** Baseline:  Goal status: INITIAL   PLAN:  PT FREQUENCY: {rehab frequency:25116}  PT DURATION: {rehab duration:25117}  PLANNED INTERVENTIONS: {rehab planned interventions:25118::97110-Therapeutic exercises,97530- Therapeutic [phone]- Neuromuscular re-education,97535- Self Rjmz,02859- Manual therapy}  PLAN FOR NEXT SESSION: ***  Darryle Navy, PT, DPT 09/04/253:26 PM  [facility] [address], [address] [place], [place] [address] Phone # [phone] Fax  [phone]

## 2023-11-21 ENCOUNTER — Telehealth: Payer: Self-pay

## 2023-11-21 DIAGNOSIS — C50411 Malignant neoplasm of upper-outer quadrant of right female breast: Secondary | ICD-10-CM

## 2023-11-21 NOTE — Telephone Encounter (Signed)
 [employer] 2021-05 - Specimen Collection Study to Evaluate Biomarkers in Subjects with Cancer    Reached out via phone to check in about study interest. Has not had a chance to read study documents. Patient has contact information if she has any questions. Will continue to follow patient on study interest.  Laury Quale, MPH  Clinical Research Coordinator

## 2023-11-22 ENCOUNTER — Ambulatory Visit: Admitting: Physical Therapy

## 2023-11-22 DIAGNOSIS — M6281 Muscle weakness (generalized): Secondary | ICD-10-CM

## 2023-11-22 DIAGNOSIS — M79604 Pain in right leg: Secondary | ICD-10-CM

## 2023-11-22 NOTE — Therapy (Signed)
 OUTPATIENT PHYSICAL THERAPY LOWER EXTREMITY PROGRESS NOTE/ORTHO   Patient Name: Amanda Herring MRN: [id number] DOB:28-Apr-1955, 68 y.o., female Today's Date: 11/22/2023  END OF SESSION:  PT End of Session - 11/22/23 0931     Visit Number 2    Date for PT Re-Evaluation 01/12/24    Authorization Type [employer]    PT Start Time 0931    PT Stop Time 1014    PT Time Calculation (min) 43 min    Activity Tolerance Patient tolerated treatment well          Past Medical History:  Diagnosis Date   Family history of bladder cancer    Hepatitis C    Hypertension    Hypothyroidism    Kidney stone    Past Surgical History:  Procedure Laterality Date   BREAST BIOPSY Right 11/04/2023   US  RT BREAST BX W LOC DEV 1ST LESION IMG BX SPEC US  GUIDE 11/04/2023 [facility]   BREAST EXCISIONAL BIOPSY Bilateral    1981/1980   BREAST LUMPECTOMY Left    1998   BREAST SURGERY     augmentation   Patient Active Problem List   Diagnosis Date Noted   Family history of bladder cancer    Malignant neoplasm of upper-outer quadrant of right breast in female, estrogen receptor positive (HCC) 11/11/2023    PCP: Dyane Anthony RAMAN, FNP   REFERRING PROVIDER:   Edna Toribio LABOR, MD    REFERRING DIAG: RIGHT PES BURSA/RIGHT HAMSTRING STRAIN   THERAPY DIAG:  Muscle weakness (generalized)  Pain in right leg  Rationale for Evaluation and Treatment: Rehabilitation  ONSET DATE: jan 2025  SUBJECTIVE:   SUBJECTIVE STATEMENT:   I lost the exercise paper on Friday.  Walked 1 mile Saturday and it hurt me horribly Sunday and yesterday.  Main spot is medial knee.  Knee injection didn't help at all. Antalgic gait.  Knee X-rays OK. Feels great in the AM but as the day progresses it gets worse.  Stretching feels good (pt demo can touch the floor in standing).  She reports having a lot on her mind (having an MRI tomorrow on her breast)  PERTINENT HISTORY: Malignant neoplasm of  upper-outer quadrant of right breast in female, estrogen receptor positive (HCC) PAIN:  Are you having pain? Yes: NPRS scale: 5-6/10,  Pain location: Rt medial knee/distal hamstring Pain description: sharp (when its high), dull achy (constant) Aggravating factors: walking, carrying 35 month old grand daughter, end of the day Relieving factors: rest, propping, ice, advil    PRECAUTIONS: Other: cancer pt  RED FLAGS: None   WEIGHT BEARING RESTRICTIONS: No  FALLS:  Has patient fallen in last 6 months? No  LIVING ENVIRONMENT: Lives with: lives alone Lives in: House/apartment Stairs: No Has following equipment at home: None  OCCUPATION: retired   PLOF: Independent  PATIENT GOALS: to have less pain to go back to walking (was doing 1-2 miles)  NEXT MD VISIT: nothing planned   OBJECTIVE:  Note: Objective measures were completed at Evaluation unless otherwise noted.  DIAGNOSTIC FINDINGS: no imagining for hamstring or knee  PATIENT SURVEYS:  LEFS  Extreme difficulty/unable (0), Quite a bit of difficulty (1), Moderate difficulty (2), Little difficulty (3), No difficulty (4) Survey date:  11/17/23  Any of your usual work, housework or school activities 1  2. Usual hobbies, recreational or sporting activities 0  3. Getting into/out of the bath 1  4. Walking between rooms 1  5. Putting on socks/shoes 2  6. Squatting  1  7. Lifting an object, like a bag of groceries from the floor 1  8. Performing light activities around your home 1  9. Performing heavy activities around your home 0  10. Getting into/out of a car 1  11. Walking 2 blocks 0  12. Walking 1 mile 0  13. Going up/down 10 stairs (1 flight) 1  14. Standing for 1 hour 1  15.  sitting for 1 hour 4  16. Running on even ground 0  17. Running on uneven ground 0  18. Making sharp turns while running fast 0  19. Hopping  0  20. Rolling over in bed 3  Score total:  18     COGNITION: Overall cognitive status: Within  functional limits for tasks assessed     SENSATION: WFL  EDEMA:  Circumferential: 49cm diameter (7cm below patella) Rt  44cm Lt same position  MUSCLE LENGTH: Rt hamstring limited by 25% with palpable tension at medial hamstring   POSTURE: posterior pelvic tilt  PALPATION: TTP at Rt medial knee joint line, medial hamstrings, medial fibers of quad, and distal adductor  LOWER EXTREMITY ROM:  Full ROM at knee ext and flexion without pain, Rt hip ER at end range at replicated pain but full range.   LOWER EXTREMITY MMT:  Bil hips grossly 4/5 except Rt hip flexion 3+/5  LOWER EXTREMITY SPECIAL TESTS:  Hip special tests: SI compression test: neg , SI distraction test: negative, Hip scouring test: negative, and Piriformis test: positive    FUNCTIONAL TESTS:  5 times sit to stand: 11s no AD lowest table setting 2 minute walk test: 384' no AD  GAIT: Distance walked: TWO MINUTE WALK TEST *individual walks without assistance for 2 minutes and the distance is measured *start timing when the individual is instructed to "Go" *stop timing at 2 minutes *assistive devices can be used but should be kept consistent and  documented from test to test  *if physical assistance is required to walk, this should not be performed * a measuring wheel is helpful to determine distance walked *should be performed at the fastest speed possible   Gender Age in years (# in study) Mean distance in meters Female  18-54 (260)   200.9 55-59 (23)   191.0 60-64 (29)   179.1 (587 feet) 65-69 (22)   184.2 70-74 (32)   172.2 75-79 (19)   157.6 80-85 (32)   144.1  Female 18-54 (539)   183.0 55-59 (30)   176.4 60-64 (48)   166.4 65-69 (22)   155.2 70-74 (33)   145.9 75-79 (14)   140.9 80-85 (34)   134.3  Assistive device utilized: None Level of assistance: Complete Independence Comments: trunk flexion, trunk lateral sway for limb advancement, increased step length at Rt with increased pain with attempts  to shorten step, decreased cadence  TREATMENT DATE:   9/9: Discussion of current status including effects of walking 1 mile (2 days of severe pain); discussed trial of 10 minutes, no hills, shorter step lengths) Review of initial HEP with re-print given: pt does not have an indoor step for calf stretch so we added wall runner's calf stretch Supine piriformis stretch (knee toward opposite shoulder): modified with hands behind knee instead of on top for comfort Sidelying clam 10x right/left (verbal and tactile cues for technique) (Added to HEP- see below) Prone glute sets 10x (Added to HEP- see below) Sit to stand 10x (mild knee pain, better with wider stance) (Added to HEP- see below) Wall runner's calf stretch 30 sec hold 3x (Added to HEP- see below) Nu-Step 7 min L3 Les only (therapist monitoring response-no increased pain in knee or distal HS)   11/17/23: Examination completed, findings reviewed, pt educated on POC, HEP. Pt motivated to participate in PT and agreeable to attempt recommendations.    PATIENT EDUCATION:  Education details: [id number] Person educated: Patient Education method: Explanation Education comprehension: verbalized understanding, returned demonstration, verbal cues required, tactile cues required, and needs further education  HOME EXERCISE PROGRAM: Access Code: [email or web address] URL: [email or web address] Date: 11/22/2023 Prepared by: Glade Pesa  Exercises - Seated Table Hamstring Stretch  - 1 x daily - 7 x weekly - 1 sets - 3 reps - 30s holds - Supine Piriformis Stretch  - 1 x daily - 7 x weekly - 1 sets - 3 reps - 30s holds - Standing Gastroc Stretch on Step with Counter Support  - 1 x daily - 7 x weekly - 1 sets - 3 reps - 30s hold - Supine Hip Internal and External Rotation  - 1 x daily - 7 x weekly - 1 sets - 10 reps - 5s  holds - Clamshell  - 1 x daily - 7 x weekly - 1 sets - 10 reps - Prone Gluteal Sets  - 1 x daily - 7 x weekly - 1 sets - 10 reps - Sit to Stand  - 1 x daily - 7 x weekly - 1 sets - 10 reps - Gastroc Stretch on Wall  - 1 x daily - 7 x weekly - 3 reps - 30 hold  ASSESSMENT:  CLINICAL IMPRESSION: Therapist providing verbal and at times tactile cues to optimize technique with  exercises in order to achieve the greatest benefit. Home exercise program updated to include gluteal activation ex's needed for walking longer periods of time.  We discussed modifying her walking program to 10 minutes to avoid aggravating symptoms.  She responds well to the exercises today without increased pain.     OBJECTIVE IMPAIRMENTS: Abnormal gait, decreased activity tolerance, decreased balance, decreased coordination, decreased endurance, decreased mobility, difficulty walking, decreased ROM, decreased strength, increased fascial restrictions, impaired perceived functional ability, increased muscle spasms, impaired flexibility, improper body mechanics, postural dysfunction, and pain.   ACTIVITY LIMITATIONS: carrying, lifting, bending, standing, squatting, stairs, transfers, and locomotion level  PARTICIPATION LIMITATIONS: meal prep, cleaning, laundry, community activity, and yard work  PERSONAL FACTORS: Fitness, Past/current experiences, Time since onset of injury/illness/exacerbation, and 1 comorbidity: medical history are also affecting patient's functional outcome.   REHAB POTENTIAL: Good  CLINICAL DECISION MAKING: Stable/uncomplicated  EVALUATION COMPLEXITY: Low   GOALS: Goals reviewed with patient? Yes  SHORT TERM GOALS: Target date: 12/15/23 Pt to be I with HEP for carry over and continuing recommendations for improved outcomes.   Baseline: Goal status: INITIAL  2.  Pt to demonstrate  full ROM of Rt hip without pain for improved gait mechanics to tolerate return to PLOF for walking.  Baseline:   Goal status: INITIAL  3.  Pt to demonstrate x10 sit to stands without pain or compensation for improved hip strength for walking. Baseline:  Goal status: INITIAL  4.  Pt to demonstrate 5/5 bil hip strength without pain for improved ability to transfer from ground to standing to return to gardening Baseline:  Goal status: INITIAL   LONG TERM GOALS: Target date: 01/12/24  Pt to be I with advanced HEP for carry over and continuing recommendations for improved outcomes.   Baseline:  Goal status: INITIAL  2.  Pt to report ability to tolerate walking at least 1 mile with no more than 1/10 pain for improved QOL.  Baseline:  Goal status: INITIAL  3.  Pt to demonstrate improved within age related norms due to improved cadence and mechanics of gait for improved ability to complete grocery errands.  Baseline:  Goal status: INITIAL  4.  Pt to demonstrate improved 5xSTS to no more than 7s for decreased fall risk.  Baseline:  Goal status: INITIAL    PLAN:  PT FREQUENCY: 2x/week  PT DURATION: 8 weeks  PLANNED INTERVENTIONS: 97110-Therapeutic exercises, 97530- Therapeutic activity, 97112- Neuromuscular re-education, [phone]- Self Care, 02859- Manual therapy, [phone]- Gait training, [phone]- Canalith repositioning, [id number]- Aquatic Therapy, [phone]- Electrical stimulation (manual), [id number]- Vasopneumatic device, [id number]- Traction (mechanical), [id number]- Ionotophoresis 4mg /ml Dexamethasone, 20560 (1-2 muscles), 20561 (3+ muscles)- Dry Needling, Patient/Family education, Taping, Joint mobilization, Spinal mobilization, Scar mobilization, DME instructions, Cryotherapy, Moist heat, and Biofeedback  PLAN FOR NEXT SESSION: gluteal activation ex's; Nu-Step Les only; stretch hamstrings, adductors, hip strengthening, standing balance  Glade Pesa, PT 11/22/23 12:23 PM Phone: [phone] Fax: [phone]  [facility] [address], [address] [place], [place] [address] Phone #  [phone] Fax [phone]

## 2023-11-23 ENCOUNTER — Ambulatory Visit
Admission: RE | Admit: 2023-11-23 | Discharge: 2023-11-23 | Disposition: A | Discharge status: Home / Self Care | Source: Ambulatory Visit | Attending: General Surgery | Admitting: General Surgery

## 2023-11-23 DIAGNOSIS — Z170 Estrogen receptor positive status [ER+]: Secondary | ICD-10-CM

## 2023-11-23 MED ORDER — GADOPICLENOL 0.5 MMOL/ML IV SOLN
10.0000 mL | Freq: Once | INTRAVENOUS | Status: AC | PRN
  Administered 2023-11-23: 10 mL via INTRAVENOUS

## 2023-11-24 ENCOUNTER — Encounter: Payer: Self-pay | Admitting: Rehabilitation

## 2023-11-24 ENCOUNTER — Telehealth: Payer: Self-pay | Admitting: *Deleted

## 2023-11-24 ENCOUNTER — Ambulatory Visit: Admitting: Rehabilitation

## 2023-11-24 ENCOUNTER — Telehealth: Payer: Self-pay

## 2023-11-24 ENCOUNTER — Other Ambulatory Visit: Payer: Self-pay | Admitting: General Surgery

## 2023-11-24 ENCOUNTER — Encounter: Payer: Self-pay | Admitting: *Deleted

## 2023-11-24 DIAGNOSIS — M6281 Muscle weakness (generalized): Secondary | ICD-10-CM

## 2023-11-24 DIAGNOSIS — M79604 Pain in right leg: Secondary | ICD-10-CM

## 2023-11-24 DIAGNOSIS — C50411 Malignant neoplasm of upper-outer quadrant of right female breast: Secondary | ICD-10-CM | POA: Diagnosis not present

## 2023-11-24 DIAGNOSIS — R279 Unspecified lack of coordination: Secondary | ICD-10-CM

## 2023-11-24 DIAGNOSIS — Z170 Estrogen receptor positive status [ER+]: Secondary | ICD-10-CM

## 2023-11-24 NOTE — Therapy (Signed)
 OUTPATIENT PHYSICAL THERAPY LOWER EXTREMITY PROGRESS NOTE/ORTHO   Patient Name: Amanda Herring MRN: [id number] DOB:1955/07/28, 68 y.o., female Today's Date: 11/24/2023  END OF SESSION:  PT End of Session - 11/24/23 0850     Visit Number 3    Date for PT Re-Evaluation 01/12/24    PT Start Time 0802    PT Stop Time 0848    PT Time Calculation (min) 46 min    Activity Tolerance Patient tolerated treatment well    Behavior During Therapy [facility] for tasks assessed/performed           Past Medical History:  Diagnosis Date   Family history of bladder cancer    Hepatitis C    Hypertension    Hypothyroidism    Kidney stone    Past Surgical History:  Procedure Laterality Date   BREAST BIOPSY Right 11/04/2023   US  RT BREAST BX W LOC DEV 1ST LESION IMG BX SPEC US  GUIDE 11/04/2023 [facility]MOGRAPHY   BREAST EXCISIONAL BIOPSY Bilateral    1981/1980   BREAST LUMPECTOMY Left    1998   BREAST SURGERY     augmentation   Patient Active Problem List   Diagnosis Date Noted   Family history of bladder cancer    Malignant neoplasm of upper-outer quadrant of right breast in female, estrogen receptor positive (HCC) 11/11/2023    PCP: Dyane Anthony RAMAN, FNP   REFERRING PROVIDER:   Edna Toribio LABOR, MD    REFERRING DIAG: RIGHT PES BURSA/RIGHT HAMSTRING STRAIN   THERAPY DIAG:  Muscle weakness (generalized)  Pain in right leg  Unspecified lack of coordination  Rationale for Evaluation and Treatment: Rehabilitation  ONSET DATE: jan 2025  SUBJECTIVE:   SUBJECTIVE STATEMENT:  I feels better when I can stretch it.  Then it starts to tighten   PERTINENT HISTORY: Malignant neoplasm of upper-outer quadrant of right breast in female, estrogen receptor positive (HCC)  PAIN:  Are you having pain? Yes: NPRS scale: 5-6/10,  Pain location: Rt medial knee/distal hamstring Pain description: sharp (when its high), dull achy (constant) Aggravating factors: walking, carrying 6 month  old grand daughter, end of the day Relieving factors: rest, propping, ice, advil    PRECAUTIONS: Other: cancer pt  RED FLAGS: None   WEIGHT BEARING RESTRICTIONS: No  FALLS:  Has patient fallen in last 6 months? No  LIVING ENVIRONMENT: Lives with: lives alone Lives in: House/apartment Stairs: No Has following equipment at home: None  OCCUPATION: retired   PLOF: Independent  PATIENT GOALS: to have less pain to go back to walking (was doing 1-2 miles)  NEXT MD VISIT: nothing planned   OBJECTIVE:  Note: Objective measures were completed at Evaluation unless otherwise noted.  DIAGNOSTIC FINDINGS: no imagining for hamstring or knee  PATIENT SURVEYS:  LEFS  Extreme difficulty/unable (0), Quite a bit of difficulty (1), Moderate difficulty (2), Little difficulty (3), No difficulty (4) Survey date:  11/17/23  Any of your usual work, housework or school activities 1  2. Usual hobbies, recreational or sporting activities 0  3. Getting into/out of the bath 1  4. Walking between rooms 1  5. Putting on socks/shoes 2  6. Squatting  1  7. Lifting an object, like a bag of groceries from the floor 1  8. Performing light activities around your home 1  9. Performing heavy activities around your home 0  10. Getting into/out of a car 1  11. Walking 2 blocks 0  12. Walking 1 mile 0  13. Going up/down 10 stairs (1 flight) 1  14. Standing for 1 hour 1  15.  sitting for 1 hour 4  16. Running on even ground 0  17. Running on uneven ground 0  18. Making sharp turns while running fast 0  19. Hopping  0  20. Rolling over in bed 3  Score total:  18     COGNITION: Overall cognitive status: Within functional limits for tasks assessed     SENSATION: WFL  EDEMA:  Circumferential: 49cm diameter (7cm below patella) Rt  44cm Lt same position  MUSCLE LENGTH: Rt hamstring limited by 25% with palpable tension at medial hamstring   POSTURE: posterior pelvic tilt  PALPATION: TTP at Rt  medial knee joint line, medial hamstrings, medial fibers of quad, and distal adductor  LOWER EXTREMITY ROM:  Full ROM at knee ext and flexion without pain, Rt hip ER at end range at replicated pain but full range.   LOWER EXTREMITY MMT:  Bil hips grossly 4/5 except Rt hip flexion 3+/5  LOWER EXTREMITY SPECIAL TESTS:  Hip special tests: SI compression test: neg , SI distraction test: negative, Hip scouring test: negative, and Piriformis test: positive    FUNCTIONAL TESTS:  5 times sit to stand: 11s no AD lowest table setting 2 minute walk test: 384' no AD  GAIT: Distance walked: TWO MINUTE WALK TEST *individual walks without assistance for 2 minutes and the distance is measured *start timing when the individual is instructed to "Go" *stop timing at 2 minutes *assistive devices can be used but should be kept consistent and  documented from test to test  *if physical assistance is required to walk, this should not be performed * a measuring wheel is helpful to determine distance walked *should be performed at the fastest speed possible   Gender Age in years (# in study) Mean distance in meters Female  18-54 (260)   200.9 55-59 (23)   191.0 60-64 (29)   179.1 (587 feet) 65-69 (22)   184.2 70-74 (32)   172.2 75-79 (19)   157.6 80-85 (32)   144.1  Female 18-54 (539)   183.0 55-59 (30)   176.4 60-64 (48)   166.4 65-69 (22)   155.2 70-74 (33)   145.9 75-79 (14)   140.9 80-85 (34)   134.3  Assistive device utilized: None Level of assistance: Complete Independence Comments: trunk flexion, trunk lateral sway for limb advancement, increased step length at Rt with increased pain with attempts to shorten step, decreased cadence                                                                                                                                TREATMENT DATE:   11/24/23 Completed breast cancer baseline to fill in missing information Nu-Step 7 min L3 LEs only (therapist  monitoring response-no increased pain in knee or distal HS) Seated hamstring stretch 3x20  Supine piriformis stretch (knee  toward opposite shoulder): modified with hands behind knee instead of on top for comfort - first stretch was a bit painful in the knee and then it improved.   (Groin stretch easy)  Sidelying clam 2x10 right/left (verbal and tactile cues for technique) SLR 2x10 bil Prone glute sets 10x 3 hold  Prone extension bil (increased back pain) so stopped after 1  Wall runner's calf stretch 30 sec hold 3x  9/9: Discussion of current status including effects of walking 1 mile (2 days of severe pain); discussed trial of 10 minutes, no hills, shorter step lengths) Review of initial HEP with re-print given: pt does not have an indoor step for calf stretch so we added wall runner's calf stretch Supine piriformis stretch (knee toward opposite shoulder): modified with hands behind knee instead of on top for comfort Sidelying clam 10x right/left (verbal and tactile cues for technique) (Added to HEP- see below) Prone glute sets 10x (Added to HEP- see below) Sit to stand 10x (mild knee pain, better with wider stance) (Added to HEP- see below) Wall runner's calf stretch 30 sec hold 3x (Added to HEP- see below) Nu-Step 7 min L3 Les only (therapist monitoring response-no increased pain in knee or distal HS)   11/17/23: Examination completed, findings reviewed, pt educated on POC, HEP. Pt motivated to participate in PT and agreeable to attempt recommendations.    PATIENT EDUCATION:  Education details: [id number] Person educated: Patient Education method: Explanation Education comprehension: verbalized understanding, returned demonstration, verbal cues required, tactile cues required, and needs further education  HOME EXERCISE PROGRAM: Access Code: [email or web address] URL: [email or web address] Date: 11/22/2023 Prepared by: Glade Pesa  Exercises - Seated Table Hamstring Stretch   - 1 x daily - 7 x weekly - 1 sets - 3 reps - 30s holds - Supine Piriformis Stretch  - 1 x daily - 7 x weekly - 1 sets - 3 reps - 30s holds - Standing Gastroc Stretch on Step with Counter Support  - 1 x daily - 7 x weekly - 1 sets - 3 reps - 30s hold - Supine Hip Internal and External Rotation  - 1 x daily - 7 x weekly - 1 sets - 10 reps - 5s holds - Clamshell  - 1 x daily - 7 x weekly - 1 sets - 10 reps - Prone Gluteal Sets  - 1 x daily - 7 x weekly - 1 sets - 10 reps - Sit to Stand  - 1 x daily - 7 x weekly - 1 sets - 10 reps - Gastroc Stretch on Wall  - 1 x daily - 7 x weekly - 3 reps - 30 hold  ASSESSMENT:  CLINICAL IMPRESSION: Pt does well without weightbearing exercise with walking and sit to stand activities increasing pain.  Overall she is very flexible and may need to add more strengthening.  Omitted prone extension due to slight back pain.  She is tender only at the medial knee near the patella and joint space.    OBJECTIVE IMPAIRMENTS: Abnormal gait, decreased activity tolerance, decreased balance, decreased coordination, decreased endurance, decreased mobility, difficulty walking, decreased ROM, decreased strength, increased fascial restrictions, impaired perceived functional ability, increased muscle spasms, impaired flexibility, improper body mechanics, postural dysfunction, and pain.   ACTIVITY LIMITATIONS: carrying, lifting, bending, standing, squatting, stairs, transfers, and locomotion level  PARTICIPATION LIMITATIONS: meal prep, cleaning, laundry, community activity, and yard work  PERSONAL FACTORS: Fitness, Past/current experiences, Time since onset of injury/illness/exacerbation, and 1 comorbidity: medical history are also affecting  patient's functional outcome.   REHAB POTENTIAL: Good  CLINICAL DECISION MAKING: Stable/uncomplicated  EVALUATION COMPLEXITY: Low   GOALS: Goals reviewed with patient? Yes  SHORT TERM GOALS: Target date: 12/15/23 Pt to be I with HEP for  carry over and continuing recommendations for improved outcomes.   Baseline: Goal status: INITIAL  2.  Pt to demonstrate full ROM of Rt hip without pain for improved gait mechanics to tolerate return to PLOF for walking.  Baseline:  Goal status: INITIAL  3.  Pt to demonstrate x10 sit to stands without pain or compensation for improved hip strength for walking. Baseline:  Goal status: INITIAL  4.  Pt to demonstrate 5/5 bil hip strength without pain for improved ability to transfer from ground to standing to return to gardening Baseline:  Goal status: INITIAL   LONG TERM GOALS: Target date: 01/12/24  Pt to be I with advanced HEP for carry over and continuing recommendations for improved outcomes.   Baseline:  Goal status: INITIAL  2.  Pt to report ability to tolerate walking at least 1 mile with no more than 1/10 pain for improved QOL.  Baseline:  Goal status: INITIAL  3.  Pt to demonstrate improved within age related norms due to improved cadence and mechanics of gait for improved ability to complete grocery errands.  Baseline:  Goal status: INITIAL  4.  Pt to demonstrate improved 5xSTS to no more than 7s for decreased fall risk.  Baseline:  Goal status: INITIAL    PLAN:  PT FREQUENCY: 2x/week  PT DURATION: 8 weeks  PLANNED INTERVENTIONS: 97110-Therapeutic exercises, 97530- Therapeutic activity, 97112- Neuromuscular re-education, 97535- Self Care, 02859- Manual therapy, [phone]- Gait training, [phone]- Canalith repositioning, [id number]- Aquatic Therapy, [phone]- Electrical stimulation (manual), [id number]- Vasopneumatic device, [id number]- Traction (mechanical), [id number]- Ionotophoresis 4mg /ml Dexamethasone, 20560 (1-2 muscles), 20561 (3+ muscles)- Dry Needling, Patient/Family education, Taping, Joint mobilization, Spinal mobilization, Scar mobilization, DME instructions, Cryotherapy, Moist heat, and Biofeedback  PLAN FOR NEXT SESSION: gluteal activation ex's; Nu-Step Les only; stretch  hamstrings, adductors, hip strengthening, standing balance  Glade Pesa, PT 11/24/23 8:50 AM Phone: [phone] Fax: [phone]  [facility] [address] [place], [place] [address] Phone # [phone] Fax [phone]

## 2023-11-24 NOTE — Telephone Encounter (Signed)
 Spoke with patient to follow up from [facility] 9/3 and assess navigation needs. Patient denies any questions or concerns at this time. Encouraged her to call should anything arise. Patient verbalized understanding.

## 2023-11-24 NOTE — Telephone Encounter (Signed)
 [employer] 2021-05 - Specimen Collection Study to Evaluate Biomarkers in Subjects with Cancer   Followed up with patient via phone about study interest. Patient not interested, declined specimen study. Patient has contact information if she has any questions. Dr. Loretha notified.  Laury Quale, MPH  Clinical Research Coordinator

## 2023-11-28 ENCOUNTER — Encounter: Payer: Self-pay | Admitting: Physical Therapy

## 2023-11-28 ENCOUNTER — Encounter: Payer: Self-pay | Admitting: Genetic Counselor

## 2023-11-28 ENCOUNTER — Telehealth: Payer: Self-pay | Admitting: Genetic Counselor

## 2023-11-28 ENCOUNTER — Ambulatory Visit: Payer: Self-pay | Admitting: Genetic Counselor

## 2023-11-28 ENCOUNTER — Telehealth: Payer: Self-pay | Admitting: *Deleted

## 2023-11-28 ENCOUNTER — Encounter: Payer: Self-pay | Admitting: *Deleted

## 2023-11-28 DIAGNOSIS — Z1379 Encounter for other screening for genetic and chromosomal anomalies: Secondary | ICD-10-CM | POA: Insufficient documentation

## 2023-11-28 NOTE — Progress Notes (Signed)
 HPI:  Amanda Herring was previously seen in the [facility] due to a personal and family history of cancer and concerns regarding a hereditary predisposition to cancer. Please refer to our prior cancer genetics clinic note for more information regarding our discussion, assessment and recommendations, at the time. Amanda Herring's recent genetic test results were disclosed to her, as were recommendations warranted by these results. These results and recommendations are discussed in more detail below.  CANCER HISTORY:  Oncology History  Malignant neoplasm of upper-outer quadrant of right breast in female, estrogen receptor positive (HCC)  11/04/2023 Mammogram   There is a partly obscured oval mass in the retroareolar right breast corresponding to the palpable lump, measuring approximately 1.2 cm in size.   There is a small stable mass in the lateral retroareolar left breast. There no other defined masses, no areas of architectural distortion and no suspicious calcifications. The patient has retropectoral saline implants.  Targeted ultrasound is performed, showing a cystic and solid mass, predominantly solid, hypoechoic and superficial in the 12 o'clock position of the retroareolar right breast, corresponding to the lump. This measures 1.9 x 1.4 x 1.7 cm. There is internal blood flow within the solid components on color Doppler analysis. Margins are mostly ill-defined.   11/04/2023 Pathology Results   1. Breast, right, needle core biopsy, 12 o'clock :       INVASIVE MAMMARY CARCINOMA, SEE NOTE       TUBULE FORMATION: SCORE 3       NUCLEAR PLEOMORPHISM: SCORE 2       MITOTIC COUNT: SCORE 1       TOTAL SCORE: 6       OVERALL GRADE: 2       LYMPHOVASCULAR INVASION: NOT IDENTIFIED       CANCER LENGTH: 1.1 CM       CALCIFICATIONS: NOT IDENTIFIED       OTHER FINDINGS: NONE    Estrogen Receptor:  100%, POSITIVE, STRONG STAINING INTENSITY  Progesterone Receptor:  10%,  POSITIVE, WEAK STAINING INTENSITY  Proliferation Marker Ki67:  1%  Her 2 by FISH negative.    11/11/2023 Initial Diagnosis   Malignant neoplasm of upper-outer quadrant of right breast in female, estrogen receptor positive (HCC)   11/16/2023 Cancer Staging   Staging form: Breast, AJCC 8th Edition - Clinical stage from 11/16/2023: Stage IA (cT1c, cN0, cM0, G2, ER+, PR+, HER2-) - Signed by Lanell Donald Stagger, PA-C on 11/16/2023 Stage prefix: Initial diagnosis Method of lymph node assessment: Clinical Histologic grading system: 3 grade system   11/26/2023 Genetic Testing   Negative genetic testing on the CancerNext-Expanded+RNAinsight panel.  The report date is November 26, 2023.  The CancerNext-Expanded gene panel offered by [facility] and includes sequencing, rearrangement, and RNA analysis for the following 77 genes: AIP, ALK, APC, ATM, BAP1, BARD1, BMPR1A, BRCA1, BRCA2, BRIP1, CDC73, CDH1, CDK4, CDKN1B, CDKN2A, CEBPA, CHEK2, CTNNA1, DDX41, DICER1, ETV6, FH, FLCN, GATA2, LZTR1, MAX, MBD4, MEN1, MET, MLH1, MSH2, MSH3, MSH6, MUTYH, NF1, NF2, NTHL1, PALB2, PHOX2B, PMS2, POT1, PRKAR1A, PTCH1, PTEN, RAD51C, RAD51D, RB1, RET, RPS20, RUNX1, SDHA, SDHAF2, SDHB, SDHC, SDHD, SMAD4, SMARCA4, SMARCB1, SMARCE1, STK11, SUFU, TMEM127, TP53, TSC1, TSC2, VHL, and WT1 (sequencing and deletion/duplication); AXIN2, CTNNA1, DDX41, EGFR, HOXB13, KIT, MBD4, MITF, MSH3, PDGFRA, POLD1 and POLE (sequencing only); EPCAM and GREM1 (deletion/duplication only). RNA data is routinely analyzed for use in variant interpretation for all genes.      FAMILY HISTORY:  We obtained a detailed, 4-generation family history.  Significant diagnoses are listed below: Family History  Problem Relation Age of Onset   Skin cancer Father    Skin cancer Brother    Bladder Cancer Maternal Uncle    Breast cancer Neg Hx        The patient has three children who are cancer free.  Her oldest daughter reports having genetic testing  through her OB office. We do not have those results.  The patient has two brothers who are cancer free.  The patient's mother is deceased and her father is living.   The patient's mother was an only child.  She died of an aneurysm.  There is no reported history of cancer in the family.   The patient's father has skin cancer.  He has a brother with bladder cancer.  There is no other reported family history of cancer.   Amanda Herring is aware of previous family history of genetic testing for hereditary cancer risks. There is no reported Ashkenazi Jewish ancestry. There is no known consanguinity.  GENETIC TEST RESULTS: Genetic testing reported out on November 26, 2023 through the CancerNext-Expanded+RNAinsight cancer panel found no pathogenic mutations. The CancerNext-Expanded gene panel offered by [facility] and includes sequencing, rearrangement, and RNA analysis for the following 77 genes: AIP, ALK, APC, ATM, BAP1, BARD1, BMPR1A, BRCA1, BRCA2, BRIP1, CDC73, CDH1, CDK4, CDKN1B, CDKN2A, CEBPA, CHEK2, CTNNA1, DDX41, DICER1, ETV6, FH, FLCN, GATA2, LZTR1, MAX, MBD4, MEN1, MET, MLH1, MSH2, MSH3, MSH6, MUTYH, NF1, NF2, NTHL1, PALB2, PHOX2B, PMS2, POT1, PRKAR1A, PTCH1, PTEN, RAD51C, RAD51D, RB1, RET, RPS20, RUNX1, SDHA, SDHAF2, SDHB, SDHC, SDHD, SMAD4, SMARCA4, SMARCB1, SMARCE1, STK11, SUFU, TMEM127, TP53, TSC1, TSC2, VHL, and WT1 (sequencing and deletion/duplication); AXIN2, CTNNA1, DDX41, EGFR, HOXB13, KIT, MBD4, MITF, MSH3, PDGFRA, POLD1 and POLE (sequencing only); EPCAM and GREM1 (deletion/duplication only). RNA data is routinely analyzed for use in variant interpretation for all genes. The test report has been scanned into EPIC and is located under the Molecular Pathology section of the Results Review tab.  A portion of the result report is included below for reference.     We discussed with Amanda Herring that because current genetic testing is not perfect, it is possible there may be a gene mutation in  one of these genes that current testing cannot detect, but that chance is small.  We also discussed, that there could be another gene that has not yet been discovered, or that we have not yet tested, that is responsible for the cancer diagnoses in the family. It is also possible there is a hereditary cause for the cancer in the family that Amanda Herring did not inherit and therefore was not identified in her testing.  Therefore, it is important to remain in touch with cancer genetics in the future so that we can continue to offer Amanda Herring the most up to date genetic testing.   ADDITIONAL GENETIC TESTING: We discussed with Amanda Herring that her genetic testing was fairly extensive.  If there are genes identified to increase cancer risk that can be analyzed in the future, we would be happy to discuss and coordinate this testing at that time.    CANCER SCREENING RECOMMENDATIONS: Amanda Herring's test result is considered negative (normal).  This means that we have not identified a hereditary cause for her personal and family history of cancer at this time. Most cancers happen by chance and this negative test suggests that her personal and family history of cancer may fall into this category.    Possible reasons for  Amanda Herring's negative genetic test include:  1. There may be a gene mutation in one of these genes that current testing methods cannot detect but that chance is small.  2. There could be another gene that has not yet been discovered, or that we have not yet tested, that is responsible for the cancer diagnoses in the family.  3.  There may be no hereditary risk for cancer in the family. The cancers in Amanda Herring and/or her family may be sporadic/familial or due to other genetic and environmental factors. 4. It is also possible there is a hereditary cause for the cancer in the family that Amanda Herring did not inherit.  Therefore, it is recommended she continue to follow the cancer management and  screening guidelines provided by her oncology and primary healthcare provider. An individual's cancer risk and medical management are not determined by genetic test results alone. Overall cancer risk assessment incorporates additional factors, including personal medical history, family history, and any available genetic information that may result in a personalized plan for cancer prevention and surveillance  RECOMMENDATIONS FOR FAMILY MEMBERS:   Since she did not inherit a identifiable mutation in a cancer predisposition gene included on this panel, her children could not have inherited a known mutation from her in one of these genes. Individuals in this family might be at some increased risk of developing cancer, over the general population risk, simply due to the family history of cancer.  We recommended women in this family have a yearly mammogram beginning at age 10, or 34 years younger than the earliest onset of cancer, an annual clinical breast exam, and perform monthly breast self-exams. Women in this family should also have a gynecological exam as recommended by their primary provider. All family members should be referred for colonoscopy starting at age 62, or 7 years younger than the earliest onset of cancer.  FOLLOW-UP: Lastly, we discussed with Amanda Herring that cancer genetics is a rapidly advancing field and it is possible that new genetic tests will be appropriate for her and/or her family members in the future. We encouraged her to remain in contact with cancer genetics on an annual basis so we can update her personal and family histories and let her know of advances in cancer genetics that may benefit this family.   Our contact number was provided. Amanda Herring's questions were answered to her satisfaction, and she knows she is welcome to call us  at anytime with additional questions or concerns.   Darice Monte, MS, [facility] Licensed, Certified Genetic Counselor [email or web address]

## 2023-11-28 NOTE — Telephone Encounter (Signed)
 Patient called and left message for navigator wanting to sign up for Advance Directives clinic. Forwarded this info to [employer] chaplain and she has called patient back and registered her.

## 2023-11-28 NOTE — Telephone Encounter (Signed)
 I contacted  Amanda Herring to discuss her genetic testing results. No pathogenic variants were identified in the 77 genes analyzed. Discussed that we do not know why she has breast cancer or why there is cancer in the family. It could be due to a different gene that we are not testing, or maybe our current technology may not be able to pick something up.  It will be important for her to keep in contact with genetics to keep up with whether additional testing may be needed.Detailed clinic note to follow.   The test report will be scanned into EPIC and will be located under the Molecular Pathology section of the Results Review tab.  A portion of the result report is included below for reference.

## 2023-11-29 ENCOUNTER — Ambulatory Visit

## 2023-11-29 DIAGNOSIS — M6281 Muscle weakness (generalized): Secondary | ICD-10-CM

## 2023-11-29 DIAGNOSIS — R279 Unspecified lack of coordination: Secondary | ICD-10-CM

## 2023-11-29 DIAGNOSIS — M79604 Pain in right leg: Secondary | ICD-10-CM

## 2023-11-29 NOTE — Therapy (Signed)
 OUTPATIENT PHYSICAL THERAPY LOWER EXTREMITY ORTHO TREATMENT   Patient Name: Amanda Herring MRN: [id number] DOB:12-Oct-1955, 68 y.o., female Today's Date: 11/29/2023  END OF SESSION:  PT End of Session - 11/29/23 0809     Visit Number 4    Number of Visits 16    Date for PT Re-Evaluation 01/12/24    Authorization Type [employer]    PT Start Time 0806    PT Stop Time 0852    PT Time Calculation (min) 46 min    Activity Tolerance Patient tolerated treatment well    Behavior During Therapy [facility] for tasks assessed/performed           Past Medical History:  Diagnosis Date   Family history of bladder cancer    Hepatitis C    Hypertension    Hypothyroidism    Kidney stone    Past Surgical History:  Procedure Laterality Date   BREAST BIOPSY Right 11/04/2023   US  RT BREAST BX W LOC DEV 1ST LESION IMG BX SPEC US  GUIDE 11/04/2023 [facility]   BREAST EXCISIONAL BIOPSY Bilateral    1981/1980   BREAST LUMPECTOMY Left    1998   BREAST SURGERY     augmentation   Patient Active Problem List   Diagnosis Date Noted   Genetic testing 11/28/2023   Family history of bladder cancer    Malignant neoplasm of upper-outer quadrant of right breast in female, estrogen receptor positive (HCC) 11/11/2023    PCP: Dyane Anthony RAMAN, FNP   REFERRING PROVIDER:   Edna Toribio LABOR, MD    REFERRING DIAG: RIGHT PES BURSA/RIGHT HAMSTRING STRAIN   THERAPY DIAG:  Muscle weakness (generalized)  Pain in right leg  Unspecified lack of coordination  Rationale for Evaluation and Treatment: Rehabilitation  ONSET DATE: jan 2025  SUBJECTIVE:   SUBJECTIVE STATEMENT: I've been doing the exercises and they are helping some. I'm walking for 10 mins at a time and if I do my stretches first then I tolerate the walking better. I got my surgery scheduled for 10/1 so [facility] cancelled some of my appts after that until the MD clears me to return.   PERTINENT HISTORY: Malignant  neoplasm of upper-outer quadrant of right breast in female, estrogen receptor positive (HCC)  PAIN:  Are you having pain? Yes: NPRS scale: 4-5/10,  Pain location: Rt medial knee to inferior to patella Pain description: sharp (when its high), dull achy (constant) Aggravating factors: walking, carrying 74 month old grand daughter, end of the day Relieving factors: rest, propping, ice, advil    PRECAUTIONS: Other: cancer pt  RED FLAGS: None   WEIGHT BEARING RESTRICTIONS: No  FALLS:  Has patient fallen in last 6 months? No  LIVING ENVIRONMENT: Lives with: lives alone Lives in: House/apartment Stairs: No Has following equipment at home: None  OCCUPATION: retired   PLOF: Independent  PATIENT GOALS: to have less pain to go back to walking (was doing 1-2 miles)  NEXT MD VISIT: nothing planned   OBJECTIVE:  Note: Objective measures were completed at Evaluation unless otherwise noted.  DIAGNOSTIC FINDINGS: no imagining for hamstring or knee  PATIENT SURVEYS:  LEFS  Extreme difficulty/unable (0), Quite a bit of difficulty (1), Moderate difficulty (2), Little difficulty (3), No difficulty (4) Survey date:  11/17/23  Any of your usual work, housework or school activities 1  2. Usual hobbies, recreational or sporting activities 0  3. Getting into/out of the bath 1  4. Walking between rooms 1  5.  Putting on socks/shoes 2  6. Squatting  1  7. Lifting an object, like a bag of groceries from the floor 1  8. Performing light activities around your home 1  9. Performing heavy activities around your home 0  10. Getting into/out of a car 1  11. Walking 2 blocks 0  12. Walking 1 mile 0  13. Going up/down 10 stairs (1 flight) 1  14. Standing for 1 hour 1  15.  sitting for 1 hour 4  16. Running on even ground 0  17. Running on uneven ground 0  18. Making sharp turns while running fast 0  19. Hopping  0  20. Rolling over in bed 3  Score total:  18     COGNITION: Overall cognitive  status: Within functional limits for tasks assessed     SENSATION: WFL  EDEMA:  Circumferential: 49cm diameter (7cm below patella) Rt  44cm Lt same position  MUSCLE LENGTH: Rt hamstring limited by 25% with palpable tension at medial hamstring   POSTURE: posterior pelvic tilt  PALPATION: TTP at Rt medial knee joint line, medial hamstrings, medial fibers of quad, and distal adductor  LOWER EXTREMITY ROM:  Full ROM at knee ext and flexion without pain, Rt hip ER at end range at replicated pain but full range.   LOWER EXTREMITY MMT:  Bil hips grossly 4/5 except Rt hip flexion 3+/5  LOWER EXTREMITY SPECIAL TESTS:  Hip special tests: SI compression test: neg , SI distraction test: negative, Hip scouring test: negative, and Piriformis test: positive    FUNCTIONAL TESTS:  5 times sit to stand: 11s no AD lowest table setting 2 minute walk test: 384' no AD  GAIT: Distance walked: TWO MINUTE WALK TEST *individual walks without assistance for 2 minutes and the distance is measured *start timing when the individual is instructed to "Go" *stop timing at 2 minutes *assistive devices can be used but should be kept consistent and  documented from test to test  *if physical assistance is required to walk, this should not be performed * a measuring wheel is helpful to determine distance walked *should be performed at the fastest speed possible   Gender Age in years (# in study) Mean distance in meters Female  18-54 (260)   200.9 55-59 (23)   191.0 60-64 (29)   179.1 (587 feet) 65-69 (22)   184.2 70-74 (32)   172.2 75-79 (19)   157.6 80-85 (32)   144.1  Female 18-54 (539)   183.0 55-59 (30)   176.4 60-64 (48)   166.4 65-69 (22)   155.2 70-74 (33)   145.9 75-79 (14)   140.9 80-85 (34)   134.3  Assistive device utilized: None Level of assistance: Complete Independence Comments: trunk flexion, trunk lateral sway for limb advancement, increased step length at Rt with increased  pain with attempts to shorten step, decreased cadence  TREATMENT DATE:   11/29/23: Therapeutic Exercise Seated HS stretch bil x 2 reps, 20 sec holds NuStep Level 4, LE 7 x 10 mins, 980 steps with PTA monitoring pt response, pt requested longer time as she felt good after last time this doesn't increase p! Runner's lunge at wall x 3 reps, x 30 sec Hip Machine 25#  x 10 for 3 way raises, pain in Rt medial knee with FWB during Lt abd so limited these reps to 8, did well with all others Power Plate for bil HS stretch x 30 sec each SLR x 10, then 1# on ankle x 10 bil, pt reports no pain but increased difficulty, then bil SLR with er for VMO activation x 10 each with tactile cues to limit end ROM End of session: Figure 4 piriformis stretch seated EOB x 3 reps, 20-30 sec holds reviewing proper technique as pt was working to pull ankle closer to groin, but instructed her that keeping knee level should be more of focus with erect posture, she reports feeling better stretch once technique improved Seated EOB with Rt LE extended on mat to her Rt and then forward lean for groin/inner thigh stretch x 3 reps, 10 sec holds, then same on Lt. Pt repots feeling very good stretch with this. Therapeutic Activities  Bridging x 10, then bridging with purple ball squeeze x 10, then bridging with red theraband above knees x 10. No pain with bridges Sit to stand with green theraband above knees x 10, then squat with band above knees in front of mat x 10 with VC's for full hip ext and glut/core engagement at end motion In // bars: Slow, controlled high knee marching with 3 sec SLS 2 laps, bil sidestepping 1 lap each but increased knee pain so stopped, then front and retro heel-toe walking 2 laps, then same on airex 2 laps each, no increased knee pain with this    11/24/23 Completed breast cancer  baseline to fill in missing information Nu-Step 7 min L3 LEs only (therapist monitoring response-no increased pain in knee or distal HS) Seated hamstring stretch 3x20  Supine piriformis stretch (knee toward opposite shoulder): modified with hands behind knee instead of on top for comfort - first stretch was a bit painful in the knee and then it improved.   (Groin stretch easy)  Sidelying clam 2x10 right/left (verbal and tactile cues for technique) SLR 2x10 bil Prone glute sets 10x 3 hold  Prone extension bil (increased back pain) so stopped after 1  Wall runner's calf stretch 30 sec hold 3x  9/9: Discussion of current status including effects of walking 1 mile (2 days of severe pain); discussed trial of 10 minutes, no hills, shorter step lengths) Review of initial HEP with re-print given: pt does not have an indoor step for calf stretch so we added wall runner's calf stretch Supine piriformis stretch (knee toward opposite shoulder): modified with hands behind knee instead of on top for comfort Sidelying clam 10x right/left (verbal and tactile cues for technique) (Added to HEP- see below) Prone glute sets 10x (Added to HEP- see below) Sit to stand 10x (mild knee pain, better with wider stance) (Added to HEP- see below) Wall runner's calf stretch 30 sec hold 3x (Added to HEP- see below) Nu-Step 7 min L3 Les only (therapist monitoring response-no increased pain in knee or distal HS)   PATIENT EDUCATION:  Education details: [email or web address] Person educated: Patient Education method: Explanation Education comprehension: verbalized understanding, returned demonstration, verbal cues  required, tactile cues required, and needs further education  HOME EXERCISE PROGRAM: Access Code: [email or web address] URL: [email or web address] Date: 11/22/2023 Prepared by: Glade Pesa  Exercises - Seated Table Hamstring Stretch  - 1 x daily - 7 x weekly - 1 sets - 3 reps - 30s holds - Supine Piriformis  Stretch  - 1 x daily - 7 x weekly - 1 sets - 3 reps - 30s holds - Standing Gastroc Stretch on Step with Counter Support  - 1 x daily - 7 x weekly - 1 sets - 3 reps - 30s hold - Supine Hip Internal and External Rotation  - 1 x daily - 7 x weekly - 1 sets - 10 reps - 5s holds - Clamshell  - 1 x daily - 7 x weekly - 1 sets - 10 reps - Prone Gluteal Sets  - 1 x daily - 7 x weekly - 1 sets - 10 reps - Sit to Stand  - 1 x daily - 7 x weekly - 1 sets - 10 reps - Gastroc Stretch on Wall  - 1 x daily - 7 x weekly - 3 reps - 30 hold  ASSESSMENT:  CLINICAL IMPRESSION: Pt conts with ttp at Lt medial knee and joint space. She was able to increase time on NuStep and reports no pain with this. Added bil hip 3 way raises with hip machine which she tolerated well with Rt LE, had increased pain in Rt knee with FWB when doing Lt abd so limited reps with this. Worked within pain tolerance, encouraging pt to do same when at home as she reports she pushes herself and is working on learning her limitations. Also educated her about being hypermobile which can increase her risk of pulling a muscle due to increased mobility making it that much more important to work on consistency with her HEP to strengthen elongated muscles and to cont stretching where she feels tight. Pt able to verbalize good understanding.   OBJECTIVE IMPAIRMENTS: Abnormal gait, decreased activity tolerance, decreased balance, decreased coordination, decreased endurance, decreased mobility, difficulty walking, decreased ROM, decreased strength, increased fascial restrictions, impaired perceived functional ability, increased muscle spasms, impaired flexibility, improper body mechanics, postural dysfunction, and pain.   ACTIVITY LIMITATIONS: carrying, lifting, bending, standing, squatting, stairs, transfers, and locomotion level  PARTICIPATION LIMITATIONS: meal prep, cleaning, laundry, community activity, and yard work  PERSONAL FACTORS: Fitness,  Past/current experiences, Time since onset of injury/illness/exacerbation, and 1 comorbidity: medical history are also affecting patient's functional outcome.   REHAB POTENTIAL: Good  CLINICAL DECISION MAKING: Stable/uncomplicated  EVALUATION COMPLEXITY: Low   GOALS: Goals reviewed with patient? Yes  SHORT TERM GOALS: Target date: 12/15/23 Pt to be I with HEP for carry over and continuing recommendations for improved outcomes.   Baseline: Goal status: INITIAL  2.  Pt to demonstrate full ROM of Rt hip without pain for improved gait mechanics to tolerate return to PLOF for walking.  Baseline:  Goal status: INITIAL  3.  Pt to demonstrate x10 sit to stands without pain or compensation for improved hip strength for walking. Baseline:  Goal status: INITIAL  4.  Pt to demonstrate 5/5 bil hip strength without pain for improved ability to transfer from ground to standing to return to gardening Baseline:  Goal status: INITIAL   LONG TERM GOALS: Target date: 01/12/24  Pt to be I with advanced HEP for carry over and continuing recommendations for improved outcomes.   Baseline:  Goal status: INITIAL  2.  Pt to report  ability to tolerate walking at least 1 mile with no more than 1/10 pain for improved QOL.  Baseline:  Goal status: INITIAL  3.  Pt to demonstrate improved within age related norms due to improved cadence and mechanics of gait for improved ability to complete grocery errands.  Baseline:  Goal status: INITIAL  4.  Pt to demonstrate improved 5xSTS to no more than 7s for decreased fall risk.  Baseline:  Goal status: INITIAL    PLAN:  PT FREQUENCY: 2x/week  PT DURATION: 8 weeks  PLANNED INTERVENTIONS: 97110-Therapeutic exercises, 97530- Therapeutic activity, [id number]- Neuromuscular re-education, 97535- Self Care, [id number]- Manual therapy, [id number]- Gait training, [phone]- Canalith repositioning, [id number]- Aquatic Therapy, [phone]- Electrical stimulation (manual), [id number]-  Vasopneumatic device, [id number]- Traction (mechanical), [id number]- Ionotophoresis 4mg /ml Dexamethasone, [id number] (1-2 muscles), [id number] (3+ muscles)- Dry Needling, Patient/Family education, Taping, Joint mobilization, Spinal mobilization, Scar mobilization, DME instructions, Cryotherapy, Moist heat, and Biofeedback  PLAN FOR NEXT SESSION: Working stretches into ADLs more consistently? Gluteal activation ex's; Nu-Step Les only; stretch hamstrings, adductors, hip strengthening, standing balance  Berwyn Knights, PTA 11/29/23 9:08 AM   [facility] [address], [address] [place], [place] [address] Phone # [phone] Fax [phone]

## 2023-12-01 ENCOUNTER — Ambulatory Visit: Admitting: Physical Therapy

## 2023-12-01 DIAGNOSIS — M6281 Muscle weakness (generalized): Secondary | ICD-10-CM | POA: Diagnosis not present

## 2023-12-01 DIAGNOSIS — M79604 Pain in right leg: Secondary | ICD-10-CM

## 2023-12-01 NOTE — Therapy (Signed)
 OUTPATIENT PHYSICAL THERAPY LOWER EXTREMITY ORTHO TREATMENT   Patient Name: Amanda Herring MRN: [id number] DOB:April 15, 1955, 68 y.o., female Today's Date: 12/01/2023  END OF SESSION:  PT End of Session - 12/01/23 0754     Visit Number 5    Number of Visits 16    Date for PT Re-Evaluation 01/12/24    Authorization Type [employer]    PT Start Time 0755    PT Stop Time 0837    PT Time Calculation (min) 42 min    Activity Tolerance Patient tolerated treatment well           Past Medical History:  Diagnosis Date   Family history of bladder cancer    Hepatitis C    Hypertension    Hypothyroidism    Kidney stone    Past Surgical History:  Procedure Laterality Date   BREAST BIOPSY Right 11/04/2023   US  RT BREAST BX W LOC DEV 1ST LESION IMG BX SPEC US  GUIDE 11/04/2023 [facility]   BREAST EXCISIONAL BIOPSY Bilateral    1981/1980   BREAST LUMPECTOMY Left    1998   BREAST SURGERY     augmentation   Patient Active Problem List   Diagnosis Date Noted   Genetic testing 11/28/2023   Family history of bladder cancer    Malignant neoplasm of upper-outer quadrant of right breast in female, estrogen receptor positive (HCC) 11/11/2023    PCP: Dyane Anthony RAMAN, FNP   REFERRING PROVIDER:   Edna Toribio LABOR, MD    REFERRING DIAG: RIGHT PES BURSA/RIGHT HAMSTRING STRAIN   THERAPY DIAG:  Muscle weakness (generalized)  Pain in right leg  Rationale for Evaluation and Treatment: Rehabilitation  ONSET DATE: jan 2025  SUBJECTIVE:   SUBJECTIVE STATEMENT: I haven't had time to stretch this morning.  Fed 2 dogs and fed the baby already this morning.  Able to walk 12 minutes then knee gets tight. I'm working on doing it twice a day. Yesterday had medial knee pain about 6/10.  The clam really loosens it up.    PERTINENT HISTORY: Malignant neoplasm of upper-outer quadrant of right breast in female, estrogen receptor positive (HCC)  PAIN:  Are you having  pain? Yes: NPRS scale: 3 tenderness/10,  Pain location: Rt medial knee to inferior to patella Pain description: sharp (when its high), dull achy (constant) Aggravating factors: walking, carrying 61 month old grand daughter, end of the day Relieving factors: rest, propping, ice, advil    PRECAUTIONS: Other: cancer pt  RED FLAGS: None   WEIGHT BEARING RESTRICTIONS: No  FALLS:  Has patient fallen in last 6 months? No  LIVING ENVIRONMENT: Lives with: lives alone Lives in: House/apartment Stairs: No Has following equipment at home: None  OCCUPATION: retired   PLOF: Independent  PATIENT GOALS: to have less pain to go back to walking (was doing 1-2 miles)  NEXT MD VISIT: nothing planned   OBJECTIVE:  Note: Objective measures were completed at Evaluation unless otherwise noted.  DIAGNOSTIC FINDINGS: no imagining for hamstring or knee  PATIENT SURVEYS:  LEFS  Extreme difficulty/unable (0), Quite a bit of difficulty (1), Moderate difficulty (2), Little difficulty (3), No difficulty (4) Survey date:  11/17/23  Any of your usual work, housework or school activities 1  2. Usual hobbies, recreational or sporting activities 0  3. Getting into/out of the bath 1  4. Walking between rooms 1  5. Putting on socks/shoes 2  6. Squatting  1  7. Lifting an object, like a  bag of groceries from the floor 1  8. Performing light activities around your home 1  9. Performing heavy activities around your home 0  10. Getting into/out of a car 1  11. Walking 2 blocks 0  12. Walking 1 mile 0  13. Going up/down 10 stairs (1 flight) 1  14. Standing for 1 hour 1  15.  sitting for 1 hour 4  16. Running on even ground 0  17. Running on uneven ground 0  18. Making sharp turns while running fast 0  19. Hopping  0  20. Rolling over in bed 3  Score total:  18     COGNITION: Overall cognitive status: Within functional limits for tasks assessed     SENSATION: WFL  EDEMA:  Circumferential: 49cm  diameter (7cm below patella) Rt  44cm Lt same position  MUSCLE LENGTH: Rt hamstring limited by 25% with palpable tension at medial hamstring   POSTURE: posterior pelvic tilt  PALPATION: TTP at Rt medial knee joint line, medial hamstrings, medial fibers of quad, and distal adductor  LOWER EXTREMITY ROM:  Full ROM at knee ext and flexion without pain, Rt hip ER at end range at replicated pain but full range.   LOWER EXTREMITY MMT:  Bil hips grossly 4/5 except Rt hip flexion 3+/5  LOWER EXTREMITY SPECIAL TESTS:  Hip special tests: SI compression test: neg , SI distraction test: negative, Hip scouring test: negative, and Piriformis test: positive    FUNCTIONAL TESTS:  5 times sit to stand: 11s no AD lowest table setting 2 minute walk test: 384' no AD  GAIT: Distance walked: TWO MINUTE WALK TEST *individual walks without assistance for 2 minutes and the distance is measured *start timing when the individual is instructed to "Go" *stop timing at 2 minutes *assistive devices can be used but should be kept consistent and  documented from test to test  *if physical assistance is required to walk, this should not be performed * a measuring wheel is helpful to determine distance walked *should be performed at the fastest speed possible   Gender Age in years (# in study) Mean distance in meters Female  18-54 (260)   200.9 55-59 (23)   191.0 60-64 (29)   179.1 (587 feet) 65-69 (22)   184.2 70-74 (32)   172.2 75-79 (19)   157.6 80-85 (32)   144.1  Female 18-54 (539)   183.0 55-59 (30)   176.4 60-64 (48)   166.4 65-69 (22)   155.2 70-74 (33)   145.9 75-79 (14)   140.9 80-85 (34)   134.3  Assistive device utilized: None Level of assistance: Complete Independence Comments: trunk flexion, trunk lateral sway for limb advancement, increased step length at Rt with increased pain with attempts to shorten step, decreased cadence  TREATMENT DATE:   9/18: Nu-Step 7 min L5 Les only (therapist monitoring response and getting status update) Supine knee flexion with stretch out strap 10x Supine HS strap with 3x, with side to side bias 10x Sidelying clam with added green band 10x right/left (verbal and tactile cues for technique)  Sit to stand  with green band around thighs to activate glutes 2 sets of 5  LAQ 6# 10x 2 (feels heavy but not painful) Standing green band around thighs: hip abduction 5x, hip diagonal extensions 5x, 3 way clocks 5x right/left (challenging to WB on right but not painful) Bil heel raises 10x Retro step/ mini reverse lunge (as if going to kneel) with bil UE support 5x right/left Pt given green band for home use  11/29/23: Therapeutic Exercise Seated HS stretch bil x 2 reps, 20 sec holds NuStep Level 4, LE 7 x 10 mins, 980 steps with PTA monitoring pt response, pt requested longer time as she felt good after last time this doesn't increase p! Runner's lunge at wall x 3 reps, x 30 sec Hip Machine 25#  x 10 for 3 way raises, pain in Rt medial knee with FWB during Lt abd so limited these reps to 8, did well with all others Power Plate for bil HS stretch x 30 sec each SLR x 10, then 1# on ankle x 10 bil, pt reports no pain but increased difficulty, then bil SLR with er for VMO activation x 10 each with tactile cues to limit end ROM End of session: Figure 4 piriformis stretch seated EOB x 3 reps, 20-30 sec holds reviewing proper technique as pt was working to pull ankle closer to groin, but instructed her that keeping knee level should be more of focus with erect posture, she reports feeling better stretch once technique improved Seated EOB with Rt LE extended on mat to her Rt and then forward lean for groin/inner thigh stretch x 3 reps, 10 sec holds, then same on Lt. Pt repots feeling very good stretch with this. Therapeutic Activities   Bridging x 10, then bridging with purple ball squeeze x 10, then bridging with red theraband above knees x 10. No pain with bridges Sit to stand with green theraband above knees x 10, then squat with band above knees in front of mat x 10 with VC's for full hip ext and glut/core engagement at end motion In // bars: Slow, controlled high knee marching with 3 sec SLS 2 laps, bil sidestepping 1 lap each but increased knee pain so stopped, then front and retro heel-toe walking 2 laps, then same on airex 2 laps each, no increased knee pain with this    11/24/23 Completed breast cancer baseline to fill in missing information Nu-Step 7 min L3 LEs only (therapist monitoring response-no increased pain in knee or distal HS) Seated hamstring stretch 3x20  Supine piriformis stretch (knee toward opposite shoulder): modified with hands behind knee instead of on top for comfort - first stretch was a bit painful in the knee and then it improved.   (Groin stretch easy)  Sidelying clam 2x10 right/left (verbal and tactile cues for technique) SLR 2x10 bil Prone glute sets 10x 3 hold  Prone extension bil (increased back pain) so stopped after 1  Wall runner's calf stretch 30 sec hold 3x  9/9: Discussion of current status including effects of walking 1 mile (2 days of severe pain); discussed trial of 10 minutes, no hills, shorter step lengths) Review of initial HEP with re-print  given: pt does not have an indoor step for calf stretch so we added wall runner's calf stretch Supine piriformis stretch (knee toward opposite shoulder): modified with hands behind knee instead of on top for comfort Sidelying clam 10x right/left (verbal and tactile cues for technique) (Added to HEP- see below) Prone glute sets 10x (Added to HEP- see below) Sit to stand 10x (mild knee pain, better with wider stance) (Added to HEP- see below) Wall runner's calf stretch 30 sec hold 3x (Added to HEP- see below) Nu-Step 7 min L3 Les only  (therapist monitoring response-no increased pain in knee or distal HS)   PATIENT EDUCATION:  Education details: [email or web address] Person educated: Patient Education method: Explanation Education comprehension: verbalized understanding, returned demonstration, verbal cues required, tactile cues required, and needs further education  HOME EXERCISE PROGRAM: Access Code: [email or web address] URL: [email or web address] Date: 11/22/2023 Prepared by: Glade Pesa  Exercises - Seated Table Hamstring Stretch  - 1 x daily - 7 x weekly - 1 sets - 3 reps - 30s holds - Supine Piriformis Stretch  - 1 x daily - 7 x weekly - 1 sets - 3 reps - 30s holds - Standing Gastroc Stretch on Step with Counter Support  - 1 x daily - 7 x weekly - 1 sets - 3 reps - 30s hold - Supine Hip Internal and External Rotation  - 1 x daily - 7 x weekly - 1 sets - 10 reps - 5s holds - Clamshell  - 1 x daily - 7 x weekly - 1 sets - 10 reps - Prone Gluteal Sets  - 1 x daily - 7 x weekly - 1 sets - 10 reps - Sit to Stand  - 1 x daily - 7 x weekly - 1 sets - 10 reps - Gastroc Stretch on Wall  - 1 x daily - 7 x weekly - 3 reps - 30 hold  ASSESSMENT:  CLINICAL IMPRESSION: Therapist instructing in exercises targeting quads and gluteals in unloaded and partially loaded positions needed for sit to stand and kneeling. Therapist closely monitoring response with some modifications for pain and providing cues for technique.  Added resistance to the clam ex as a progression of strengthening.        OBJECTIVE IMPAIRMENTS: Abnormal gait, decreased activity tolerance, decreased balance, decreased coordination, decreased endurance, decreased mobility, difficulty walking, decreased ROM, decreased strength, increased fascial restrictions, impaired perceived functional ability, increased muscle spasms, impaired flexibility, improper body mechanics, postural dysfunction, and pain.   ACTIVITY LIMITATIONS: carrying, lifting, bending, standing,  squatting, stairs, transfers, and locomotion level  PARTICIPATION LIMITATIONS: meal prep, cleaning, laundry, community activity, and yard work  PERSONAL FACTORS: Fitness, Past/current experiences, Time since onset of injury/illness/exacerbation, and 1 comorbidity: medical history are also affecting patient's functional outcome.   REHAB POTENTIAL: Good  CLINICAL DECISION MAKING: Stable/uncomplicated  EVALUATION COMPLEXITY: Low   GOALS: Goals reviewed with patient? Yes  SHORT TERM GOALS: Target date: 12/15/23 Pt to be I with HEP for carry over and continuing recommendations for improved outcomes.   Baseline: Goal status: INITIAL  2.  Pt to demonstrate full ROM of Rt hip without pain for improved gait mechanics to tolerate return to PLOF for walking.  Baseline:  Goal status: INITIAL  3.  Pt to demonstrate x10 sit to stands without pain or compensation for improved hip strength for walking. Baseline:  Goal status: INITIAL  4.  Pt to demonstrate 5/5 bil hip strength without pain for improved ability to transfer from ground to standing to  return to gardening Baseline:  Goal status: INITIAL   LONG TERM GOALS: Target date: 01/12/24  Pt to be I with advanced HEP for carry over and continuing recommendations for improved outcomes.   Baseline:  Goal status: INITIAL  2.  Pt to report ability to tolerate walking at least 1 mile with no more than 1/10 pain for improved QOL.  Baseline:  Goal status: INITIAL  3.  Pt to demonstrate improved within age related norms due to improved cadence and mechanics of gait for improved ability to complete grocery errands.  Baseline:  Goal status: INITIAL  4.  Pt to demonstrate improved 5xSTS to no more than 7s for decreased fall risk.  Baseline:  Goal status: INITIAL    PLAN:  PT FREQUENCY: 2x/week  PT DURATION: 8 weeks  PLANNED INTERVENTIONS: 97110-Therapeutic exercises, 97530- Therapeutic activity, 97112- Neuromuscular  re-education, 97535- Self Care, 02859- Manual therapy, [phone]- Gait training, [phone]- Canalith repositioning, [id number]- Aquatic Therapy, [phone]- Electrical stimulation (manual), [id number]- Vasopneumatic device, [id number]- Traction (mechanical), [id number]- Ionotophoresis 4mg /ml Dexamethasone, 20560 (1-2 muscles), 20561 (3+ muscles)- Dry Needling, Patient/Family education, Taping, Joint mobilization, Spinal mobilization, Scar mobilization, DME instructions, Cryotherapy, Moist heat, and Biofeedback  PLAN FOR NEXT SESSION: Gluteal activation ex's; Nu-Step Les only; stretch hamstrings, adductors, hip strengthening, standing balance  Glade Pesa, PT 12/01/23 10:26 PM Phone: [phone] Fax: [phone]  [facility] [address], [address] [place], [place] [address] Phone # [phone] Fax [phone]

## 2023-12-06 ENCOUNTER — Ambulatory Visit

## 2023-12-06 DIAGNOSIS — M6281 Muscle weakness (generalized): Secondary | ICD-10-CM | POA: Diagnosis not present

## 2023-12-06 DIAGNOSIS — M79604 Pain in right leg: Secondary | ICD-10-CM

## 2023-12-06 NOTE — Therapy (Signed)
 OUTPATIENT PHYSICAL THERAPY LOWER EXTREMITY ORTHO TREATMENT   Patient Name: Amanda Herring MRN: [id number] DOB:08/23/1955, 68 y.o., female Today's Date: 12/06/2023  END OF SESSION:  PT End of Session - 12/06/23 0804     Visit Number 6    Number of Visits 16    Date for Recertification  01/12/24    Authorization Type [employer]    PT Start Time 0801    PT Stop Time 0837   pt reports needing to leave early for appt [facility]   PT Time Calculation (min) 36 min    Activity Tolerance Patient tolerated treatment well    Behavior During Therapy [facility] for tasks assessed/performed           Past Medical History:  Diagnosis Date   Family history of bladder cancer    Hepatitis C    Hypertension    Hypothyroidism    Kidney stone    Past Surgical History:  Procedure Laterality Date   BREAST BIOPSY Right 11/04/2023   US  RT BREAST BX W LOC DEV 1ST LESION IMG BX SPEC US  GUIDE 11/04/2023 [facility]   BREAST EXCISIONAL BIOPSY Bilateral    1981/1980   BREAST LUMPECTOMY Left    1998   BREAST SURGERY     augmentation   Patient Active Problem List   Diagnosis Date Noted   Genetic testing 11/28/2023   Family history of bladder cancer    Malignant neoplasm of upper-outer quadrant of right breast in female, estrogen receptor positive (HCC) 11/11/2023    PCP: Dyane Anthony RAMAN, FNP   REFERRING PROVIDER:   Edna Toribio LABOR, MD    REFERRING DIAG: RIGHT PES BURSA/RIGHT HAMSTRING STRAIN   THERAPY DIAG:  Muscle weakness (generalized)  Pain in right leg  Rationale for Evaluation and Treatment: Rehabilitation  ONSET DATE: jan 2025  SUBJECTIVE:   SUBJECTIVE STATEMENT: I'm slowly walking a little more each time. I'm going to try to get up to 15 mins next time, maybe later today. I had increased pain after my walk yesterday and normally my stretches help alleviate it but I didn't get to go thru all of them because I had my granddaughter. I feel  great after therapy though and normally don't have any more pain fo the rest of that day.   PERTINENT HISTORY: Malignant neoplasm of upper-outer quadrant of right breast in female, estrogen receptor positive (HCC)  PAIN:  Are you having pain? Yes: NPRS scale: 4/10,  Pain location: Rt medial knee to inferior to patella Pain description: sharp (when its high), dull achy (constant) Aggravating factors: walking, carrying 84 month old grand daughter, end of the day Relieving factors: rest, propping, ice, advil    PRECAUTIONS: Other: cancer pt  RED FLAGS: None   WEIGHT BEARING RESTRICTIONS: No  FALLS:  Has patient fallen in last 6 months? No  LIVING ENVIRONMENT: Lives with: lives alone Lives in: House/apartment Stairs: No Has following equipment at home: None  OCCUPATION: retired   PLOF: Independent  PATIENT GOALS: to have less pain to go back to walking (was doing 1-2 miles)  NEXT MD VISIT: nothing planned   OBJECTIVE:  Note: Objective measures were completed at Evaluation unless otherwise noted.  DIAGNOSTIC FINDINGS: no imagining for hamstring or knee  PATIENT SURVEYS:  LEFS  Extreme difficulty/unable (0), Quite a bit of difficulty (1), Moderate difficulty (2), Little difficulty (3), No difficulty (4) Survey date:  11/17/23  Any of your usual work, housework or school  activities 1  2. Usual hobbies, recreational or sporting activities 0  3. Getting into/out of the bath 1  4. Walking between rooms 1  5. Putting on socks/shoes 2  6. Squatting  1  7. Lifting an object, like a bag of groceries from the floor 1  8. Performing light activities around your home 1  9. Performing heavy activities around your home 0  10. Getting into/out of a car 1  11. Walking 2 blocks 0  12. Walking 1 mile 0  13. Going up/down 10 stairs (1 flight) 1  14. Standing for 1 hour 1  15.  sitting for 1 hour 4  16. Running on even ground 0  17. Running on uneven ground 0  18. Making sharp turns  while running fast 0  19. Hopping  0  20. Rolling over in bed 3  Score total:  18     COGNITION: Overall cognitive status: Within functional limits for tasks assessed     SENSATION: WFL  EDEMA:  Circumferential: 49cm diameter (7cm below patella) Rt  44cm Lt same position  MUSCLE LENGTH: Rt hamstring limited by 25% with palpable tension at medial hamstring   POSTURE: posterior pelvic tilt  PALPATION: TTP at Rt medial knee joint line, medial hamstrings, medial fibers of quad, and distal adductor  LOWER EXTREMITY ROM:  Full ROM at knee ext and flexion without pain, Rt hip ER at end range at replicated pain but full range.   LOWER EXTREMITY MMT:  Bil hips grossly 4/5 except Rt hip flexion 3+/5  LOWER EXTREMITY SPECIAL TESTS:  Hip special tests: SI compression test: neg , SI distraction test: negative, Hip scouring test: negative, and Piriformis test: positive    FUNCTIONAL TESTS:  5 times sit to stand: 11s no AD lowest table setting 2 minute walk test: 384' no AD  GAIT: Distance walked: TWO MINUTE WALK TEST *individual walks without assistance for 2 minutes and the distance is measured *start timing when the individual is instructed to "Go" *stop timing at 2 minutes *assistive devices can be used but should be kept consistent and  documented from test to test  *if physical assistance is required to walk, this should not be performed * a measuring wheel is helpful to determine distance walked *should be performed at the fastest speed possible   Gender Age in years (# in study) Mean distance in meters Female  18-54 (260)   200.9 55-59 (23)   191.0 60-64 (29)   179.1 (587 feet) 65-69 (22)   184.2 70-74 (32)   172.2 75-79 (19)   157.6 80-85 (32)   144.1  Female 18-54 (539)   183.0 55-59 (30)   176.4 60-64 (48)   166.4 65-69 (22)   155.2 70-74 (33)   145.9 75-79 (14)   140.9 80-85 (34)   134.3  Assistive device utilized: None Level of assistance: Complete  Independence Comments: trunk flexion, trunk lateral sway for limb advancement, increased step length at Rt with increased pain with attempts to shorten step, decreased cadence  TREATMENT DATE:   12/06/23: Therapeutic Exercises Nu-Step 10 mins total, L5 x 3 mins, then reduced to level 4 due to increased medial knee discomfort, x 9 mins  LE's only; pt wanted to see if she could do 12 mins to work on her endurance Done at end of session: Supine HS strap with x 2 reps, 20-30 sec holds bil, piriformis/inner thigh figure 4 stretch with strap x 2 reps, 20-30 sec holds Therapeutic Activities Standing green band around ankles: hip abduction x 10 reps, hip diagonal extensions x 10 reps, 3 way clocks 2 x 5 right/left , returning therapist demo and VC's for correct LE position and to decrease trunk compensations; pt reports less challenging at knee with Rt LE FWB today Sit to stand  with green band around thighs to activate glutes 2 sets of 10, VC's for full hip ext and glut/core engagement at end motion LAQ 6# 10x 2, then LAQ with er x 5, then x 5 no weight which pt reports challenging but without pain Sidelying clam with added green band 10x right/left , able to return good demo  *pt reports needing to leave session at this time for an appt to get her compression bras needed for after her upcoming breast cancer surgery*   9/18: Nu-Step 7 min L5 Les only (therapist monitoring response and getting status update) Supine knee flexion with stretch out strap 10x Supine HS strap with 3x, with side to side bias 10x Sidelying clam with added green band 10x right/left (verbal and tactile cues for technique)  Sit to stand  with green band around thighs to activate glutes 2 sets of 5  LAQ 6# 10x 2 (feels heavy but not painful) Standing green band around thighs: hip abduction 5x, hip  diagonal extensions 5x, 3 way clocks 5x right/left (challenging to WB on right but not painful) Bil heel raises 10x Retro step/ mini reverse lunge (as if going to kneel) with bil UE support 5x right/left Pt given green band for home use  11/29/23: Therapeutic Exercise Seated HS stretch bil x 2 reps, 20 sec holds NuStep Level 4, LE 7 x 10 mins, 980 steps with PTA monitoring pt response, pt requested longer time as she felt good after last time this doesn't increase p! Runner's lunge at wall x 3 reps, x 30 sec Hip Machine 25#  x 10 for 3 way raises, pain in Rt medial knee with FWB during Lt abd so limited these reps to 8, did well with all others Power Plate for bil HS stretch x 30 sec each SLR x 10, then 1# on ankle x 10 bil, pt reports no pain but increased difficulty, then bil SLR with er for VMO activation x 10 each with tactile cues to limit end ROM End of session: Figure 4 piriformis stretch seated EOB x 3 reps, 20-30 sec holds reviewing proper technique as pt was working to pull ankle closer to groin, but instructed her that keeping knee level should be more of focus with erect posture, she reports feeling better stretch once technique improved Seated EOB with Rt LE extended on mat to her Rt and then forward lean for groin/inner thigh stretch x 3 reps, 10 sec holds, then same on Lt. Pt repots feeling very good stretch with this. Therapeutic Activities  Bridging x 10, then bridging with purple ball squeeze x 10, then bridging with red theraband above knees x 10. No pain with bridges Sit to stand with green theraband above knees x 10,  then squat with band above knees in front of mat x 10 with VC's for full hip ext and glut/core engagement at end motion In // bars: Slow, controlled high knee marching with 3 sec SLS 2 laps, bil sidestepping 1 lap each but increased knee pain so stopped, then front and retro heel-toe walking 2 laps, then same on airex 2 laps each, no increased knee pain with  this     PATIENT EDUCATION:  Education details: [email or web address] Person educated: Patient Education method: Explanation Education comprehension: verbalized understanding, returned demonstration, verbal cues required, tactile cues required, and needs further education  HOME EXERCISE PROGRAM: Access Code: [email or web address] URL: [email or web address] Date: 11/22/2023 Prepared by: Glade Pesa  Exercises - Seated Table Hamstring Stretch  - 1 x daily - 7 x weekly - 1 sets - 3 reps - 30s holds - Supine Piriformis Stretch  - 1 x daily - 7 x weekly - 1 sets - 3 reps - 30s holds - Standing Gastroc Stretch on Step with Counter Support  - 1 x daily - 7 x weekly - 1 sets - 3 reps - 30s hold - Supine Hip Internal and External Rotation  - 1 x daily - 7 x weekly - 1 sets - 10 reps - 5s holds - Clamshell  - 1 x daily - 7 x weekly - 1 sets - 10 reps - Prone Gluteal Sets  - 1 x daily - 7 x weekly - 1 sets - 10 reps - Sit to Stand  - 1 x daily - 7 x weekly - 1 sets - 10 reps - Gastroc Stretch on Wall  - 1 x daily - 7 x weekly - 3 reps - 30 hold  ASSESSMENT:  CLINICAL IMPRESSION: Continued with exercises that were progressed at last session as pt reports these were challenging and without pain. Continued to monitor pt for any increased pain and correct technique prn during session, see above. Pt had to leave early for an appt at 9. Eward Sharps, PT messaged pts surgeon to see how soon she can resume this physical therapy after her upcoming breast cancer surgery. She will update POC accordingly once we receive word back from Dr. Aron.    OBJECTIVE IMPAIRMENTS: Abnormal gait, decreased activity tolerance, decreased balance, decreased coordination, decreased endurance, decreased mobility, difficulty walking, decreased ROM, decreased strength, increased fascial restrictions, impaired perceived functional ability, increased muscle spasms, impaired flexibility, improper body mechanics, postural dysfunction,  and pain.   ACTIVITY LIMITATIONS: carrying, lifting, bending, standing, squatting, stairs, transfers, and locomotion level  PARTICIPATION LIMITATIONS: meal prep, cleaning, laundry, community activity, and yard work  PERSONAL FACTORS: Fitness, Past/current experiences, Time since onset of injury/illness/exacerbation, and 1 comorbidity: medical history are also affecting patient's functional outcome.   REHAB POTENTIAL: Good  CLINICAL DECISION MAKING: Stable/uncomplicated  EVALUATION COMPLEXITY: Low   GOALS: Goals reviewed with patient? Yes  SHORT TERM GOALS: Target date: 12/15/23 Pt to be I with HEP for carry over and continuing recommendations for improved outcomes.   Baseline: Goal status: INITIAL  2.  Pt to demonstrate full ROM of Rt hip without pain for improved gait mechanics to tolerate return to PLOF for walking.  Baseline:  Goal status: INITIAL  3.  Pt to demonstrate x10 sit to stands without pain or compensation for improved hip strength for walking. Baseline:  Goal status: INITIAL  4.  Pt to demonstrate 5/5 bil hip strength without pain for improved ability to transfer from ground to standing to return to gardening Baseline:  Goal status: INITIAL   LONG TERM GOALS: Target date: 01/12/24  Pt to be I with advanced HEP for carry over and continuing recommendations for improved outcomes.   Baseline:  Goal status: INITIAL  2.  Pt to report ability to tolerate walking at least 1 mile with no more than 1/10 pain for improved QOL.  Baseline:  Goal status: INITIAL  3.  Pt to demonstrate improved within age related norms due to improved cadence and mechanics of gait for improved ability to complete grocery errands.  Baseline:  Goal status: INITIAL  4.  Pt to demonstrate improved 5xSTS to no more than 7s for decreased fall risk.  Baseline:  Goal status: INITIAL    PLAN:  PT FREQUENCY: 2x/week  PT DURATION: 8 weeks  PLANNED INTERVENTIONS:  97110-Therapeutic exercises, 97530- Therapeutic activity, [id number]- Neuromuscular re-education, 97535- Self Care, [id number]- Manual therapy, [phone]- Gait training, [phone]- Canalith repositioning, [id number]- Aquatic Therapy, [phone]- Electrical stimulation (manual), [id number]- Vasopneumatic device, [id number]- Traction (mechanical), [id number]- Ionotophoresis 4mg /ml Dexamethasone, 20560 (1-2 muscles), [id number] (3+ muscles)- Dry Needling, Patient/Family education, Taping, Joint mobilization, Spinal mobilization, Scar mobilization, DME instructions, Cryotherapy, Moist heat, and Biofeedback  PLAN FOR NEXT SESSION: Cont gluteal activation ex's; Nu-Step Les only; stretch hamstrings, adductors, hip strengthening, standing balance progressing pt as pain tolerance allows.  Berwyn Knights, PTA 12/06/23 9:03 AM  [facility] [address], [address] [place], [place] [address] Phone # [phone] Fax [phone]

## 2023-12-08 ENCOUNTER — Encounter: Payer: Self-pay | Admitting: General Practice

## 2023-12-08 ENCOUNTER — Ambulatory Visit: Admitting: Physical Therapy

## 2023-12-08 DIAGNOSIS — M79604 Pain in right leg: Secondary | ICD-10-CM

## 2023-12-08 DIAGNOSIS — M6281 Muscle weakness (generalized): Secondary | ICD-10-CM | POA: Diagnosis not present

## 2023-12-08 NOTE — Therapy (Signed)
 OUTPATIENT PHYSICAL THERAPY LOWER EXTREMITY ORTHO TREATMENT   Patient Name: Amanda Herring MRN: [id number] DOB:May 22, 1955, 68 y.o., female Today's Date: 12/08/2023  END OF SESSION:  PT End of Session - 12/08/23 0755     Visit Number 7    Number of Visits 16    Date for Recertification  01/12/24    Authorization Type [employer]    Progress Note Due on Visit 10    PT Start Time 0800    PT Stop Time 0840    PT Time Calculation (min) 40 min    Activity Tolerance Patient tolerated treatment well           Past Medical History:  Diagnosis Date   Family history of bladder cancer    Hepatitis C    Hypertension    Hypothyroidism    Kidney stone    Past Surgical History:  Procedure Laterality Date   BREAST BIOPSY Right 11/04/2023   US  RT BREAST BX W LOC DEV 1ST LESION IMG BX SPEC US  GUIDE 11/04/2023 [facility]MO[facility]   BREAST EXCISIONAL BIOPSY Bilateral    1981/1980   BREAST LUMPECTOMY Left    1998   BREAST SURGERY     augmentation   Patient Active Problem List   Diagnosis Date Noted   Genetic testing 11/28/2023   Family history of bladder cancer    Malignant neoplasm of upper-outer quadrant of right breast in female, estrogen receptor positive (HCC) 11/11/2023    PCP: Dyane Anthony RAMAN, FNP   REFERRING PROVIDER:   Edna Toribio LABOR, MD    REFERRING DIAG: RIGHT PES BURSA/RIGHT HAMSTRING STRAIN   THERAPY DIAG:  Muscle weakness (generalized)  Pain in right leg  Rationale for Evaluation and Treatment: Rehabilitation  ONSET DATE: jan 2025  SUBJECTIVE:   SUBJECTIVE STATEMENT: I'm up to 15 minutes walking, if I do more that will throw it into spasm.  It's feeling pretty good. I stretched pretty good this morning.  With weight bearing on that side to do the clocks it feels weak.  The clam with band really makes my knee feel better. I hope I'm able to continue PT after my surgery.    PERTINENT HISTORY: Malignant neoplasm of upper-outer quadrant  of right breast in female, estrogen receptor positive (HCC)  PAIN:  Are you having pain? Yes: NPRS scale: 3/10,  Pain location: Rt medial knee to inferior to patella Pain description: sharp (when its high), dull achy (constant) Aggravating factors: walking, carrying 45 month old grand daughter, end of the day Relieving factors: rest, propping, ice, advil    PRECAUTIONS: Other: cancer pt  RED FLAGS: None   WEIGHT BEARING RESTRICTIONS: No  FALLS:  Has patient fallen in last 6 months? No  LIVING ENVIRONMENT: Lives with: lives alone Lives in: House/apartment Stairs: No Has following equipment at home: None  OCCUPATION: retired   PLOF: Independent  PATIENT GOALS: to have less pain to go back to walking (was doing 1-2 miles)  NEXT MD VISIT: nothing planned   OBJECTIVE:  Note: Objective measures were completed at Evaluation unless otherwise noted.  DIAGNOSTIC FINDINGS: no imagining for hamstring or knee  PATIENT SURVEYS:  LEFS  Extreme difficulty/unable (0), Quite a bit of difficulty (1), Moderate difficulty (2), Little difficulty (3), No difficulty (4) Survey date:  11/17/23  Any of your usual work, housework or school activities 1  2. Usual hobbies, recreational or sporting activities 0  3. Getting into/out of the bath 1  4. Walking between rooms  1  5. Putting on socks/shoes 2  6. Squatting  1  7. Lifting an object, like a bag of groceries from the floor 1  8. Performing light activities around your home 1  9. Performing heavy activities around your home 0  10. Getting into/out of a car 1  11. Walking 2 blocks 0  12. Walking 1 mile 0  13. Going up/down 10 stairs (1 flight) 1  14. Standing for 1 hour 1  15.  sitting for 1 hour 4  16. Running on even ground 0  17. Running on uneven ground 0  18. Making sharp turns while running fast 0  19. Hopping  0  20. Rolling over in bed 3  Score total:  18     COGNITION: Overall cognitive status: Within functional limits  for tasks assessed     SENSATION: WFL  EDEMA:  Circumferential: 49cm diameter (7cm below patella) Rt  44cm Lt same position  MUSCLE LENGTH: Rt hamstring limited by 25% with palpable tension at medial hamstring   POSTURE: posterior pelvic tilt  PALPATION: TTP at Rt medial knee joint line, medial hamstrings, medial fibers of quad, and distal adductor  LOWER EXTREMITY ROM:  Full ROM at knee ext and flexion without pain, Rt hip ER at end range at replicated pain but full range.   LOWER EXTREMITY MMT:  Bil hips grossly 4/5 except Rt hip flexion 3+/5  LOWER EXTREMITY SPECIAL TESTS:  Hip special tests: SI compression test: neg , SI distraction test: negative, Hip scouring test: negative, and Piriformis test: positive    FUNCTIONAL TESTS:  5 times sit to stand: 11s no AD lowest table setting 2 minute walk test: 384' no AD  GAIT: Distance walked: TWO MINUTE WALK TEST *individual walks without assistance for 2 minutes and the distance is measured *start timing when the individual is instructed to "Go" *stop timing at 2 minutes *assistive devices can be used but should be kept consistent and  documented from test to test  *if physical assistance is required to walk, this should not be performed * a measuring wheel is helpful to determine distance walked *should be performed at the fastest speed possible   Gender Age in years (# in study) Mean distance in meters Female  18-54 (260)   200.9 55-59 (23)   191.0 60-64 (29)   179.1 (587 feet) 65-69 (22)   184.2 70-74 (32)   172.2 75-79 (19)   157.6 80-85 (32)   144.1  Female 18-54 (539)   183.0 55-59 (30)   176.4 60-64 (48)   166.4 65-69 (22)   155.2 70-74 (33)   145.9 75-79 (14)   140.9 80-85 (34)   134.3  Assistive device utilized: None Level of assistance: Complete Independence Comments: trunk flexion, trunk lateral sway for limb advancement, increased step length at Rt with increased pain with attempts to shorten step,  decreased cadence  TREATMENT DATE:   9/25: Nu-Step 10 min L4 Les only (therapist monitoring response and getting status update) 117 spm average Sidelying clam with added green band (shortened band/tighter for added resistance) 10x right/left (verbal and tactile cues for technique)  Sit to stand  with green band around thighs to activate glutes 10x  (much improved technique) LAQ 7# right 10x 2 (feels heavy but not painful) Seated hip adduction with green band 2x10 Standing heel raises 10x, staggered Leg press seat 7 (move to seat 6 next time) 70# 20x, plus 10x more after single legs per pt request; right/left 35# 15x  Hip matrix 40# hip abduction and hip extension 10x right/ 7x left (more difficult with WB on right)  12/06/23: Therapeutic Exercises Nu-Step 10 mins total, L5 x 3 mins, then reduced to level 4 due to increased medial knee discomfort, x 9 mins  LE's only; pt wanted to see if she could do 12 mins to work on her endurance Done at end of session: Supine HS strap with x 2 reps, 20-30 sec holds bil, piriformis/inner thigh figure 4 stretch with strap x 2 reps, 20-30 sec holds Therapeutic Activities Standing green band around ankles: hip abduction x 10 reps, hip diagonal extensions x 10 reps, 3 way clocks 2 x 5 right/left , returning therapist demo and VC's for correct LE position and to decrease trunk compensations; pt reports less challenging at knee with Rt LE FWB today Sit to stand  with green band around thighs to activate glutes 2 sets of 10, VC's for full hip ext and glut/core engagement at end motion LAQ 6# 10x 2, then LAQ with er x 5, then x 5 no weight which pt reports challenging but without pain Sidelying clam with added green band 10x right/left , able to return good demo  *pt reports needing to leave session at this time for an appt to get her  compression bras needed for after her upcoming breast cancer surgery*   9/18: Nu-Step 7 min L5 Les only (therapist monitoring response and getting status update) Supine knee flexion with stretch out strap 10x Supine HS strap with 3x, with side to side bias 10x Sidelying clam with added green band 10x right/left (verbal and tactile cues for technique)  Sit to stand  with green band around thighs to activate glutes 2 sets of 5  LAQ 6# 10x 2 (feels heavy but not painful) Standing green band around thighs: hip abduction 5x, hip diagonal extensions 5x, 3 way clocks 5x right/left (challenging to WB on right but not painful) Bil heel raises 10x Retro step/ mini reverse lunge (as if going to kneel) with bil UE support 5x right/left Pt given green band for home use  11/29/23: Therapeutic Exercise Seated HS stretch bil x 2 reps, 20 sec holds NuStep Level 4, LE 7 x 10 mins, 980 steps with PTA monitoring pt response, pt requested longer time as she felt good after last time this doesn't increase p! Runner's lunge at wall x 3 reps, x 30 sec Hip Machine 25#  x 10 for 3 way raises, pain in Rt medial knee with FWB during Lt abd so limited these reps to 8, did well with all others Power Plate for bil HS stretch x 30 sec each SLR x 10, then 1# on ankle x 10 bil, pt reports no pain but increased difficulty, then bil SLR with er for VMO activation x 10 each with tactile cues to limit end ROM End of session: Figure 4  piriformis stretch seated EOB x 3 reps, 20-30 sec holds reviewing proper technique as pt was working to pull ankle closer to groin, but instructed her that keeping knee level should be more of focus with erect posture, she reports feeling better stretch once technique improved Seated EOB with Rt LE extended on mat to her Rt and then forward lean for groin/inner thigh stretch x 3 reps, 10 sec holds, then same on Lt. Pt repots feeling very good stretch with this. Therapeutic Activities  Bridging x 10,  then bridging with purple ball squeeze x 10, then bridging with red theraband above knees x 10. No pain with bridges Sit to stand with green theraband above knees x 10, then squat with band above knees in front of mat x 10 with VC's for full hip ext and glut/core engagement at end motion In // bars: Slow, controlled high knee marching with 3 sec SLS 2 laps, bil sidestepping 1 lap each but increased knee pain so stopped, then front and retro heel-toe walking 2 laps, then same on airex 2 laps each, no increased knee pain with this     PATIENT EDUCATION:  Education details: [email or web address] Person educated: Patient Education method: Explanation Education comprehension: verbalized understanding, returned demonstration, verbal cues required, tactile cues required, and needs further education  HOME EXERCISE PROGRAM: Access Code: [email or web address] URL: [email or web address] Date: 11/22/2023 Prepared by: Glade Pesa  Exercises - Seated Table Hamstring Stretch  - 1 x daily - 7 x weekly - 1 sets - 3 reps - 30s holds - Supine Piriformis Stretch  - 1 x daily - 7 x weekly - 1 sets - 3 reps - 30s holds - Standing Gastroc Stretch on Step with Counter Support  - 1 x daily - 7 x weekly - 1 sets - 3 reps - 30s hold - Supine Hip Internal and External Rotation  - 1 x daily - 7 x weekly - 1 sets - 10 reps - 5s holds - Clamshell  - 1 x daily - 7 x weekly - 1 sets - 10 reps - Prone Gluteal Sets  - 1 x daily - 7 x weekly - 1 sets - 10 reps - Sit to Stand  - 1 x daily - 7 x weekly - 1 sets - 10 reps - Gastroc Stretch on Wall  - 1 x daily - 7 x weekly - 3 reps - 30 hold  ASSESSMENT:  CLINICAL IMPRESSION: Cleola demonstrates good compliance with her home ex program.  She is able to progress with intensity of ex's including increased resistance, number of reps or higher challenge level of exercises. Verbal and tactile cues for patellofemoral alignment and to decrease compensatory strategies   She demonstrates much  improved gait pattern with much less lateral sway and improved stance time on right.     OBJECTIVE IMPAIRMENTS: Abnormal gait, decreased activity tolerance, decreased balance, decreased coordination, decreased endurance, decreased mobility, difficulty walking, decreased ROM, decreased strength, increased fascial restrictions, impaired perceived functional ability, increased muscle spasms, impaired flexibility, improper body mechanics, postural dysfunction, and pain.   ACTIVITY LIMITATIONS: carrying, lifting, bending, standing, squatting, stairs, transfers, and locomotion level  PARTICIPATION LIMITATIONS: meal prep, cleaning, laundry, community activity, and yard work  PERSONAL FACTORS: Fitness, Past/current experiences, Time since onset of injury/illness/exacerbation, and 1 comorbidity: medical history are also affecting patient's functional outcome.   REHAB POTENTIAL: Good  CLINICAL DECISION MAKING: Stable/uncomplicated  EVALUATION COMPLEXITY: Low   GOALS: Goals reviewed with patient? Yes  SHORT TERM GOALS: Target date:  12/15/23 Pt to be I with HEP for carry over and continuing recommendations for improved outcomes.   Baseline: Goal status: INITIAL  2.  Pt to demonstrate full ROM of Rt hip without pain for improved gait mechanics to tolerate return to PLOF for walking.  Baseline:  Goal status: INITIAL  3.  Pt to demonstrate x10 sit to stands without pain or compensation for improved hip strength for walking. Baseline:  Goal status: INITIAL  4.  Pt to demonstrate 5/5 bil hip strength without pain for improved ability to transfer from ground to standing to return to gardening Baseline:  Goal status: INITIAL   LONG TERM GOALS: Target date: 01/12/24  Pt to be I with advanced HEP for carry over and continuing recommendations for improved outcomes.   Baseline:  Goal status: INITIAL  2.  Pt to report ability to tolerate walking at least 1 mile with no more than 1/10 pain for  improved QOL.  Baseline:  Goal status: INITIAL  3.  Pt to demonstrate improved within age related norms due to improved cadence and mechanics of gait for improved ability to complete grocery errands.  Baseline:  Goal status: INITIAL  4.  Pt to demonstrate improved 5xSTS to no more than 7s for decreased fall risk.  Baseline:  Goal status: INITIAL    PLAN:  PT FREQUENCY: 2x/week  PT DURATION: 8 weeks  PLANNED INTERVENTIONS: 97110-Therapeutic exercises, 97530- Therapeutic activity, [id number]- Neuromuscular re-education, 97535- Self Care, 02859- Manual therapy, [id number]- Gait training, [phone]- Canalith repositioning, [id number]- Aquatic Therapy, [phone]- Electrical stimulation (manual), [id number]- Vasopneumatic device, [id number]- Traction (mechanical), [id number]- Ionotophoresis 4mg /ml Dexamethasone, 20560 (1-2 muscles), 20561 (3+ muscles)- Dry Needling, Patient/Family education, Taping, Joint mobilization, Spinal mobilization, Scar mobilization, DME instructions, Cryotherapy, Moist heat, and Biofeedback  PLAN FOR NEXT SESSION: upcoming surgery; Cont gluteal activation ex's; Nu-Step Les only; stretch hamstrings, adductors, hip strengthening, standing balance progressing pt as pain tolerance allows.   Glade Pesa, PT 12/08/23 10:28 PM Phone: [phone] Fax: [phone]   [facility] [address] [place], [place] [address] Phone # [phone] Fax [phone]

## 2023-12-08 NOTE — Progress Notes (Signed)
 [facility] Spiritual Care Note  Canceled Ms [facility] appointment per her request and also provided emotional support regarding her 68 year old father's illness. Ensured that she is aware of ongoing Spiritual Care availability for support. She plans to phone again to reschedule when able.  [address] [place], [place], [facility] Pager [phone] Voicemail [phone]

## 2023-12-09 ENCOUNTER — Inpatient Hospital Stay: Admitting: General Practice

## 2023-12-09 NOTE — Progress Notes (Signed)
 Surgical Instructions   Your procedure is scheduled on Wednesday, October 1st. Report to [facility] Main Entrance A at 8:30 A.M., then check in with the Admitting office. Any questions or running late day of surgery: call [phone]  Questions prior to your surgery date: call [phone], Monday-Friday, 8am-4pm. If you experience any cold or flu symptoms such as cough, fever, chills, shortness of breath, etc. between now and your scheduled surgery, please notify us  at the above number.     Remember:  Do not eat after midnight the night before your surgery   You may drink clear liquids until 7:30 the morning of your surgery.   Clear liquids allowed are: Water, Non-Citrus Juices (without pulp), Carbonated Beverages, Clear Tea (no milk, honey, etc.), Black Coffee Only (NO MILK, CREAM OR POWDERED CREAMER of any kind), and Gatorade.    Take these medicines the morning of surgery with A SIP OF WATER  cetirizine (ZYRTEC)  levothyroxine (SYNTHROID)    May take these medicines IF NEEDED: famotidine (PEPCID)    One week prior to surgery, STOP taking any Aspirin (unless otherwise instructed by your surgeon) Aleve, Naproxen, Goody's, BC's, all herbal medications and non-prescription vitamins. This includes your ibuprofen (ADVIL,MOTRIN) and Omega-3 Fatty Acids (FISH OIL) .                     Do NOT Smoke (Tobacco/Vaping) for 24 hours prior to your procedure.  If you use a CPAP at night, you may bring your mask/headgear for your overnight stay.   You will be asked to remove any contacts, glasses, piercing's, hearing aid's, dentures/partials prior to surgery. Please bring cases for these items if needed.    Patients discharged the day of surgery will not be allowed to drive home, and someone needs to stay with them for 24 hours.  SURGICAL WAITING ROOM VISITATION Patients may have no more than 2 support people in the waiting area - these visitors may rotate.   Pre-op nurse will  coordinate an appropriate time for 1 ADULT support person, who may not rotate, to accompany patient in pre-op.  Children under the age of 44 must have an adult with them who is not the patient and must remain in the main waiting area with an adult.  If the patient needs to stay at the hospital during part of their recovery, the visitor guidelines for inpatient rooms apply.  Please refer to the [facility] website for the visitor guidelines for any additional information.   If you received a COVID test during your pre-op visit  it is requested that you wear a mask when out in public, stay away from anyone that may not be feeling well and notify your surgeon if you develop symptoms. If you have been in contact with anyone that has tested positive in the last 10 days please notify you surgeon.      Pre-operative CHG Bathing Instructions   You can play a key role in reducing the risk of infection after surgery. Your skin needs to be as free of germs as possible. You can reduce the number of germs on your skin by washing with CHG (chlorhexidine gluconate) soap before surgery. CHG is an antiseptic soap that kills germs and continues to kill germs even after washing.   DO NOT use if you have an allergy to chlorhexidine/CHG or antibacterial soaps. If your skin becomes reddened or irritated, stop using the CHG and notify one of our RNs at [phone].  TAKE A SHOWER THE NIGHT BEFORE SURGERY AND THE DAY OF SURGERY    Please keep in mind the following:  DO NOT shave, including legs and underarms, 48 hours prior to surgery.   You may shave your face before/day of surgery.  Place clean sheets on your bed the night before surgery Use a clean washcloth (not used since being washed) for each shower. DO NOT sleep with pet's night before surgery.  CHG Shower Instructions:  Wash your face and private area with normal soap. If you choose to wash your hair, wash first with your normal shampoo.   After you use shampoo/soap, rinse your hair and body thoroughly to remove shampoo/soap residue.  Turn the water OFF and apply half the bottle of CHG soap to a CLEAN washcloth.  Apply CHG soap ONLY FROM YOUR NECK DOWN TO YOUR TOES (washing for 3-5 minutes)  DO NOT use CHG soap on face, private areas, open wounds, or sores.  Pay special attention to the area where your surgery is being performed.  If you are having back surgery, having someone wash your back for you may be helpful. Wait 2 minutes after CHG soap is applied, then you may rinse off the CHG soap.  Pat dry with a clean towel  Put on clean pajamas    Additional instructions for the day of surgery: DO NOT APPLY any lotions, deodorants, cologne, or perfumes.   Do not wear jewelry or makeup Do not wear nail polish, gel polish, artificial nails, or any other type of covering on natural nails (fingers and toes) Do not bring valuables to the hospital. [facility] is not responsible for valuables/personal belongings. Put on clean/comfortable clothes.  Please brush your teeth.  Ask your nurse before applying any prescription medications to the skin.

## 2023-12-12 ENCOUNTER — Other Ambulatory Visit: Payer: Self-pay

## 2023-12-12 ENCOUNTER — Encounter (HOSPITAL_COMMUNITY)
Admission: RE | Admit: 2023-12-12 | Discharge: 2023-12-12 | Disposition: A | Discharge status: Home / Self Care | Source: Ambulatory Visit | Attending: General Surgery | Admitting: General Surgery

## 2023-12-12 ENCOUNTER — Encounter (HOSPITAL_COMMUNITY): Payer: Self-pay

## 2023-12-12 VITALS — BP 136/77 | HR 57 | Temp 97.9°F | Resp 17 | Ht 64.0 in | Wt 221.9 lb

## 2023-12-12 DIAGNOSIS — Z01810 Encounter for preprocedural cardiovascular examination: Secondary | ICD-10-CM | POA: Insufficient documentation

## 2023-12-12 DIAGNOSIS — Z01818 Encounter for other preprocedural examination: Secondary | ICD-10-CM

## 2023-12-12 HISTORY — DX: Nausea with vomiting, unspecified: R11.2

## 2023-12-12 HISTORY — DX: Family history of other specified conditions: Z84.89

## 2023-12-12 HISTORY — DX: Malignant (primary) neoplasm, unspecified: C80.1

## 2023-12-12 NOTE — H&P (Signed)
 REFERRING PHYSICIAN:  Arceo     PROVIDER:  JINA CLAIR NEPHEW, MD   Care Team: Patient Care Team: Dyane Dries, NP as PCP - General (Internal Medicine) NEPHEW JINA CLAIR, MD as Consulting Provider (Surgical Oncology) Dewey Norleen Hamilton, MD as Referring Physician (Radiation Oncology) Iruku, Linnette Lane Dice, MD as Referring Physician (Hematology and Oncology)    MRN: [id number] DOB: 08-13-1955 DATE OF ENCOUNTER: 11/16/2023   Subjective    Chief Complaint: Breast Cancer   History of Present Illness: Amanda Herring is a 68 y.o. female who is seen today as an office consultation at the request of Dr. Loretha for evaluation of Breast Cancer .     Patient is a 68 year old female with a new diagnosis of right breast cancer August 2025.  Patient presented with a palpable right breast mass.  She had a previously palpable right breast cyst that had been there for a while.  However, she noted that it had become much more firm instead of  squishy.  She underwent diagnostic imaging which showed a 1.9 cm mass in the retroareolar region.  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  Patient has breast density B.  Axilla was negative.   Patient had prior benign breast masses removed from the right.  She had left breast in situ cancer in 1998.  She also had bilateral breast implants placed by Dr. Emery.     Family cancer history -only skin cancers have run in her family. Menarche - 75 Menopause -age 14 Parity - 23, first age 56   Work -retired, but takes care of her 85-month-old granddaughter. Up-to-date with colonoscopy, last around 2023.   Diagnostic mammogram/us  11/04/23 [facility] ACR Breast Density Category b: There are scattered areas of fibroglandular density.  FINDINGS: There is a partly obscured oval mass in the retroareolar right breast corresponding to the palpable lump, measuring  approximately 1.2 cm in size. There is a small stable mass in the lateral retroareolar left breast. There no other defined masses, no areas of architectural distortion and no suspicious calcifications. The patient has retropectoral saline implants.  On physical exam, there is a firm superficial palpable mass in the 12 o'clock position of the right breast.  Targeted ultrasound is performed, showing a cystic and solid mass, predominantly solid, hypoechoic and superficial in the 12 o'clock position of the retroareolar right breast, corresponding to the lump. This measures 1.9 x 1.4 x 1.7 cm. There is internal blood flow within the solid components on color Doppler analysis. Margins are mostly ill-defined.  Sonographic imaging of the right axilla demonstrates normal lymph nodes. No enlarged or abnormal nodes.  IMPRESSION: 1. Suspicious 1.9 cm solid and cystic mass in the 12 o'clock retroareolar right breast. Tissue sampling is indicated.  RECOMMENDATION: 1. Ultrasound-guided core needle biopsy of the cystic and solid mass in the 12 o'clock retroareolar right breast. This procedure was scheduled prior to the [facility].  I have discussed the findings and recommendations with the patient. If applicable, a reminder letter will be sent to the patient regarding the next appointment.  BI-RADS CATEGORY  4: Suspicious.     Pathology core needle biopsy: 11/04/23 1. Breast, right, needle core biopsy, 12 o'clock :       INVASIVE MAMMARY CARCINOMA, SEE NOTE       TUBULE FORMATION: SCORE 3       NUCLEAR PLEOMORPHISM: SCORE 2  MITOTIC COUNT: SCORE 1       TOTAL SCORE: 6       OVERALL GRADE: 2       LYMPHOVASCULAR INVASION: NOT IDENTIFIED       CANCER LENGTH: 1.1 CM       CALCIFICATIONS: NOT IDENTIFIED       OTHER FINDINGS: NONE    Receptors: Estrogen Receptor:  100%, POSITIVE, STRONG STAINING INTENSITY  Progesterone Receptor:  10%, POSITIVE, WEAK STAINING  INTENSITY  Proliferation Marker Ki67:  1%  GROUP 5:   HER2 **NEGATIVE**      Review of Systems: A complete review of systems was obtained from the patient.  I have reviewed this information and discussed as appropriate with the patient.  See HPI as well for other ROS. ROS -otherwise negative.       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   Gastric diverticulum     History of cancer     Thyroid  disease          Problem List     Patient Active Problem List  Diagnosis   Chronic hepatitis C without mention of hepatic coma (CMS/HHS-HCC)   Malignant neoplasm of upper-outer quadrant of right breast in female, estrogen receptor positive (CMS/HHS-HCC)   H/O bilateral breast implants        Past Surgical History       Past Surgical History:  Procedure Laterality Date   TAH-BSO   02/13/2007   LIVER BIOPSY   06/13/2008    Stage 2   breast lumpectomy       HYSTERECTOMY            Allergies       Allergies  Allergen Reactions   Codeine Vomiting and Other (See Comments)      Other Reaction: GI Upset        Medications Ordered Prior to Encounter        Current Outpatient Medications on File Prior to Visit  Medication Sig Dispense Refill   ANTIOX #11/OM3/DHA/EPA/LUT/ZEA (OCUVITE ADULT 50+ ORAL) Take 1 tablet by mouth daily.       b complex vitamins capsule Take 1 capsule by mouth once daily       CALCIUM ORAL Take 1 tablet by mouth daily.       cetirizine (ZYRTEC) 10 mg capsule 1 tab by mouth daily       cholecalciferol (VITAMIN D3) 2,000 unit capsule 1 tab by mouth daily       estradiol (VAGIFEM) 10 mcg vaginal tablet Place 10 mcg vaginally twice a week.       hydroCHLOROthiazide (HYDRODIURIL) 25 MG tablet TAKE 1/2 TABLET BY MOUTH DAILY IN THE MORNING       ibuprofen (ADVIL,MOTRIN) 200 MG tablet 2 tab by mouth every 4 hours as needed        levothyroxine (SYNTHROID) 25 MCG tablet Take 25 mcg by mouth       multivitamin capsule 1 cap by mouth daily       ranitidine (ZANTAC) 150 MG tablet 1 tab by mouth 2 times a day as needed       zolpidem (AMBIEN) 10 mg tablet 4 Times a week       BIOTIN ORAL Take 1 tablet by mouth daily.       citalopram (CELEXA)  20 MG tablet 1/2 tab once daily  until instructed to increase to whole tablet.   6   FERROUS FUMARATE/VIT BCOMP&C (SUPER B COMPLEX ORAL) Take 1 tablet by mouth daily.       GLUCOSAMINE HCL/CHONDR SU A NA (OSTEO BI-FLEX ORAL) Take 1 tablet by mouth daily.        No current facility-administered medications on file prior to visit.        Family History       Family History  Problem Relation Age of Onset   Skin cancer Father     Stroke Father     High blood pressure (Hypertension) Father     Myocardial Infarction (Heart attack) Father     Myocardial Infarction (Heart attack) Brother     High blood pressure (Hypertension) Brother     Skin cancer Brother          Tobacco Use History  Social History       Tobacco Use  Smoking Status Never  Smokeless Tobacco Never        Social History  Social History        Socioeconomic History   Marital status: Single  Tobacco Use   Smoking status: Never   Smokeless tobacco: Never        Objective:         Vitals:    11/16/23 1119  BP: 126/72  Pulse: 68  Resp: 17  Temp: 36.3 C (97.3 F)  Weight: (!) 102.8 kg (226 lb 11.2 oz)  Height: 162.6 cm (5' 4)    Body mass index is 38.91 kg/m.   Gen:  No acute distress.  Well nourished and well groomed.   Neurological: Alert and oriented to person, place, and time. Coordination normal.  Head: Normocephalic and atraumatic.  Eyes: Conjunctivae are normal. Pupils are equal, round, and reactive to light. No scleral icterus.  Neck: Normal range of motion. Neck supple. No tracheal deviation or thyromegaly present.  Cardiovascular: Normal rate, regular rhythm, normal  heart sounds and intact distal pulses.  Exam reveals no gallop and no friction rub.  No murmur heard. Breast: Breasts are relatively symmetric.  No seroma around implants appreciated clinically.  1 to 1-1/2 cm mass at the superior aspect of the areola on the right.  This is separate from the skin.  It is mobile.  It is firm.  There is no nipple retraction or contour defect.  There is no nipple discharge.  There is some bruising at the biopsy site.  No lymphadenopathy is present.  There is no other palpable masses.  The breast tissue overlying the implants is heterogeneously dense.  Left breast is benign. Respiratory: Effort normal.  No respiratory distress. No chest wall tenderness. Breath sounds normal.  No wheezes, rales or rhonchi.  GI: Soft. Bowel sounds are normal. The abdomen is soft and nontender.  There is no rebound and no guarding.  Musculoskeletal: Normal range of motion. Extremities are nontender.  Lymphadenopathy: No cervical, preauricular, postauricular or axillary adenopathy is present Skin: Skin is warm and dry. No rash noted. No diaphoresis. No erythema. No pallor. No clubbing, cyanosis, or edema.   Psychiatric: Normal mood and affect. Behavior is normal. Judgment and thought content normal.      Labs pending   Assessment and Plan:        ICD-10-CM    1. Malignant neoplasm of upper-outer quadrant of right breast in female, estrogen receptor positive (CMS/HHS-HCC)  C50.411  Z17.0       2. H/O bilateral breast implants  Z98.82         Patient has a new diagnosis of clinical T1c N0 hormone positive right breast cancer.  Because this is lobular, we will obtain an MRI to assess for any occult breast cancer or any evidence of a larger than appreciated breast cancer.  Should this be all that we see, we will proceed with a lumpectomy and sentinel lymph node biopsy.  Because this is easily palpable, I do not think we need to place a seed.  Will then proceed with treatment.  This  will be followed by oncotype, radiation, and antihormonal treatment.   I reviewed surgery with the patient and her daughter.  We discussed postoperative restrictions.   The surgical procedure was described to the patient.  I discussed the incision type and location.    We discussed the risks bleeding, infection, damage to other structures, need for further procedures/surgeries.  We discussed the risk of seroma.  The patient was advised if the breast has cancer, we may need to go back to surgery for additional tissue to obtain negative margins or for a lymph node biopsy. The patient was advised that these are the most common complications, but that others can occur as well. I discussed the risk of alteration in breast contour or size.  I discussed risk of chronic pain.  There are rare instances of heart/lung issues post op as well as blood clots.      They were advised against taking aspirin or other anti-inflammatory agents/blood thinners the week before surgery.     The risks and benefits of the procedure were described to the patient and she wishes to proceed.

## 2023-12-12 NOTE — Progress Notes (Signed)
 PCP - [facility]  Cardiologist -  Hematology and Oncology Loretha Ash, MD   PPM/ICD - denies Device Orders - n/a Rep Notified - n/a  Chest x-ray - denies EKG - 12-12-23 Stress Test - denies ECHO - denies Cardiac Cath - denies  Sleep Study - denies CPAP - n/a  DM -denies  Blood Thinner Instructions:denies Aspirin Instructions:denies  ERAS Protcol - Clear liquids until 7:30    COVID TEST- n/a   Anesthesia review: yes Htn, review EKG  Patient denies shortness of breath, fever, cough and chest pain at PAT appointment   All instructions explained to the patient, with a verbal understanding of the material. Patient agrees to go over the instructions while at home for a better understanding. Patient also instructed to self quarantine after being tested for COVID-19. The opportunity to ask questions was provided.

## 2023-12-13 ENCOUNTER — Encounter (HOSPITAL_COMMUNITY): Payer: Self-pay

## 2023-12-13 ENCOUNTER — Ambulatory Visit: Admitting: Physical Therapy

## 2023-12-13 DIAGNOSIS — M6281 Muscle weakness (generalized): Secondary | ICD-10-CM

## 2023-12-13 DIAGNOSIS — M79604 Pain in right leg: Secondary | ICD-10-CM

## 2023-12-13 NOTE — Therapy (Signed)
 OUTPATIENT PHYSICAL THERAPY LOWER EXTREMITY ORTHO TREATMENT   Patient Name: Amanda Herring MRN: [id number] DOB:1955-11-30, 68 y.o., female Today's Date: 12/13/2023  END OF SESSION:  PT End of Session - 12/13/23 0759     Visit Number 8    Number of Visits 16    Date for Recertification  01/12/24    Authorization Type [employer]    Progress Note Due on Visit 10    PT Start Time 0800    PT Stop Time 0840    PT Time Calculation (min) 40 min    Activity Tolerance Patient tolerated treatment well           Past Medical History:  Diagnosis Date   Cancer (HCC)    Family history of adverse reaction to anesthesia    per patient her dad had post n/v   Family history of bladder cancer    Hepatitis C    Hypertension    Hypothyroidism    Kidney stone    PONV (postoperative nausea and vomiting)    Past Surgical History:  Procedure Laterality Date   ABDOMINAL HYSTERECTOMY     BREAST BIOPSY Right 11/04/2023   US  RT BREAST BX W LOC DEV 1ST LESION IMG BX SPEC US  GUIDE 11/04/2023 [facility]   BREAST EXCISIONAL BIOPSY Bilateral    1981/1980   BREAST LUMPECTOMY Left    1998   BREAST SURGERY     augmentation   Patient Active Problem List   Diagnosis Date Noted   Genetic testing 11/28/2023   Family history of bladder cancer    Malignant neoplasm of upper-outer quadrant of right breast in female, estrogen receptor positive (HCC) 11/11/2023    PCP: Dyane Anthony RAMAN, FNP   REFERRING PROVIDER:   Edna Toribio LABOR, MD    REFERRING DIAG: RIGHT PES BURSA/RIGHT HAMSTRING STRAIN   THERAPY DIAG:  Muscle weakness (generalized)  Pain in right leg  Rationale for Evaluation and Treatment: Rehabilitation  ONSET DATE: jan 2025  SUBJECTIVE:   SUBJECTIVE STATEMENT: I walked a mile and I've been paying for it.  That (medial) knee has been bothering me.  There's fluid in that spot.  Did fine with the leg press last time.  Stretching/movement helps it feel  better.     PERTINENT HISTORY: Malignant neoplasm of upper-outer quadrant of right breast in female, estrogen receptor positive (HCC)  PAIN:  Are you having pain? Yes: NPRS scale: 5/10  Pain location: Rt medial knee to inferior to patella Pain description: sharp (when its high), dull achy (constant) Aggravating factors: walking, carrying 62 month old grand daughter, end of the day Relieving factors: rest, propping, ice, advil    PRECAUTIONS: Other: cancer pt  RED FLAGS: None   WEIGHT BEARING RESTRICTIONS: No  FALLS:  Has patient fallen in last 6 months? No  LIVING ENVIRONMENT: Lives with: lives alone Lives in: House/apartment Stairs: No Has following equipment at home: None  OCCUPATION: retired   PLOF: Independent  PATIENT GOALS: to have less pain to go back to walking (was doing 1-2 miles)  NEXT MD VISIT: nothing planned   OBJECTIVE:  Note: Objective measures were completed at Evaluation unless otherwise noted.  DIAGNOSTIC FINDINGS: no imagining for hamstring or knee  PATIENT SURVEYS:  LEFS  Extreme difficulty/unable (0), Quite a bit of difficulty (1), Moderate difficulty (2), Little difficulty (3), No difficulty (4) Survey date:  11/17/23  Any of your usual work, housework or school activities 1  2. Usual hobbies, recreational or  sporting activities 0  3. Getting into/out of the bath 1  4. Walking between rooms 1  5. Putting on socks/shoes 2  6. Squatting  1  7. Lifting an object, like a bag of groceries from the floor 1  8. Performing light activities around your home 1  9. Performing heavy activities around your home 0  10. Getting into/out of a car 1  11. Walking 2 blocks 0  12. Walking 1 mile 0  13. Going up/down 10 stairs (1 flight) 1  14. Standing for 1 hour 1  15.  sitting for 1 hour 4  16. Running on even ground 0  17. Running on uneven ground 0  18. Making sharp turns while running fast 0  19. Hopping  0  20. Rolling over in bed 3  Score  total:  18     COGNITION: Overall cognitive status: Within functional limits for tasks assessed     SENSATION: WFL  EDEMA:  Circumferential: 49cm diameter (7cm below patella) Rt  44cm Lt same position  MUSCLE LENGTH: Rt hamstring limited by 25% with palpable tension at medial hamstring   POSTURE: posterior pelvic tilt  PALPATION: TTP at Rt medial knee joint line, medial hamstrings, medial fibers of quad, and distal adductor  LOWER EXTREMITY ROM:  Full ROM at knee ext and flexion without pain, Rt hip ER at end range at replicated pain but full range.   LOWER EXTREMITY MMT:  Bil hips grossly 4/5 except Rt hip flexion 3+/5  LOWER EXTREMITY SPECIAL TESTS:  Hip special tests: SI compression test: neg , SI distraction test: negative, Hip scouring test: negative, and Piriformis test: positive    FUNCTIONAL TESTS:  5 times sit to stand: 11s no AD lowest table setting 2 minute walk test: 384' no AD  GAIT: Distance walked: TWO MINUTE WALK TEST *individual walks without assistance for 2 minutes and the distance is measured *start timing when the individual is instructed to "Go" *stop timing at 2 minutes *assistive devices can be used but should be kept consistent and  documented from test to test  *if physical assistance is required to walk, this should not be performed * a measuring wheel is helpful to determine distance walked *should be performed at the fastest speed possible   Gender Age in years (# in study) Mean distance in meters Female  18-54 (260)   200.9 55-59 (23)   191.0 60-64 (29)   179.1 (587 feet) 65-69 (22)   184.2 70-74 (32)   172.2 75-79 (19)   157.6 80-85 (32)   144.1  Female 18-54 (539)   183.0 55-59 (30)   176.4 60-64 (48)   166.4 65-69 (22)   155.2 70-74 (33)   145.9 75-79 (14)   140.9 80-85 (34)   134.3  Assistive device utilized: None Level of assistance: Complete Independence Comments: trunk flexion, trunk lateral sway for limb  advancement, increased step length at Rt with increased pain with attempts to shorten step, decreased cadence  TREATMENT DATE:   9/30: Discussed using a cane for walking longer distances: cane on left to offset right  Discussion use of floor bike for knee ROM  Nu-Step 10 min L4 Les only (therapist monitoring response and getting status update)  LAQ 7# right 30x (increase to 8#) Standing heel raises 10x left hand on wall  Modified single leg dead lift 4# to knee level (pulls a lot)  Leg press seat 6 next time) 75# 20 x2; right/left 35# 15x  Hip matrix 40# hip abduction and hip extension 15x right/ 5x left (more difficult with WB on right) SLR 2# 10x  Sidelying 2# hip abduction 10x Sidelying 2# hip adduction 10x Pain 2/10  9/25: Nu-Step 10 min L4 Les only (therapist monitoring response and getting status update) 117 spm average Sidelying clam with added green band (shortened band/tighter for added resistance) 10x right/left (verbal and tactile cues for technique)  Sit to stand  with green band around thighs to activate glutes 10x  (much improved technique) LAQ 7# right 10x 2 (feels heavy but not painful) Seated hip adduction with green band 2x10 Standing heel raises 10x, staggered Leg press seat 7 (move to seat 6 next time) 70# 20x, plus 10x more after single legs per pt request; right/left 35# 15x  Hip matrix 40# hip abduction and hip extension 10x right/ 7x left (more difficult with WB on right)  12/06/23: Therapeutic Exercises Nu-Step 10 mins total, L5 x 3 mins, then reduced to level 4 due to increased medial knee discomfort, x 9 mins  LE's only; pt wanted to see if she could do 12 mins to work on her endurance Done at end of session: Supine HS strap with x 2 reps, 20-30 sec holds bil, piriformis/inner thigh figure 4 stretch with strap x 2 reps, 20-30 sec  holds Therapeutic Activities Standing green band around ankles: hip abduction x 10 reps, hip diagonal extensions x 10 reps, 3 way clocks 2 x 5 right/left , returning therapist demo and VC's for correct LE position and to decrease trunk compensations; pt reports less challenging at knee with Rt LE FWB today Sit to stand  with green band around thighs to activate glutes 2 sets of 10, VC's for full hip ext and glut/core engagement at end motion LAQ 6# 10x 2, then LAQ with er x 5, then x 5 no weight which pt reports challenging but without pain Sidelying clam with added green band 10x right/left , able to return good demo  *pt reports needing to leave session at this time for an appt to get her compression bras needed for after her upcoming breast cancer surgery*   9/18: Nu-Step 7 min L5 Les only (therapist monitoring response and getting status update) Supine knee flexion with stretch out strap 10x Supine HS strap with 3x, with side to side bias 10x Sidelying clam with added green band 10x right/left (verbal and tactile cues for technique)  Sit to stand  with green band around thighs to activate glutes 2 sets of 5  LAQ 6# 10x 2 (feels heavy but not painful) Standing green band around thighs: hip abduction 5x, hip diagonal extensions 5x, 3 way clocks 5x right/left (challenging to WB on right but not painful) Bil heel raises 10x Retro step/ mini reverse lunge (as if going to kneel) with bil UE support 5x right/left Pt given green band for home use  11/29/23: Therapeutic Exercise Seated HS stretch bil x 2 reps, 20 sec holds NuStep Level 4, LE 7  x 10 mins, 980 steps with PTA monitoring pt response, pt requested longer time as she felt good after last time this doesn't increase p! Runner's lunge at wall x 3 reps, x 30 sec Hip Machine 25#  x 10 for 3 way raises, pain in Rt medial knee with FWB during Lt abd so limited these reps to 8, did well with all others Power Plate for bil HS stretch x 30 sec  each SLR x 10, then 1# on ankle x 10 bil, pt reports no pain but increased difficulty, then bil SLR with er for VMO activation x 10 each with tactile cues to limit end ROM End of session: Figure 4 piriformis stretch seated EOB x 3 reps, 20-30 sec holds reviewing proper technique as pt was working to pull ankle closer to groin, but instructed her that keeping knee level should be more of focus with erect posture, she reports feeling better stretch once technique improved Seated EOB with Rt LE extended on mat to her Rt and then forward lean for groin/inner thigh stretch x 3 reps, 10 sec holds, then same on Lt. Pt repots feeling very good stretch with this. Therapeutic Activities  Bridging x 10, then bridging with purple ball squeeze x 10, then bridging with red theraband above knees x 10. No pain with bridges Sit to stand with green theraband above knees x 10, then squat with band above knees in front of mat x 10 with VC's for full hip ext and glut/core engagement at end motion In // bars: Slow, controlled high knee marching with 3 sec SLS 2 laps, bil sidestepping 1 lap each but increased knee pain so stopped, then front and retro heel-toe walking 2 laps, then same on airex 2 laps each, no increased knee pain with this     PATIENT EDUCATION:  Education details: [email or web address] Person educated: Patient Education method: Explanation Education comprehension: verbalized understanding, returned demonstration, verbal cues required, tactile cues required, and needs further education  HOME EXERCISE PROGRAM: Access Code: [email or web address] URL: [email or web address] Date: 11/22/2023 Prepared by: Glade Pesa  Exercises - Seated Table Hamstring Stretch  - 1 x daily - 7 x weekly - 1 sets - 3 reps - 30s holds - Supine Piriformis Stretch  - 1 x daily - 7 x weekly - 1 sets - 3 reps - 30s holds - Standing Gastroc Stretch on Step with Counter Support  - 1 x daily - 7 x weekly - 1 sets - 3 reps - 30s hold -  Supine Hip Internal and External Rotation  - 1 x daily - 7 x weekly - 1 sets - 10 reps - 5s holds - Clamshell  - 1 x daily - 7 x weekly - 1 sets - 10 reps - Prone Gluteal Sets  - 1 x daily - 7 x weekly - 1 sets - 10 reps - Sit to Stand  - 1 x daily - 7 x weekly - 1 sets - 10 reps - Gastroc Stretch on Wall  - 1 x daily - 7 x weekly - 3 reps - 30 hold  ASSESSMENT:  CLINICAL IMPRESSION: The patient reports increased medial knee pain after walking 1 mile a couple of days ago.  Gait more antalgic today with more lateral sway.  She responds well to open chain ex's which provide pain relief.  Much improved awareness of patellofemoral alignment.  Good progress with rehab goals. Will follow up after breast surgery with continued rehab promoting right knee ROM and LE strengthening.  OBJECTIVE IMPAIRMENTS: Abnormal gait, decreased activity tolerance, decreased balance, decreased coordination, decreased endurance, decreased mobility, difficulty walking, decreased ROM, decreased strength, increased fascial restrictions, impaired perceived functional ability, increased muscle spasms, impaired flexibility, improper body mechanics, postural dysfunction, and pain.   ACTIVITY LIMITATIONS: carrying, lifting, bending, standing, squatting, stairs, transfers, and locomotion level  PARTICIPATION LIMITATIONS: meal prep, cleaning, laundry, community activity, and yard work  PERSONAL FACTORS: Fitness, Past/current experiences, Time since onset of injury/illness/exacerbation, and 1 comorbidity: medical history are also affecting patient's functional outcome.   REHAB POTENTIAL: Good  CLINICAL DECISION MAKING: Stable/uncomplicated  EVALUATION COMPLEXITY: Low   GOALS: Goals reviewed with patient? Yes  SHORT TERM GOALS: Target date: 12/15/23 Pt to be I with HEP for carry over and continuing recommendations for improved outcomes.   Baseline: Goal status: met 9/30  2.  Pt to demonstrate full ROM of Rt hip without  pain for improved gait mechanics to tolerate return to PLOF for walking.  Baseline:  Goal status: partially met   3.  Pt to demonstrate x10 sit to stands without pain or compensation for improved hip strength for walking. Baseline:  Goal status: met 9/30  4.  Pt to demonstrate 5/5 bil hip strength without pain for improved ability to transfer from ground to standing to return to gardening Baseline:  Goal status: partially met, knee pain affects transfers   LONG TERM GOALS: Target date: 01/12/24  Pt to be I with advanced HEP for carry over and continuing recommendations for improved outcomes.   Baseline:  Goal status: INITIAL  2.  Pt to report ability to tolerate walking at least 1 mile with no more than 1/10 pain for improved QOL.  Baseline:  Goal status: INITIAL  3.  Pt to demonstrate improved within age related norms due to improved cadence and mechanics of gait for improved ability to complete grocery errands.  Baseline:  Goal status: INITIAL  4.  Pt to demonstrate improved 5xSTS to no more than 7s for decreased fall risk.  Baseline:  Goal status: INITIAL    PLAN:  PT FREQUENCY: 2x/week  PT DURATION: 8 weeks  PLANNED INTERVENTIONS: 97110-Therapeutic exercises, 97530- Therapeutic activity, [id number]- Neuromuscular re-education, 97535- Self Care, 02859- Manual therapy, [id number]- Gait training, [id number]- Canalith repositioning, [id number]- Aquatic Therapy, [phone]- Electrical stimulation (manual), [id number]- Vasopneumatic device, [id number]- Traction (mechanical), [id number]- Ionotophoresis 4mg /ml Dexamethasone, 79439 (1-2 muscles), [id number] (3+ muscles)- Dry Needling, Patient/Family education, Taping, Joint mobilization, Spinal mobilization, Scar mobilization, DME instructions, Cryotherapy, Moist heat, and Biofeedback  PLAN FOR NEXT SESSION: upcoming surgery; Cont gluteal activation ex's; Nu-Step Les only; stretch hamstrings, adductors, hip strengthening, standing balance progressing pt as pain  tolerance allows.   Glade Pesa, PT 12/13/23 9:22 PM Phone: [phone] Fax: [phone]   [facility] [address] [place], [place] [address] Phone # [phone] Fax [phone]

## 2023-12-14 ENCOUNTER — Encounter (HOSPITAL_COMMUNITY)
Admission: RE | Disposition: A | Discharge status: Home / Self Care | Payer: Self-pay | Source: Home / Self Care | Attending: General Surgery

## 2023-12-14 ENCOUNTER — Ambulatory Visit (HOSPITAL_COMMUNITY): Admitting: Anesthesiology

## 2023-12-14 ENCOUNTER — Ambulatory Visit (HOSPITAL_COMMUNITY): Payer: Self-pay | Admitting: Physician Assistant

## 2023-12-14 ENCOUNTER — Encounter (HOSPITAL_COMMUNITY): Payer: Self-pay | Admitting: General Surgery

## 2023-12-14 ENCOUNTER — Ambulatory Visit (HOSPITAL_COMMUNITY)
Admission: RE | Admit: 2023-12-14 | Discharge: 2023-12-14 | Disposition: A | Discharge status: Home / Self Care | Attending: General Surgery | Admitting: General Surgery

## 2023-12-14 ENCOUNTER — Other Ambulatory Visit: Payer: Self-pay

## 2023-12-14 DIAGNOSIS — Z853 Personal history of malignant neoplasm of breast: Secondary | ICD-10-CM | POA: Diagnosis not present

## 2023-12-14 DIAGNOSIS — B182 Chronic viral hepatitis C: Secondary | ICD-10-CM | POA: Diagnosis not present

## 2023-12-14 DIAGNOSIS — I10 Essential (primary) hypertension: Secondary | ICD-10-CM | POA: Insufficient documentation

## 2023-12-14 DIAGNOSIS — Z9882 Breast implant status: Secondary | ICD-10-CM | POA: Diagnosis not present

## 2023-12-14 DIAGNOSIS — Z1721 Progesterone receptor positive status: Secondary | ICD-10-CM | POA: Diagnosis not present

## 2023-12-14 DIAGNOSIS — C50411 Malignant neoplasm of upper-outer quadrant of right female breast: Secondary | ICD-10-CM | POA: Insufficient documentation

## 2023-12-14 DIAGNOSIS — Z79899 Other long term (current) drug therapy: Secondary | ICD-10-CM | POA: Insufficient documentation

## 2023-12-14 DIAGNOSIS — Z170 Estrogen receptor positive status [ER+]: Secondary | ICD-10-CM | POA: Diagnosis not present

## 2023-12-14 DIAGNOSIS — Z1732 Human epidermal growth factor receptor 2 negative status: Secondary | ICD-10-CM | POA: Insufficient documentation

## 2023-12-14 DIAGNOSIS — E039 Hypothyroidism, unspecified: Secondary | ICD-10-CM | POA: Insufficient documentation

## 2023-12-14 DIAGNOSIS — C50911 Malignant neoplasm of unspecified site of right female breast: Secondary | ICD-10-CM

## 2023-12-14 HISTORY — PX: SENTINEL NODE BIOPSY: SHX6608

## 2023-12-14 HISTORY — PX: BREAST LUMPECTOMY: SHX2

## 2023-12-14 SURGERY — BREAST LUMPECTOMY
Anesthesia: General | Site: Breast | Laterality: Right

## 2023-12-14 MED ORDER — CHLORHEXIDINE GLUCONATE CLOTH 2 % EX PADS
6.0000 | MEDICATED_PAD | Freq: Once | CUTANEOUS | Status: DC
Start: 2023-12-14 — End: 2023-12-14

## 2023-12-14 MED ORDER — FENTANYL CITRATE (PF) 100 MCG/2ML IJ SOLN
INTRAMUSCULAR | Status: AC
  Administered 2023-12-14: 50 ug via INTRAVENOUS
  Filled 2023-12-14: qty 2

## 2023-12-14 MED ORDER — FENTANYL CITRATE (PF) 100 MCG/2ML IJ SOLN: 50.0000 ug | Freq: Once | INTRAMUSCULAR | Status: AC

## 2023-12-14 MED ORDER — 0.9 % SODIUM CHLORIDE (POUR BTL) OPTIME
TOPICAL | Status: DC | PRN
  Administered 2023-12-14: 1000 mL

## 2023-12-14 MED ORDER — MIDAZOLAM HCL 2 MG/2ML IJ SOLN: 1.0000 mg | Freq: Once | INTRAMUSCULAR | Status: AC

## 2023-12-14 MED ORDER — FENTANYL CITRATE (PF) 100 MCG/2ML IJ SOLN
25.0000 ug | INTRAMUSCULAR | Status: DC | PRN
  Administered 2023-12-14 (×2): 50 ug via INTRAVENOUS

## 2023-12-14 MED ORDER — PROPOFOL 500 MG/50ML IV EMUL
INTRAVENOUS | Status: DC | PRN
  Administered 2023-12-14: 125 ug/kg/min via INTRAVENOUS

## 2023-12-14 MED ORDER — LIDOCAINE HCL (PF) 1 % IJ SOLN
INTRAMUSCULAR | Status: AC
  Filled 2023-12-14: qty 30

## 2023-12-14 MED ORDER — LIDOCAINE HCL 1 % IJ SOLN
INTRAMUSCULAR | Status: DC | PRN
  Administered 2023-12-14: 10 mL via INTRAMUSCULAR

## 2023-12-14 MED ORDER — BUPIVACAINE-EPINEPHRINE (PF) 0.25% -1:200000 IJ SOLN
INTRAMUSCULAR | Status: AC
Start: 2023-12-14 — End: 2023-12-14
  Filled 2023-12-14: qty 30

## 2023-12-14 MED ORDER — CHLORHEXIDINE GLUCONATE 0.12 % MT SOLN
OROMUCOSAL | Status: AC
  Filled 2023-12-14: qty 15

## 2023-12-14 MED ORDER — DEXAMETHASONE SODIUM PHOSPHATE 10 MG/ML IJ SOLN
INTRAMUSCULAR | Status: DC | PRN
  Administered 2023-12-14: 10 mg via PERINEURAL

## 2023-12-14 MED ORDER — LIDOCAINE HCL 1 % IJ SOLN
INTRAMUSCULAR | Status: AC
Start: 2023-12-14 — End: 2023-12-14
  Filled 2023-12-14: qty 20

## 2023-12-14 MED ORDER — FENTANYL CITRATE (PF) 100 MCG/2ML IJ SOLN
INTRAMUSCULAR | Status: AC
  Filled 2023-12-14: qty 2

## 2023-12-14 MED ORDER — OXYCODONE HCL 5 MG/5ML PO SOLN: 5.0000 mg | Freq: Once | ORAL | Status: AC | PRN

## 2023-12-14 MED ORDER — ONDANSETRON HCL 4 MG/2ML IJ SOLN: 4.0000 mg | Freq: Once | INTRAMUSCULAR | Status: DC | PRN

## 2023-12-14 MED ORDER — ACETAMINOPHEN 10 MG/ML IV SOLN: 1000.0000 mg | Freq: Once | INTRAVENOUS | Status: DC | PRN

## 2023-12-14 MED ORDER — LACTATED RINGERS IV SOLN: INTRAVENOUS | Status: DC | PRN

## 2023-12-14 MED ORDER — MAGTRACE LYMPHATIC TRACER
INTRAMUSCULAR | Status: DC | PRN
  Administered 2023-12-14: 2 mL via INTRAMUSCULAR

## 2023-12-14 MED ORDER — ROPIVACAINE HCL 5 MG/ML IJ SOLN
INTRAMUSCULAR | Status: DC | PRN
  Administered 2023-12-14: 30 mL via PERINEURAL

## 2023-12-14 MED ORDER — MIDAZOLAM HCL 2 MG/2ML IJ SOLN
INTRAMUSCULAR | Status: AC
  Administered 2023-12-14: 1 mg via INTRAVENOUS
  Filled 2023-12-14: qty 2

## 2023-12-14 MED ORDER — LIDOCAINE 2% (20 MG/ML) 5 ML SYRINGE
INTRAMUSCULAR | Status: AC
  Filled 2023-12-14: qty 5

## 2023-12-14 MED ORDER — OXYCODONE HCL 5 MG PO TABS: 5.0000 mg | ORAL_TABLET | Freq: Four times a day (QID) | ORAL | 0 refills | Status: DC | PRN

## 2023-12-14 MED ORDER — OXYCODONE HCL 5 MG PO TABS
ORAL_TABLET | ORAL | Status: AC
  Filled 2023-12-14: qty 1

## 2023-12-14 MED ORDER — OXYCODONE HCL 5 MG PO TABS
5.0000 mg | ORAL_TABLET | Freq: Once | ORAL | Status: AC | PRN
  Administered 2023-12-14: 5 mg via ORAL

## 2023-12-14 MED ORDER — ONDANSETRON HCL 4 MG/2ML IJ SOLN
INTRAMUSCULAR | Status: DC | PRN
  Administered 2023-12-14: 4 mg via INTRAVENOUS

## 2023-12-14 MED ORDER — PROPOFOL 10 MG/ML IV BOLUS
INTRAVENOUS | Status: DC | PRN
  Administered 2023-12-14: 50 mg via INTRAVENOUS
  Administered 2023-12-14: 150 mg via INTRAVENOUS

## 2023-12-14 MED ORDER — PROPOFOL 10 MG/ML IV BOLUS
INTRAVENOUS | Status: AC
  Filled 2023-12-14: qty 20

## 2023-12-14 MED ORDER — DEXMEDETOMIDINE HCL IN NACL 80 MCG/20ML IV SOLN
INTRAVENOUS | Status: DC | PRN
  Administered 2023-12-14: 10 ug via INTRAVENOUS

## 2023-12-14 MED ORDER — PHENYLEPHRINE 80 MCG/ML (10ML) SYRINGE FOR IV PUSH (FOR BLOOD PRESSURE SUPPORT)
PREFILLED_SYRINGE | INTRAVENOUS | Status: DC | PRN
  Administered 2023-12-14 (×2): 80 ug via INTRAVENOUS

## 2023-12-14 MED ORDER — ACETAMINOPHEN 500 MG PO TABS
1000.0000 mg | ORAL_TABLET | ORAL | Status: AC
  Administered 2023-12-14: 1000 mg via ORAL
  Filled 2023-12-14: qty 2

## 2023-12-14 MED ORDER — CEFAZOLIN SODIUM-DEXTROSE 2-4 GM/100ML-% IV SOLN
2.0000 g | INTRAVENOUS | Status: AC
  Administered 2023-12-14: 2 g via INTRAVENOUS
  Filled 2023-12-14: qty 100

## 2023-12-14 SURGICAL SUPPLY — 39 items
BAG COUNTER SPONGE SURGICOUNT (BAG) ×3 IMPLANT
BINDER BREAST LRG (GAUZE/BANDAGES/DRESSINGS) IMPLANT
BINDER BREAST XLRG (GAUZE/BANDAGES/DRESSINGS) IMPLANT
CANISTER SUCTION 3000ML PPV (SUCTIONS) IMPLANT
CHLORAPREP W/TINT 26 (MISCELLANEOUS) ×3 IMPLANT
CLIP TI LARGE 6 (CLIP) ×3 IMPLANT
CNTNR URN SCR LID CUP LEK RST (MISCELLANEOUS) IMPLANT
COVER MAYO STAND STRL (DRAPES) IMPLANT
COVER PROBE W GEL 5X96 (DRAPES) ×3 IMPLANT
COVER SURGICAL LIGHT HANDLE (MISCELLANEOUS) ×3 IMPLANT
DERMABOND ADVANCED .7 DNX12 (GAUZE/BANDAGES/DRESSINGS) ×3 IMPLANT
DEVICE DUBIN SPECIMEN MAMMOGRA (MISCELLANEOUS) ×3 IMPLANT
ELECT COATED BLADE 2.86 ST (ELECTRODE) ×3 IMPLANT
ELECTRODE REM PT RTRN 9FT ADLT (ELECTROSURGICAL) ×3 IMPLANT
GAUZE PAD ABD 8X10 STRL (GAUZE/BANDAGES/DRESSINGS) ×3 IMPLANT
GAUZE SPONGE 4X4 12PLY STRL (GAUZE/BANDAGES/DRESSINGS) IMPLANT
GAUZE SPONGE 4X4 12PLY STRL LF (GAUZE/BANDAGES/DRESSINGS) ×3 IMPLANT
GLOVE BIO SURGEON STRL SZ 6 (GLOVE) ×3 IMPLANT
GLOVE INDICATOR 6.5 STRL GRN (GLOVE) ×3 IMPLANT
GOWN STRL REUS W/ TWL LRG LVL3 (GOWN DISPOSABLE) ×3 IMPLANT
GOWN STRL REUS W/ TWL XL LVL3 (GOWN DISPOSABLE) ×3 IMPLANT
KIT BASIN OR (CUSTOM PROCEDURE TRAY) ×3 IMPLANT
KIT MARKER MARGIN INK (KITS) ×3 IMPLANT
LIGHT WAVEGUIDE WIDE FLAT (MISCELLANEOUS) IMPLANT
NDL HYPO 25GX1X1/2 BEV (NEEDLE) ×3 IMPLANT
NEEDLE HYPO 25GX1X1/2 BEV (NEEDLE) ×2 IMPLANT
PACK GENERAL/GYN (CUSTOM PROCEDURE TRAY) ×3 IMPLANT
PACK UNIVERSAL I (CUSTOM PROCEDURE TRAY) IMPLANT
SOLN 0.9% NACL 1000 ML (IV SOLUTION) IMPLANT
SOLN 0.9% NACL POUR BTL 1000ML (IV SOLUTION) IMPLANT
STRIP CLOSURE SKIN 1/2X4 (GAUZE/BANDAGES/DRESSINGS) ×3 IMPLANT
SUT MNCRL AB 4-0 PS2 18 (SUTURE) ×3 IMPLANT
SUT SILK 2 0 SH (SUTURE) IMPLANT
SUT VIC AB 2-0 SH 27XBRD (SUTURE) IMPLANT
SUT VIC AB 3-0 SH 27X BRD (SUTURE) ×3 IMPLANT
SUT VIC AB 3-0 SH 8-18 (SUTURE) IMPLANT
SYR CONTROL 10ML LL (SYRINGE) ×3 IMPLANT
TOWEL GREEN STERILE (TOWEL DISPOSABLE) ×3 IMPLANT
TOWEL GREEN STERILE FF (TOWEL DISPOSABLE) ×3 IMPLANT

## 2023-12-14 NOTE — Anesthesia Procedure Notes (Signed)
 Procedure Name: LMA Insertion Date/Time: 12/14/2023 10:02 AM  Performed by: Marva Lonni PARAS, CRNAPre-anesthesia Checklist: Patient identified, Emergency Drugs available, Suction available and Patient being monitored Patient Re-evaluated:Patient Re-evaluated prior to induction Oxygen Delivery Method: Circle System Utilized Preoxygenation: Pre-oxygenation with 100% oxygen Induction Type: IV induction Ventilation: Mask ventilation without difficulty LMA: LMA inserted LMA Size: 4.0 Number of attempts: 1 Airway Equipment and Method: Bite block Placement Confirmation: positive ETCO2 Tube secured with: Tape Dental Injury: Teeth and Oropharynx as per pre-operative assessment

## 2023-12-14 NOTE — Anesthesia Preprocedure Evaluation (Signed)
 Anesthesia Evaluation  Patient identified by MRN, date of birth, ID band Patient awake    Reviewed: Allergy & Precautions, NPO status , Patient's Chart, lab work & pertinent test results, reviewed documented beta blocker date and time   History of Anesthesia Complications (+) PONV, Family history of anesthesia reaction and history of anesthetic complications  Airway Mallampati: III  TM Distance: >3 FB     Dental no notable dental hx.    Pulmonary neg COPD, neg PE   breath sounds clear to auscultation       Cardiovascular hypertension, (-) angina (-) CAD and (-) Past MI  Rhythm:Regular Rate:Normal     Neuro/Psych neg Seizures    GI/Hepatic ,,,(+) Hepatitis -, C  Endo/Other  Hypothyroidism    Renal/GU Renal disease     Musculoskeletal   Abdominal   Peds  Hematology   Anesthesia Other Findings   Reproductive/Obstetrics                              Anesthesia Physical Anesthesia Plan  ASA: 2  Anesthesia Plan: General   Post-op Pain Management: Regional block*   Induction: Intravenous  PONV Risk Score and Plan: 2 and Ondansetron , Dexamethasone, Propofol infusion and TIVA  Airway Management Planned: LMA  Additional Equipment:   Intra-op Plan:   Post-operative Plan: Extubation in OR  Informed Consent: I have reviewed the patients History and Physical, chart, labs and discussed the procedure including the risks, benefits and alternatives for the proposed anesthesia with the patient or authorized representative who has indicated his/her understanding and acceptance.     Dental advisory given  Plan Discussed with: CRNA  Anesthesia Plan Comments:          Anesthesia Quick Evaluation

## 2023-12-14 NOTE — Interval H&P Note (Signed)
 History and Physical Interval Note:  12/14/2023 8:35 AM  Amanda Herring  has presented today for surgery, with the diagnosis of RIGHT BREAST CANCER.  The various methods of treatment have been discussed with the patient and family. After consideration of risks, benefits and other options for treatment, the patient has consented to  Procedure(s) with comments: BREAST LUMPECTOMY (Right) BIOPSY, LYMPH NODE, SENTINEL (Right) - SENTINEL LYMPH NODE BIOPSY as a surgical intervention.  The patient's history has been reviewed, patient examined, no change in status, stable for surgery.  I have reviewed the patient's chart and labs.  Questions were answered to the patient's satisfaction.     Jina Nephew

## 2023-12-14 NOTE — Anesthesia Postprocedure Evaluation (Signed)
 Anesthesia Post Note  Patient: Amanda Herring  Procedure(s) Performed: BREAST LUMPECTOMY (Right: Breast) BIOPSY, LYMPH NODE, SENTINEL (Right)     Patient location during evaluation: PACU Anesthesia Type: General Level of consciousness: awake and alert Pain management: pain level controlled Vital Signs Assessment: post-procedure vital signs reviewed and stable Respiratory status: spontaneous breathing, nonlabored ventilation, respiratory function stable and patient connected to nasal cannula oxygen Cardiovascular status: blood pressure returned to baseline and stable Postop Assessment: no apparent nausea or vomiting Anesthetic complications: no   No notable events documented.  Last Vitals:  Vitals:   12/14/23 1200 12/14/23 1215  BP: 113/73 (!) 105/58  Pulse: (!) 57 (!) 56  Resp: 19 13  Temp:  36.7 C  SpO2: 99% 95%    Last Pain:  Vitals:   12/14/23 1215  TempSrc:   PainSc: 3                  Lynwood MARLA Cornea

## 2023-12-14 NOTE — Op Note (Signed)
 Right Breast Lumpectomy with Sentinel Node Mapping and Biopsy Procedure Note  Indications: This patient presents with history of right breast cancer with clinically negative axillary lymph node exam.  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 The risks, benefits, complications, treatment options, and expected outcomes were discussed with the patient. The possibilities of reaction to medication, pulmonary aspiration, bleeding, infection, the need for additional procedures, failure to diagnose a condition, and creating a complication requiring transfusion or operation were discussed with the patient. The patient concurred with the proposed plan, giving informed consent.  The site of surgery properly noted/marked. The patient was taken to Operating Room # 2, identified as Amanda Herring and the procedure verified as Right Breast Lumpectomy and Sentinel Node Biopsy. A Time Out was held and the above information confirmed.  The magtrace was injected into the parenchymal tissue and massaged for 5 minutes.    After induction of anesthesia, the right arm, breast, and chest were prepped and draped in standard fashion.  The lumpectomy was performed by creating a superolateral incision near the mass.  Skin hooks were used to elevate the skin and the cautery was used to dissect around the mass.  Dissection was carried down to the pectoral fascia/anterior implant capsule.  The specimen was marked with the margin marker paint kit.  The specimen was placed in the faxatron to confirm presence on the biopsy clip.  Hemostasis was achieved with cautery.  Large clips were placed on the specimen cavity edges for radiation other  than the posterior margin which is the anterior implant capsule.  Additional margins were taken superiorly, medially, inferiorly, and laterally. The wound was irrigated and closed with a 3-0 Vicryl deep dermal interrupted and a 4-0 Monocryl subcuticular closure in layers.  Using a Sentimag probe, axillary sentinel nodes were identified transcutaneously.  An oblique incision was created below the axillary hairline.  Dissection was carried through the clavipectoral fascia.  Two deep level 2 axillary sentinel nodes were removed. Lymphovascular channels were clipped.  The wound was irrigated.  Hemostasis was achieved with cautery and clips.  The axillary incision was closed with 3-0 vicryl deep dermal interrupted sutures and 4-0 monocryl subcuticular closure in layers.      Sterile dressings were applied. At the end of the operation, all sponge, instrument, and needle counts were correct.  Findings: grossly clear surgical margins and anterior margin is areola, posterior margin is implant capsule, right sentinel node #1 rust colored, right sentinel node #2 palpable  Estimated Blood Loss:  Minimal         Specimens: right breast lumpectomy, additional superior, medial, inferior, lateral margins, right axillary sentinel node #1, right axillary sentinel node #2                Complications:  None; patient tolerated the procedure well.         Disposition: PACU - hemodynamically stable.         Condition: stable

## 2023-12-14 NOTE — Discharge Instructions (Addendum)
 [facility] Office Phone Number [phone]  BREAST BIOPSY/ PARTIAL MASTECTOMY: POST OP INSTRUCTIONS  Always review your discharge instruction sheet given to you by the facility where your surgery was performed.  IF YOU HAVE DISABILITY OR FAMILY LEAVE FORMS, YOU MUST BRING THEM TO THE OFFICE FOR PROCESSING.  DO NOT GIVE THEM TO YOUR DOCTOR.  Take 2 tylenol  (acetominophen) three times a day for 3 days.  If you still have pain, add ibuprofen with food in between if able to take this (if you have kidney issues or stomach issues, do not take ibuprofen).  If both of those are not enough, add the narcotic pain pill.  If you find you are needing a lot of this overnight after surgery, call the next morning for a refill.    Prescriptions will not be filled after 5pm or on week-ends. Take your usually prescribed medications unless otherwise directed You should eat very light the first 24 hours after surgery, such as soup, crackers, pudding, etc.  Resume your normal diet the day after surgery. Most patients will experience some swelling and bruising in the breast.  Ice packs and a good support bra will help.  Swelling and bruising can take several days to resolve.  It is common to experience some constipation if taking pain medication after surgery.  Increasing fluid intake and taking a stool softener will usually help or prevent this problem from occurring.  A mild laxative (Milk of Magnesia or Miralax ) should be taken according to package directions if there are no bowel movements after 48 hours. Unless discharge instructions indicate otherwise, you may remove your bandages 48 hours after surgery, and you may shower at that time.  You may have steri-strips (small skin tapes) in place directly over the incision.  These strips should be left on the skin at least for for 7-10 days.    ACTIVITIES:  You may resume regular daily activities (gradually increasing) beginning the next day.  Wearing a  good support bra or sports bra (or the breast binder) minimizes pain and swelling.  You may have sexual intercourse when it is comfortable. No heavy lifting for 1-2 weeks (not over around 10 pounds).  You may drive when you no longer are taking prescription pain medication, you can comfortably wear a seatbelt, and you can safely maneuver your car and apply brakes. RETURN TO WORK:  __________3-14 days depending on job. _______________ Amanda Herring should see your doctor in the office for a follow-up appointment approximately two weeks after your surgery.  Your doctor's nurse will typically make your follow-up appointment when she calls you with your pathology report.  Expect your pathology report 3-4 business days after your surgery.  You may call to check if you do not hear from us  after three days.   WHEN TO CALL YOUR DOCTOR: Fever over 101.0 Nausea and/or vomiting. Extreme swelling or bruising. Continued bleeding from incision. Increased pain, redness, or drainage from the incision.  The clinic staff is available to answer your questions during regular business hours.  Please don't hesitate to call and ask to speak to one of the nurses for clinical concerns.  If you have a medical emergency, go to the nearest emergency room or call 911.  A surgeon from [facility] is always on call at the hospital.  For further questions, please visit [email or web address]

## 2023-12-14 NOTE — Anesthesia Procedure Notes (Signed)
 Anesthesia Regional Block: Pectoralis block   Pre-Anesthetic Checklist: , timeout performed,  Correct Patient, Correct Site, Correct Laterality,  Correct Procedure, Correct Position, site marked,  Risks and benefits discussed,  Surgical consent,  Pre-op evaluation,  At surgeon's request and post-op pain management  Laterality: Right  Prep: Maximum Sterile Barrier Precautions used, chloraprep       Needles:  Injection technique: Single-shot  Needle Type: Echogenic Needle      Needle Gauge: 20     Additional Needles:   Procedures:,,,, ultrasound used (permanent image in chart),,    Narrative:  Start time: 12/14/2023 9:42 AM End time: 12/14/2023 9:45 AM Injection made incrementally with aspirations every 5 mL.  Performed by: Personally  Anesthesiologist: Keneth Lynwood POUR, MD

## 2023-12-14 NOTE — Transfer of Care (Signed)
 Immediate Anesthesia Transfer of Care Note  Patient: Amanda Herring  Procedure(s) Performed: BREAST LUMPECTOMY (Right: Breast) BIOPSY, LYMPH NODE, SENTINEL (Right)  Patient Location: PACU  Anesthesia Type:General  Level of Consciousness: awake, drowsy, and patient cooperative  Airway & Oxygen Therapy: Patient Spontanous Breathing  Post-op Assessment: Report given to RN and Post -op Vital signs reviewed and stable  Post vital signs: Reviewed and stable  Last Vitals:  Vitals Value Taken Time  BP 107/66 12/14/23 11:27  Temp    Pulse 61 12/14/23 11:29  Resp 13 12/14/23 11:29  SpO2 93 % 12/14/23 11:29  Vitals shown include unfiled device data.  Last Pain:  Vitals:   12/14/23 0940  TempSrc:   PainSc: 0-No pain         Complications: No notable events documented.

## 2023-12-15 ENCOUNTER — Encounter

## 2023-12-15 ENCOUNTER — Encounter (HOSPITAL_COMMUNITY): Payer: Self-pay | Admitting: General Surgery

## 2023-12-16 LAB — SURGICAL PATHOLOGY

## 2023-12-19 ENCOUNTER — Ambulatory Visit: Payer: Self-pay | Admitting: General Surgery

## 2023-12-19 ENCOUNTER — Other Ambulatory Visit: Payer: Self-pay | Admitting: General Surgery

## 2023-12-19 NOTE — Telephone Encounter (Addendum)
 Left message on machine to discuss pathology.  Lymph nodes negative, but focally positive anterior and posterior margins.   Would like to reexcise.  Will call back later today.    Called back and discussed.  Will plan reexcision.  Submitting posting sheet and orders today.

## 2023-12-20 ENCOUNTER — Ambulatory Visit: Attending: Orthopedic Surgery

## 2023-12-20 ENCOUNTER — Encounter: Admitting: Physical Therapy

## 2023-12-20 DIAGNOSIS — M79604 Pain in right leg: Secondary | ICD-10-CM | POA: Insufficient documentation

## 2023-12-20 DIAGNOSIS — M6281 Muscle weakness (generalized): Secondary | ICD-10-CM | POA: Diagnosis present

## 2023-12-20 NOTE — Therapy (Signed)
 OUTPATIENT PHYSICAL THERAPY LOWER EXTREMITY ORTHO TREATMENT   Patient Name: Amanda Herring MRN: [id number] DOB:1955/09/28, 68 y.o., female Today's Date: 12/20/2023  END OF SESSION:  PT End of Session - 12/20/23 1108     Visit Number 9    Number of Visits 16    Date for Recertification  01/12/24    Authorization Type [employer]    Progress Note Due on Visit 10    PT Start Time 1104    PT Stop Time 1145    PT Time Calculation (min) 41 min    Activity Tolerance Patient tolerated treatment well    Behavior During Therapy WFL for tasks assessed/performed           Past Medical History:  Diagnosis Date   Cancer (HCC)    Family history of adverse reaction to anesthesia    per patient her dad had post n/v   Family history of bladder cancer    Hepatitis C    s/p treatment 2012   Hypertension    Hypothyroidism    Kidney stone    PONV (postoperative nausea and vomiting)    Past Surgical History:  Procedure Laterality Date   ABDOMINAL HYSTERECTOMY     BREAST BIOPSY Right 11/04/2023   US  RT BREAST BX W LOC DEV 1ST LESION IMG BX SPEC US  GUIDE 11/04/2023 [facility]   BREAST EXCISIONAL BIOPSY Bilateral    1981/1980   BREAST LUMPECTOMY Left    1998   BREAST LUMPECTOMY Right 12/14/2023   Procedure: BREAST LUMPECTOMY;  Surgeon: Aron Shoulders, MD;  Location: [facility] OR;  Service: General;  Laterality: Right;   BREAST SURGERY     augmentation   SENTINEL NODE BIOPSY Right 12/14/2023   Procedure: BIOPSY, LYMPH NODE, SENTINEL;  Surgeon: Aron Shoulders, MD;  Location: [facility] OR;  Service: General;  Laterality: Right;  SENTINEL LYMPH NODE BIOPSY   Patient Active Problem List   Diagnosis Date Noted   Genetic testing 11/28/2023   Family history of bladder cancer    Malignant neoplasm of upper-outer quadrant of right breast in female, estrogen receptor positive (HCC) 11/11/2023    PCP: Dyane Anthony RAMAN, FNP   REFERRING PROVIDER:   Edna Toribio LABOR, MD    REFERRING  DIAG: RIGHT PES BURSA/RIGHT HAMSTRING STRAIN   THERAPY DIAG:  Muscle weakness (generalized)  Pain in right leg  Rationale for Evaluation and Treatment: Rehabilitation  ONSET DATE: jan 2025  SUBJECTIVE:   SUBJECTIVE STATEMENT: I had my breast surgery on 12/14/23 and my doctor said I am clear to cont PT as long as we don't do much with my upper body. They already got my lab results. They didn't get clear margins so I have to have a second surgery on 12/29/23. I haven't been able to do as many of my exs since then. I have been stretching a lot but just can't walk much right now.  My Rt knee has been feeling a little better this week as I have had to rest more. I have noticed when I straighten my knee sometimes it doesn't feel like it wants to stop.     PERTINENT HISTORY: Malignant neoplasm of upper-outer quadrant of right breast in female, estrogen receptor positive (HCC)  PAIN:  Are you having pain? Yes: NPRS scale: 3/10  Pain location: Rt medial knee to inferior to patella Pain description: dull achy (constant) Aggravating factors: walking, carrying 69 month old grand daughter, end of the day Relieving factors: rest, propping, ice, advil  PRECAUTIONS: Other: cancer pt  RED FLAGS: None   WEIGHT BEARING RESTRICTIONS: No  FALLS:  Has patient fallen in last 6 months? No  LIVING ENVIRONMENT: Lives with: lives alone Lives in: House/apartment Stairs: No Has following equipment at home: None  OCCUPATION: retired   PLOF: Independent  PATIENT GOALS: to have less pain to go back to walking (was doing 1-2 miles)  NEXT MD VISIT: nothing planned   OBJECTIVE:  Note: Objective measures were completed at Evaluation unless otherwise noted.  DIAGNOSTIC FINDINGS: no imagining for hamstring or knee  PATIENT SURVEYS:  LEFS  Extreme difficulty/unable (0), Quite a bit of difficulty (1), Moderate difficulty (2), Little difficulty (3), No difficulty (4) Survey date:  11/17/23  Any of  your usual work, housework or school activities 1  2. Usual hobbies, recreational or sporting activities 0  3. Getting into/out of the bath 1  4. Walking between rooms 1  5. Putting on socks/shoes 2  6. Squatting  1  7. Lifting an object, like a bag of groceries from the floor 1  8. Performing light activities around your home 1  9. Performing heavy activities around your home 0  10. Getting into/out of a car 1  11. Walking 2 blocks 0  12. Walking 1 mile 0  13. Going up/down 10 stairs (1 flight) 1  14. Standing for 1 hour 1  15.  sitting for 1 hour 4  16. Running on even ground 0  17. Running on uneven ground 0  18. Making sharp turns while running fast 0  19. Hopping  0  20. Rolling over in bed 3  Score total:  18     COGNITION: Overall cognitive status: Within functional limits for tasks assessed     SENSATION: WFL  EDEMA:  Circumferential: 49cm diameter (7cm below patella) Rt  44cm Lt same position  MUSCLE LENGTH: Rt hamstring limited by 25% with palpable tension at medial hamstring   POSTURE: posterior pelvic tilt  PALPATION: TTP at Rt medial knee joint line, medial hamstrings, medial fibers of quad, and distal adductor  LOWER EXTREMITY ROM:  Full ROM at knee ext and flexion without pain, Rt hip ER at end range at replicated pain but full range.   LOWER EXTREMITY MMT:  Bil hips grossly 4/5 except Rt hip flexion 3+/5  LOWER EXTREMITY SPECIAL TESTS:  Hip special tests: SI compression test: neg , SI distraction test: negative, Hip scouring test: negative, and Piriformis test: positive    FUNCTIONAL TESTS:  5 times sit to stand: 11s no AD lowest table setting 2 minute walk test: 384' no AD  GAIT: Distance walked: TWO MINUTE WALK TEST *individual walks without assistance for 2 minutes and the distance is measured *start timing when the individual is instructed to "Go" *stop timing at 2 minutes *assistive devices can be used but should be kept consistent and   documented from test to test  *if physical assistance is required to walk, this should not be performed * a measuring wheel is helpful to determine distance walked *should be performed at the fastest speed possible   Gender Age in years (# in study) Mean distance in meters Female  18-54 (260)   200.9 55-59 (23)   191.0 60-64 (29)   179.1 (587 feet) 65-69 (22)   184.2 70-74 (32)   172.2 75-79 (19)   157.6 80-85 (32)   144.1  Female 18-54 (539)   183.0 55-59 (30)   176.4 60-64 (48)  166.4 65-69 (22)   155.2 70-74 (33)   145.9 75-79 (14)   140.9 80-85 (34)   134.3  Assistive device utilized: None Level of assistance: Complete Independence Comments: trunk flexion, trunk lateral sway for limb advancement, increased step length at Rt with increased pain with attempts to shorten step, decreased cadence                                                                                                                                TREATMENT DATE:   12/20/23: Therapeutic Exercises NuStep, LE 7, no UE's: Pt wanted to try briefly level 7 x 2 mins, then level 6 x 1 min, then finished last 4 mins at level 5; reminded pt not to push for prolonged periods of time as she is recovering from recent breast surgery and stopped at 7 mins total as pt became very fatigued and SOB so had pt stop and encouraged deep breathing, she recovered fairly quickly. Rt TKE against blue ball on wall x 20 reps returning therapist demo LAQ 7#, 3 x 10 with Rt LE Lt S/L for Rt clam with red theraband above knees 4 x 10 Rt SLR 2# on ankle 2 x 10 Therapeutic Activities In // bars: Mini squats standing on purple airex 2 x 10 with brief rest between sets. Able to do with no UE support and VC's with demo for correct technique Standing on purple airex for bil heel raises 2 x 10 encouraging Lt hand fingertip support during for improved stability Bil leg press with less weight and VC's to ensure pt was relaxed in upper body  which she reports having no trouble with: 60# x 30, pt reports this feeing reasonably easy   9/30: Discussed using a cane for walking longer distances: cane on left to offset right  Discussion use of floor bike for knee ROM  Nu-Step 10 min L4 Les only (therapist monitoring response and getting status update)  LAQ 7# right 30x (increase to 8#) Standing heel raises 10x left hand on wall  Modified single leg dead lift 4# to knee level (pulls a lot)  Leg press seat 6 next time) 75# 20 x2; right/left 35# 15x  Hip matrix 40# hip abduction and hip extension 15x right/ 5x left (more difficult with WB on right) SLR 2# 10x  Sidelying 2# hip abduction 10x Sidelying 2# hip adduction 10x Pain 2/10  9/25: Nu-Step 10 min L4 Les only (therapist monitoring response and getting status update) 117 spm average Sidelying clam with added green band (shortened band/tighter for added resistance) 10x right/left (verbal and tactile cues for technique)  Sit to stand  with green band around thighs to activate glutes 10x  (much improved technique) LAQ 7# right 10x 2 (feels heavy but not painful) Seated hip adduction with green band 2x10 Standing heel raises 10x, staggered Leg press seat 7 (move to seat 6 next time) 70# 20x, plus 10x more after single  legs per pt request; right/left 35# 15x  Hip matrix 40# hip abduction and hip extension 10x right/ 7x left (more difficult with WB on right)     PATIENT EDUCATION:  Education details: [email or web address] Person educated: Patient Education method: Explanation Education comprehension: verbalized understanding, returned demonstration, verbal cues required, tactile cues required, and needs further education  HOME EXERCISE PROGRAM: Access Code: [email or web address] URL: [email or web address] Date: 11/22/2023 Prepared by: Glade Pesa  Exercises - Seated Table Hamstring Stretch  - 1 x daily - 7 x weekly - 1 sets - 3 reps - 30s holds - Supine Piriformis Stretch  - 1 x  daily - 7 x weekly - 1 sets - 3 reps - 30s holds - Standing Gastroc Stretch on Step with Counter Support  - 1 x daily - 7 x weekly - 1 sets - 3 reps - 30s hold - Supine Hip Internal and External Rotation  - 1 x daily - 7 x weekly - 1 sets - 10 reps - 5s holds - Clamshell  - 1 x daily - 7 x weekly - 1 sets - 10 reps - Prone Gluteal Sets  - 1 x daily - 7 x weekly - 1 sets - 10 reps - Sit to Stand  - 1 x daily - 7 x weekly - 1 sets - 10 reps - Gastroc Stretch on Wall  - 1 x daily - 7 x weekly - 3 reps - 30 hold  ASSESSMENT:  CLINICAL IMPRESSION: Pt returns to physical therapy after having her Rt breast lumpectomy on 12/14/23. Surgeon had cleared pt to return after 1 week as long as no straining or upper body involvement. Dr. Aron called pt yesterday to let her know that clear margins were NOT attained and she will need a second surgery. This is scheduled for 12/29/23 and will possibly be a modified mastectomy per pt report. Messaged Dr. Aron to ask how long we should place pt on hold again, if 1 week is ok again or would she like longer as this will be a bit more involved of a surgery. Today resumed her Rt LE strengthening activities but was cautious throughout session about pt not using her upper body at all and to not allow her to strain or tense with exercises either. Lessened weight with leg press and did not do hip machine as pt would have to support herself with UE's. Pt reports not feeling any pain or discomfort with exs done today and was surprised at how fatigue she felt. Reminded her that she did just recently have surgery and she needs to cont not pushing herself much as she is recovering but walking is ok and gentle ROM she received from Eward Sharps, PT before surgery. Pt verbalized good understanding.    OBJECTIVE IMPAIRMENTS: Abnormal gait, decreased activity tolerance, decreased balance, decreased coordination, decreased endurance, decreased mobility, difficulty walking, decreased ROM,  decreased strength, increased fascial restrictions, impaired perceived functional ability, increased muscle spasms, impaired flexibility, improper body mechanics, postural dysfunction, and pain.   ACTIVITY LIMITATIONS: carrying, lifting, bending, standing, squatting, stairs, transfers, and locomotion level  PARTICIPATION LIMITATIONS: meal prep, cleaning, laundry, community activity, and yard work  PERSONAL FACTORS: Fitness, Past/current experiences, Time since onset of injury/illness/exacerbation, and 1 comorbidity: medical history are also affecting patient's functional outcome.   REHAB POTENTIAL: Good  CLINICAL DECISION MAKING: Stable/uncomplicated  EVALUATION COMPLEXITY: Low   GOALS: Goals reviewed with patient? Yes  SHORT TERM GOALS: Target date: 12/15/23 Pt to be I with HEP for carry  over and continuing recommendations for improved outcomes.   Baseline: Goal status: met 9/30  2.  Pt to demonstrate full ROM of Rt hip without pain for improved gait mechanics to tolerate return to PLOF for walking.  Baseline:  Goal status: partially met   3.  Pt to demonstrate x10 sit to stands without pain or compensation for improved hip strength for walking. Baseline:  Goal status: met 9/30  4.  Pt to demonstrate 5/5 bil hip strength without pain for improved ability to transfer from ground to standing to return to gardening Baseline:  Goal status: partially met, knee pain affects transfers   LONG TERM GOALS: Target date: 01/12/24  Pt to be I with advanced HEP for carry over and continuing recommendations for improved outcomes.   Baseline:  Goal status: INITIAL  2.  Pt to report ability to tolerate walking at least 1 mile with no more than 1/10 pain for improved QOL.  Baseline:  Goal status: INITIAL  3.  Pt to demonstrate improved within age related norms due to improved cadence and mechanics of gait for improved ability to complete grocery errands.  Baseline:  Goal status:  INITIAL  4.  Pt to demonstrate improved 5xSTS to no more than 7s for decreased fall risk.  Baseline:  Goal status: INITIAL    PLAN:  PT FREQUENCY: 2x/week  PT DURATION: 8 weeks  PLANNED INTERVENTIONS: 97110-Therapeutic exercises, 97530- Therapeutic activity, [id number]- Neuromuscular re-education, 97535- Self Care, 02859- Manual therapy, [phone]- Gait training, [phone]- Canalith repositioning, [id number]- Aquatic Therapy, [phone]- Electrical stimulation (manual), [id number]- Vasopneumatic device, [id number]- Traction (mechanical), [id number]- Ionotophoresis 4mg /ml Dexamethasone, 20560 (1-2 muscles), 20561 (3+ muscles)- Dry Needling, Patient/Family education, Taping, Joint mobilization, Spinal mobilization, Scar mobilization, DME instructions, Cryotherapy, Moist heat, and Biofeedback  PLAN FOR NEXT SESSION: Cont Rt LE strength with no UE involvement and cuing to keep upper body relaxed with all activities; Change appts accordingly when we hear back from Dr. Aron; Cont gluteal activation ex's; Nu-Step Les only; stretch hamstrings, adductors, hip strengthening, standing balance progressing pt as pain tolerance allows.   Berwyn Knights, PTA 12/20/23 11:58 AM  [facility] [address] [place], [place] [address] Phone # [phone] Fax [phone]

## 2023-12-21 ENCOUNTER — Telehealth: Payer: Self-pay | Admitting: *Deleted

## 2023-12-21 ENCOUNTER — Encounter: Payer: Self-pay | Admitting: *Deleted

## 2023-12-21 NOTE — Telephone Encounter (Signed)
Ordered oncotype per Dr. Al Pimple. Requisition sent to pathology and exact sciences

## 2023-12-22 ENCOUNTER — Encounter: Admitting: Physical Therapy

## 2023-12-22 ENCOUNTER — Ambulatory Visit: Admitting: Physical Therapy

## 2023-12-22 DIAGNOSIS — M79604 Pain in right leg: Secondary | ICD-10-CM

## 2023-12-22 DIAGNOSIS — M6281 Muscle weakness (generalized): Secondary | ICD-10-CM | POA: Diagnosis not present

## 2023-12-22 NOTE — Therapy (Signed)
 OUTPATIENT PHYSICAL THERAPY LOWER EXTREMITY ORTHO TREATMENT   Patient Name: Amanda Herring MRN: [id number] DOB:12-19-1955, 68 y.o., female Today's Date: 12/22/2023  Progress Note Reporting Period 11/17/23 to 12/22/23  See note below for Objective Data and Assessment of Progress/Goals.      END OF SESSION:  PT End of Session - 12/22/23 1230     Visit Number 10    Number of Visits 16    Date for Recertification  01/12/24    Authorization Type [employer]    Progress Note Due on Visit 20    PT Start Time 1231    PT Stop Time 1315    PT Time Calculation (min) 44 min    Activity Tolerance Patient tolerated treatment well           Past Medical History:  Diagnosis Date   Cancer (HCC)    Family history of adverse reaction to anesthesia    per patient her dad had post n/v   Family history of bladder cancer    Hepatitis C    s/p treatment 2012   Hypertension    Hypothyroidism    Kidney stone    PONV (postoperative nausea and vomiting)    Past Surgical History:  Procedure Laterality Date   ABDOMINAL HYSTERECTOMY     BREAST BIOPSY Right 11/04/2023   US  RT BREAST BX W LOC DEV 1ST LESION IMG BX SPEC US  GUIDE 11/04/2023 [facility]   BREAST EXCISIONAL BIOPSY Bilateral    1981/1980   BREAST LUMPECTOMY Left    1998   BREAST LUMPECTOMY Right 12/14/2023   Procedure: BREAST LUMPECTOMY;  Surgeon: Aron Shoulders, MD;  Location: [facility] OR;  Service: General;  Laterality: Right;   BREAST SURGERY     augmentation   SENTINEL NODE BIOPSY Right 12/14/2023   Procedure: BIOPSY, LYMPH NODE, SENTINEL;  Surgeon: Aron Shoulders, MD;  Location: [facility] OR;  Service: General;  Laterality: Right;  SENTINEL LYMPH NODE BIOPSY   Patient Active Problem List   Diagnosis Date Noted   Genetic testing 11/28/2023   Family history of bladder cancer    Malignant neoplasm of upper-outer quadrant of right breast in female, estrogen receptor positive (HCC) 11/11/2023    PCP: Dyane Anthony RAMAN,  FNP   REFERRING PROVIDER:   Edna Toribio LABOR, MD    REFERRING DIAG: RIGHT PES BURSA/RIGHT HAMSTRING STRAIN   THERAPY DIAG:  Muscle weakness (generalized)  Pain in right leg  Rationale for Evaluation and Treatment: Rehabilitation  ONSET DATE: jan 2025  SUBJECTIVE:   SUBJECTIVE STATEMENT: I drove myself today.  Tender from breast surgery.  Will have 2nd surgery 10/16 since the margins weren't clear. I feel better than I did the other day.  My knee feels tight.    Last visit: I had my breast surgery on 12/14/23 and my doctor said I am clear to cont PT as long as we don't do much with my upper body. They already got my lab results. They didn't get clear margins so I have to have a second surgery on 12/29/23. I haven't been able to do as many of my exs since then. I have been stretching a lot but just can't walk much right now.  My Rt knee has been feeling a little better this week as I have had to rest more. I have noticed when I straighten my knee sometimes it doesn't feel like it wants to stop.     PERTINENT HISTORY: Malignant neoplasm of upper-outer quadrant of right  breast in female, estrogen receptor positive (HCC)  PAIN:  Are you having pain? Yes: NPRS scale: 3/10  Pain location: Rt medial knee to inferior to patella Pain description: dull achy (constant) Aggravating factors: walking, carrying 42 month old grand daughter, end of the day Relieving factors: rest, propping, ice, advil    PRECAUTIONS: Other: cancer pt  RED FLAGS: None   WEIGHT BEARING RESTRICTIONS: No  FALLS:  Has patient fallen in last 6 months? No  LIVING ENVIRONMENT: Lives with: lives alone Lives in: House/apartment Stairs: No Has following equipment at home: None  OCCUPATION: retired   PLOF: Independent  PATIENT GOALS: to have less pain to go back to walking (was doing 1-2 miles)  NEXT MD VISIT: nothing planned   OBJECTIVE:  Note: Objective measures were completed at Evaluation  unless otherwise noted.  DIAGNOSTIC FINDINGS: no imagining for hamstring or knee  PATIENT SURVEYS:  LEFS  Extreme difficulty/unable (0), Quite a bit of difficulty (1), Moderate difficulty (2), Little difficulty (3), No difficulty (4) Survey date:  11/17/23  Any of your usual work, housework or school activities 1  2. Usual hobbies, recreational or sporting activities 0  3. Getting into/out of the bath 1  4. Walking between rooms 1  5. Putting on socks/shoes 2  6. Squatting  1  7. Lifting an object, like a bag of groceries from the floor 1  8. Performing light activities around your home 1  9. Performing heavy activities around your home 0  10. Getting into/out of a car 1  11. Walking 2 blocks 0  12. Walking 1 mile 0  13. Going up/down 10 stairs (1 flight) 1  14. Standing for 1 hour 1  15.  sitting for 1 hour 4  16. Running on even ground 0  17. Running on uneven ground 0  18. Making sharp turns while running fast 0  19. Hopping  0  20. Rolling over in bed 3  Score total:  18     COGNITION: Overall cognitive status: Within functional limits for tasks assessed     SENSATION: WFL  EDEMA:  Circumferential: 49cm diameter (7cm below patella) Rt  44cm Lt same position  MUSCLE LENGTH: Rt hamstring limited by 25% with palpable tension at medial hamstring   POSTURE: posterior pelvic tilt  PALPATION: TTP at Rt medial knee joint line, medial hamstrings, medial fibers of quad, and distal adductor  LOWER EXTREMITY ROM:  Full ROM at knee ext and flexion without pain, Rt hip ER at end range at replicated pain but full range.   LOWER EXTREMITY MMT:  Bil hips grossly 4/5 except Rt hip flexion 3+/5  10/9: Bil 4+/5 hip flexion and extension, abduction 4/5  LOWER EXTREMITY SPECIAL TESTS:  Hip special tests: SI compression test: neg , SI distraction test: negative, Hip scouring test: negative, and Piriformis test: positive    FUNCTIONAL TESTS:  5 times sit to stand: 11s no AD  lowest table setting 2 minute walk test: 384' no AD  GAIT: Distance walked: TWO MINUTE WALK TEST *individual walks without assistance for 2 minutes and the distance is measured *start timing when the individual is instructed to "Go" *stop timing at 2 minutes *assistive devices can be used but should be kept consistent and  documented from test to test  *if physical assistance is required to walk, this should not be performed * a measuring wheel is helpful to determine distance walked *should be performed at the fastest speed possible   Gender  Age in years (# in study) Mean distance in meters Female  18-54 (260)   200.9 55-59 (23)   191.0 60-64 (29)   179.1 (587 feet) 65-69 (22)   184.2 70-74 (32)   172.2 75-79 (19)   157.6 80-85 (32)   144.1  Female 18-54 (539)   183.0 55-59 (30)   176.4 60-64 (48)   166.4 65-69 (22)   155.2 70-74 (33)   145.9 75-79 (14)   140.9 80-85 (34)   134.3  Assistive device utilized: None Level of assistance: Complete Independence Comments: trunk flexion, trunk lateral sway for limb advancement, increased step length at Rt with increased pain with attempts to shorten step, decreased cadence                                                                                                                                TREATMENT DATE:   12/22/23:  Nu-Step 12 min L5 Les only (therapist monitoring response and getting status update)  LAQ 7# right 40x  Quad isometrics multi angle into green ball 10x with 3 and 5 sec holds Seated HS red band 2x10 Standing red band TKE 15x with 5 sec holds Leg press seat 6 70# 20 x2; right/left 35# 20x  0/10 pain but reports general fatigue at the end of session  12/20/23: Therapeutic Exercises NuStep, LE 7, no UE's: Pt wanted to try briefly level 7 x 2 mins, then level 6 x 1 min, then finished last 4 mins at level 5; reminded pt not to push for prolonged periods of time as she is recovering from recent breast surgery and  stopped at 7 mins total as pt became very fatigued and SOB so had pt stop and encouraged deep breathing, she recovered fairly quickly. Rt TKE against blue ball on wall x 20 reps returning therapist demo LAQ 7#, 3 x 10 with Rt LE Lt S/L for Rt clam with red theraband above knees 4 x 10 Rt SLR 2# on ankle 2 x 10 Therapeutic Activities In // bars: Mini squats standing on purple airex 2 x 10 with brief rest between sets. Able to do with no UE support and VC's with demo for correct technique Standing on purple airex for bil heel raises 2 x 10 encouraging Lt hand fingertip support during for improved stability Bil leg press with less weight and VC's to ensure pt was relaxed in upper body which she reports having no trouble with: 60# x 30, pt reports this feeing reasonably easy   9/30: Discussed using a cane for walking longer distances: cane on left to offset right  Discussion use of floor bike for knee ROM  Nu-Step 10 min L4 Les only (therapist monitoring response and getting status update)  LAQ 7# right 30x (increase to 8#) Standing heel raises 10x left hand on wall  Modified single leg dead lift 4# to knee level (pulls a lot)  Leg press seat 6  next time) 75# 20 x2; right/left 35# 15x  Hip matrix 40# hip abduction and hip extension 15x right/ 5x left (more difficult with WB on right) SLR 2# 10x  Sidelying 2# hip abduction 10x Sidelying 2# hip adduction 10x Pain 2/10  9/25: Nu-Step 10 min L4 Les only (therapist monitoring response and getting status update) 117 spm average Sidelying clam with added green band (shortened band/tighter for added resistance) 10x right/left (verbal and tactile cues for technique)  Sit to stand  with green band around thighs to activate glutes 10x  (much improved technique) LAQ 7# right 10x 2 (feels heavy but not painful) Seated hip adduction with green band 2x10 Standing heel raises 10x, staggered Leg press seat 7 (move to seat 6 next time) 70# 20x, plus 10x  more after single legs per pt request; right/left 35# 15x  Hip matrix 40# hip abduction and hip extension 10x right/ 7x left (more difficult with WB on right)     PATIENT EDUCATION:  Education details: [email or web address] Person educated: Patient Education method: Explanation Education comprehension: verbalized understanding, returned demonstration, verbal cues required, tactile cues required, and needs further education  HOME EXERCISE PROGRAM: Access Code: [email or web address] URL: [email or web address] Date: 11/22/2023 Prepared by: Glade Pesa  Exercises - Seated Table Hamstring Stretch  - 1 x daily - 7 x weekly - 1 sets - 3 reps - 30s holds - Supine Piriformis Stretch  - 1 x daily - 7 x weekly - 1 sets - 3 reps - 30s holds - Standing Gastroc Stretch on Step with Counter Support  - 1 x daily - 7 x weekly - 1 sets - 3 reps - 30s hold - Supine Hip Internal and External Rotation  - 1 x daily - 7 x weekly - 1 sets - 10 reps - 5s holds - Clamshell  - 1 x daily - 7 x weekly - 1 sets - 10 reps - Prone Gluteal Sets  - 1 x daily - 7 x weekly - 1 sets - 10 reps - Sit to Stand  - 1 x daily - 7 x weekly - 1 sets - 10 reps - Gastroc Stretch on Wall  - 1 x daily - 7 x weekly - 3 reps - 30 hold  ASSESSMENT:  CLINICAL IMPRESSION: Esabella continues to recover from her recent breast surgery.  Treatment modified to avoid UE use.  Her pain level in her knee/leg is low, reporting mostly tightness.  Therapist monitoring response to all interventions and modifying treatment accordingly.  She continues to show progress but status is variable secondary to current treatment for breast cancer and may need a slower progression.    OBJECTIVE IMPAIRMENTS: Abnormal gait, decreased activity tolerance, decreased balance, decreased coordination, decreased endurance, decreased mobility, difficulty walking, decreased ROM, decreased strength, increased fascial restrictions, impaired perceived functional ability, increased  muscle spasms, impaired flexibility, improper body mechanics, postural dysfunction, and pain.   ACTIVITY LIMITATIONS: carrying, lifting, bending, standing, squatting, stairs, transfers, and locomotion level  PARTICIPATION LIMITATIONS: meal prep, cleaning, laundry, community activity, and yard work  PERSONAL FACTORS: Fitness, Past/current experiences, Time since onset of injury/illness/exacerbation, and 1 comorbidity: medical history are also affecting patient's functional outcome.   REHAB POTENTIAL: Good  CLINICAL DECISION MAKING: Stable/uncomplicated  EVALUATION COMPLEXITY: Low   GOALS: Goals reviewed with patient? Yes  SHORT TERM GOALS: Target date: 12/15/23 Pt to be I with HEP for carry over and continuing recommendations for improved outcomes.   Baseline: Goal status: met 9/30  2.  Pt to  demonstrate full ROM of Rt hip without pain for improved gait mechanics to tolerate return to PLOF for walking.  Baseline:  Goal status: partially met   3.  Pt to demonstrate x10 sit to stands without pain or compensation for improved hip strength for walking. Baseline:  Goal status: met 9/30  4.  Pt to demonstrate 5/5 bil hip strength without pain for improved ability to transfer from ground to standing to return to gardening Baseline:  Goal status: partially met, knee pain affects transfers   LONG TERM GOALS: Target date: 01/12/24  Pt to be I with advanced HEP for carry over and continuing recommendations for improved outcomes.   Baseline:  Goal status: INITIAL  2.  Pt to report ability to tolerate walking at least 1 mile with no more than 1/10 pain for improved QOL.  Baseline:  Goal status: INITIAL  3.  Pt to demonstrate improved within age related norms due to improved cadence and mechanics of gait for improved ability to complete grocery errands.  Baseline:  Goal status: INITIAL  4.  Pt to demonstrate improved 5xSTS to no more than 7s for decreased fall risk.  Baseline:   Goal status: INITIAL    PLAN:  PT FREQUENCY: 2x/week  PT DURATION: 8 weeks  PLANNED INTERVENTIONS: 97110-Therapeutic exercises, 97530- Therapeutic activity, [id number]- Neuromuscular re-education, 97535- Self Care, [id number]- Manual therapy, [id number]- Gait training, [phone]- Canalith repositioning, [id number]- Aquatic Therapy, [phone]- Electrical stimulation (manual), [id number]- Vasopneumatic device, [id number]- Traction (mechanical), [id number]- Ionotophoresis 4mg /ml Dexamethasone, 20560 (1-2 muscles), 20561 (3+ muscles)- Dry Needling, Patient/Family education, Taping, Joint mobilization, Spinal mobilization, Scar mobilization, DME instructions, Cryotherapy, Moist heat, and Biofeedback  PLAN FOR NEXT SESSION: LEFS; Cont Rt LE strength with no UE involvement and cuing to keep upper body relaxed with all activities; Change appts accordingly when we hear back from Dr. Aron; Cont gluteal activation ex's; Nu-Step Les only; stretch hamstrings, adductors, hip strengthening, standing balance progressing pt as pain tolerance allows.   Glade Pesa, PT 12/22/23 9:37 PM Phone: [phone] Fax: [phone]  [facility] [address] [place], [place] [address] Phone # [phone] Fax [phone]

## 2023-12-27 ENCOUNTER — Encounter (HOSPITAL_COMMUNITY): Payer: Self-pay | Admitting: General Surgery

## 2023-12-27 ENCOUNTER — Encounter: Admitting: Physical Therapy

## 2023-12-28 ENCOUNTER — Encounter (HOSPITAL_COMMUNITY): Payer: Self-pay | Admitting: General Surgery

## 2023-12-28 ENCOUNTER — Other Ambulatory Visit: Payer: Self-pay

## 2023-12-28 ENCOUNTER — Encounter (HOSPITAL_COMMUNITY): Payer: Self-pay

## 2023-12-28 NOTE — Progress Notes (Signed)
 PCP - Anthony Hint, NP Cardiologist - none Oncology - Dr Amber Iruku  Chest x-ray - n/a EKG - 12/12/23 Stress Test - n/a ECHO - n/a Cardiac Cath - n/a  ICD Pacemaker/Loop - n/a  Sleep Study -  n/a  Diabetes - n/a  Aspirin & Blood Thinner Instructions:  n/a  ERAS - clear liquids til 5:45 AM DOS.  Anesthesia review: Yes  STOP now taking any Aspirin (unless otherwise instructed by your surgeon), Aleve, Naproxen, Ibuprofen, Motrin, Advil, Goody's, BC's, all herbal medications, fish oil, and all vitamins.   Coronavirus Screening Do you have any of the following symptoms:  Cough yes/no: No Fever (>100.35F)  yes/no: No Runny nose yes/no: No Sore throat yes/no: No Difficulty breathing/shortness of breath  yes/no: No  Have you traveled in the last 14 days and where? yes/no: No  Patient verbalized understanding of instructions that were given via phone.

## 2023-12-28 NOTE — H&P (Signed)
 REFERRING PHYSICIAN: Arceo  PROVIDER: JINA CLAIR NEPHEW, MD  Care Team: Patient Care Team: Dyane Dries, NP as PCP - General (Internal Medicine) NEPHEW JINA CLAIR, MD as Consulting Provider (Surgical Oncology) Dewey Norleen Hamilton, MD as Referring Physician (Radiation Oncology) Iruku, Linnette Lane Dice, MD as Referring Physician (Hematology and Oncology)   MRN: [id number] DOB: Apr 29, 1955  Subjective   Chief Complaint: Breast Cancer  History of Present Illness: Amanda Herring is a 68 y.o. female who is seen today as an office consultation at the request of Dr. Loretha for evaluation of Breast Cancer .   Patient is a 68 year old female with a new diagnosis of right breast cancer August 2025. Patient presented with a palpable right breast mass. She had a previously palpable right breast cyst that had been there for a while. However, she noted that it had become much more firm instead of  squishy. She underwent diagnostic imaging which showed a 1.9 cm mass in the retroareolar region. 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 Patient has breast density B. Axilla was negative.  Patient had prior benign breast masses removed from the right. She had left breast in situ cancer in 1998. She also had bilateral breast implants placed by Dr. Emery.  Family cancer history -only skin cancers have run in her family. Menarche - 23 Menopause -age 42 Parity - 9, first age 89  Work -retired, but takes care of her 50-month-old granddaughter. Up-to-date with colonoscopy, last around 2023.  Diagnostic mammogram/us  11/04/23 [facility] ACR Breast Density Category b: There are scattered areas of fibroglandular density. FINDINGS: There is a partly obscured oval mass in the retroareolar right breast corresponding to the palpable lump, measuring approximately 1.2 cm in size. There is a small stable mass in the  lateral retroareolar left breast. There no other defined masses, no areas of architectural distortion and no suspicious calcifications. The patient has retropectoral saline implants. On physical exam, there is a firm superficial palpable mass in the 12 o'clock position of the right breast. Targeted ultrasound is performed, showing a cystic and solid mass, predominantly solid, hypoechoic and superficial in the 12 o'clock position of the retroareolar right breast, corresponding to the lump. This measures 1.9 x 1.4 x 1.7 cm. There is internal blood flow within the solid components on color Doppler analysis. Margins are mostly ill-defined. Sonographic imaging of the right axilla demonstrates normal lymph nodes. No enlarged or abnormal nodes. IMPRESSION: 1. Suspicious 1.9 cm solid and cystic mass in the 12 o'clock retroareolar right breast. Tissue sampling is indicated. RECOMMENDATION: 1. Ultrasound-guided core needle biopsy of the cystic and solid mass in the 12 o'clock retroareolar right breast. This procedure was scheduled prior to the [facility]. I have discussed the findings and recommendations with the patient. If applicable, a reminder letter will be sent to the patient regarding the next appointment. BI-RADS CATEGORY 4: Suspicious.   Pathology core needle biopsy: 11/04/23 1. Breast, right, needle core biopsy, 12 o'clock :  INVASIVE MAMMARY CARCINOMA, SEE NOTE  TUBULE FORMATION: SCORE 3  NUCLEAR PLEOMORPHISM: SCORE 2  MITOTIC COUNT: SCORE 1  TOTAL SCORE: 6  OVERALL GRADE: 2  LYMPHOVASCULAR INVASION: NOT IDENTIFIED  CANCER LENGTH: 1.1 CM  CALCIFICATIONS: NOT IDENTIFIED  OTHER FINDINGS: NONE   Receptors: Estrogen Receptor: 100%, POSITIVE, STRONG STAINING INTENSITY  Progesterone Receptor: 10%, POSITIVE, WEAK STAINING INTENSITY  Proliferation Marker Ki67: 1%  GROUP 5: HER2 **  NEGATIVE**   Review of Systems: A complete review of systems was obtained  from the patient. I have reviewed this information and discussed as appropriate with the patient. See HPI as well for other ROS. ROS -otherwise negative.  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  Gastric diverticulum  History of cancer  Thyroid  disease   Patient Active Problem List  Diagnosis  Chronic hepatitis C without mention of hepatic coma (CMS/HHS-HCC)  Malignant neoplasm of upper-outer quadrant of right breast in female, estrogen receptor positive (CMS/HHS-HCC)  H/O bilateral breast implants   Past Surgical History:  Procedure Laterality Date  TAH-BSO 02/13/2007  LIVER BIOPSY 06/13/2008  Stage 2  breast lumpectomy  HYSTERECTOMY    Allergies  Allergen Reactions  Codeine Vomiting and Other (See Comments)  Other Reaction: GI Upset   Current Outpatient Medications on File Prior to Visit  Medication Sig Dispense Refill  ANTIOX #11/OM3/DHA/EPA/LUT/ZEA (OCUVITE ADULT 50+ ORAL) Take 1 tablet by mouth daily.  b complex vitamins capsule Take 1 capsule by mouth once daily  CALCIUM ORAL Take 1 tablet by mouth daily.  cetirizine (ZYRTEC) 10 mg capsule 1 tab by mouth daily  cholecalciferol (VITAMIN D3) 2,000 unit capsule 1 tab by mouth daily  estradiol (VAGIFEM) 10 mcg vaginal tablet Place 10 mcg vaginally twice a week.  hydroCHLOROthiazide (HYDRODIURIL) 25 MG tablet TAKE 1/2 TABLET BY MOUTH DAILY IN THE MORNING  ibuprofen (ADVIL,MOTRIN) 200 MG tablet 2 tab by mouth every 4 hours as needed  levothyroxine (SYNTHROID) 25 MCG tablet Take 25 mcg by mouth  multivitamin capsule 1 cap by mouth daily  ranitidine (ZANTAC) 150 MG tablet 1 tab by mouth 2 times a day as needed  zolpidem (AMBIEN) 10 mg tablet 4 Times a week  BIOTIN ORAL Take 1 tablet by mouth daily.  citalopram (CELEXA) 20 MG tablet 1/2 tab once daily until instructed to increase to whole tablet. 6  FERROUS FUMARATE/VIT  BCOMP&C (SUPER B COMPLEX ORAL) Take 1 tablet by mouth daily.  GLUCOSAMINE HCL/CHONDR SU A NA (OSTEO BI-FLEX ORAL) Take 1 tablet by mouth daily.   No current facility-administered medications on file prior to visit.   Family History  Problem Relation Age of Onset  Skin cancer Father  Stroke Father  High blood pressure (Hypertension) Father  Myocardial Infarction (Heart attack) Father  Myocardial Infarction (Heart attack) Brother  High blood pressure (Hypertension) Brother  Skin cancer Brother    Social History   Tobacco Use  Smoking Status Never  Smokeless Tobacco Never    Social History   Socioeconomic History  Marital status: Single  Tobacco Use  Smoking status: Never  Smokeless tobacco: Never   Objective:   Vitals:  BP: 126/72  Pulse: 68  Resp: 17  Temp: 36.3 C (97.3 F)  Weight: (!) 102.8 kg (226 lb 11.2 oz)  Height: 162.6 cm (5' 4)   Body mass index is 38.91 kg/m.  Gen: No acute distress. Well nourished and well groomed.  Neurological: Alert and oriented to person, place, and time. Coordination normal.  Head: Normocephalic and atraumatic.  Eyes: Conjunctivae are normal. Pupils are equal, round, and reactive to light. No scleral icterus.  Neck: Normal range of motion. Neck supple. No tracheal deviation or thyromegaly present.  Cardiovascular: Normal rate, regular rhythm, normal heart sounds and intact distal pulses. Exam reveals no gallop and no friction rub. No murmur heard. Breast: Breasts are relatively symmetric. No  seroma around implants appreciated clinically. 1 to 1-1/2 cm mass at the superior aspect of the areola on the right. This is separate from the skin. It is mobile. It is firm. There is no nipple retraction or contour defect. There is no nipple discharge. There is some bruising at the biopsy site. No lymphadenopathy is present. There is no other palpable masses. The breast tissue overlying the implants is heterogeneously dense. Left breast is  benign. Respiratory: Effort normal. No respiratory distress. No chest wall tenderness. Breath sounds normal. No wheezes, rales or rhonchi.  GI: Soft. Bowel sounds are normal. The abdomen is soft and nontender. There is no rebound and no guarding.  Musculoskeletal: Normal range of motion. Extremities are nontender.  Lymphadenopathy: No cervical, preauricular, postauricular or axillary adenopathy is present Skin: Skin is warm and dry. No rash noted. No diaphoresis. No erythema. No pallor. No clubbing, cyanosis, or edema.  Psychiatric: Normal mood and affect. Behavior is normal. Judgment and thought content normal.   Labs pending  Assessment and Plan:   ICD-10-CM  1. Malignant neoplasm of upper-outer quadrant of right breast in female, estrogen receptor positive (CMS/HHS-HCC) C50.411  Z17.0   2. H/O bilateral breast implants Z98.82    Patient has a new diagnosis of clinical T1c N0 hormone positive right breast cancer. Because this is lobular, we will obtain an MRI to assess for any occult breast cancer or any evidence of a larger than appreciated breast cancer. Should this be all that we see, we will proceed with a lumpectomy and sentinel lymph node biopsy. Because this is easily palpable, I do not think we need to place a seed. Will then proceed with treatment. This will be followed by oncotype, radiation, and antihormonal treatment.  I reviewed surgery with the patient and her daughter. We discussed postoperative restrictions.  The surgical procedure was described to the patient. I discussed the incision type and location.   We discussed the risks bleeding, infection, damage to other structures, need for further procedures/surgeries. We discussed the risk of seroma. The patient was advised if the breast has cancer, we may need to go back to surgery for additional tissue to obtain negative margins or for a lymph node biopsy. The patient was advised that these are the most common  complications, but that others can occur as well. I discussed the risk of alteration in breast contour or size. I discussed risk of chronic pain. There are rare instances of heart/lung issues post op as well as blood clots.   They were advised against taking aspirin or other anti-inflammatory agents/blood thinners the week before surgery.   The risks and benefits of the procedure were described to the patient and she wishes to proceed.

## 2023-12-29 ENCOUNTER — Ambulatory Visit (HOSPITAL_COMMUNITY)
Admission: RE | Admit: 2023-12-29 | Discharge: 2023-12-29 | Disposition: A | Discharge status: Home / Self Care | Attending: General Surgery | Admitting: General Surgery

## 2023-12-29 ENCOUNTER — Ambulatory Visit (HOSPITAL_COMMUNITY): Payer: Self-pay | Admitting: Vascular Surgery

## 2023-12-29 ENCOUNTER — Encounter (HOSPITAL_COMMUNITY)
Admission: RE | Disposition: A | Discharge status: Home / Self Care | Payer: Self-pay | Source: Home / Self Care | Attending: General Surgery

## 2023-12-29 ENCOUNTER — Encounter (HOSPITAL_COMMUNITY): Payer: Self-pay | Admitting: General Surgery

## 2023-12-29 ENCOUNTER — Ambulatory Visit (HOSPITAL_BASED_OUTPATIENT_CLINIC_OR_DEPARTMENT_OTHER): Payer: Self-pay | Admitting: Vascular Surgery

## 2023-12-29 ENCOUNTER — Ambulatory Visit: Admitting: Physical Therapy

## 2023-12-29 DIAGNOSIS — Z170 Estrogen receptor positive status [ER+]: Secondary | ICD-10-CM | POA: Diagnosis not present

## 2023-12-29 DIAGNOSIS — I10 Essential (primary) hypertension: Secondary | ICD-10-CM | POA: Insufficient documentation

## 2023-12-29 DIAGNOSIS — Z1732 Human epidermal growth factor receptor 2 negative status: Secondary | ICD-10-CM | POA: Insufficient documentation

## 2023-12-29 DIAGNOSIS — Z808 Family history of malignant neoplasm of other organs or systems: Secondary | ICD-10-CM | POA: Insufficient documentation

## 2023-12-29 DIAGNOSIS — Z9882 Breast implant status: Secondary | ICD-10-CM | POA: Insufficient documentation

## 2023-12-29 DIAGNOSIS — Z780 Asymptomatic menopausal state: Secondary | ICD-10-CM | POA: Insufficient documentation

## 2023-12-29 DIAGNOSIS — C50911 Malignant neoplasm of unspecified site of right female breast: Secondary | ICD-10-CM

## 2023-12-29 DIAGNOSIS — E039 Hypothyroidism, unspecified: Secondary | ICD-10-CM

## 2023-12-29 DIAGNOSIS — K219 Gastro-esophageal reflux disease without esophagitis: Secondary | ICD-10-CM | POA: Diagnosis not present

## 2023-12-29 DIAGNOSIS — C50411 Malignant neoplasm of upper-outer quadrant of right female breast: Secondary | ICD-10-CM | POA: Diagnosis present

## 2023-12-29 HISTORY — DX: Personal history of urinary calculi: Z87.442

## 2023-12-29 HISTORY — DX: Gastro-esophageal reflux disease without esophagitis: K21.9

## 2023-12-29 HISTORY — DX: Pneumonia, unspecified organism: J18.9

## 2023-12-29 HISTORY — PX: RE-EXCISION OF BREAST LUMPECTOMY: SHX6048

## 2023-12-29 LAB — CBC
HCT: 40.6 % (ref 36.0–46.0)
Hemoglobin: 13.7 g/dL (ref 12.0–15.0)
MCH: 31.4 pg (ref 26.0–34.0)
MCHC: 33.7 g/dL (ref 30.0–36.0)
MCV: 92.9 fL (ref 80.0–100.0)
Platelets: 197 K/uL (ref 150–400)
RBC: 4.37 MIL/uL (ref 3.87–5.11)
RDW: 13.2 % (ref 11.5–15.5)
WBC: 5.5 K/uL (ref 4.0–10.5)
nRBC: 0 % (ref 0.0–0.2)

## 2023-12-29 LAB — COMPREHENSIVE METABOLIC PANEL WITH GFR
ALT: 18 U/L (ref 0–44)
AST: 24 U/L (ref 15–41)
Albumin: 3.7 g/dL (ref 3.5–5.0)
Alkaline Phosphatase: 41 U/L (ref 38–126)
Anion gap: 12 (ref 5–15)
BUN: 20 mg/dL (ref 8–23)
CO2: 22 mmol/L (ref 22–32)
Calcium: 8.9 mg/dL (ref 8.9–10.3)
Chloride: 104 mmol/L (ref 98–111)
Creatinine, Ser: 0.6 mg/dL (ref 0.44–1.00)
GFR, Estimated: >60 mL/min (ref >60)
Glucose, Bld: 97 mg/dL (ref 70–99)
Potassium: 3.5 mmol/L (ref 3.5–5.1)
Sodium: 138 mmol/L (ref 135–145)
Total Bilirubin: 1.2 mg/dL (ref 0.0–1.2)
Total Protein: 6.3 g/dL — ABNORMAL LOW (ref 6.5–8.1)

## 2023-12-29 SURGERY — EXCISION, LESION, BREAST
Anesthesia: General | Site: Breast | Laterality: Right

## 2023-12-29 MED ORDER — FENTANYL CITRATE (PF) 100 MCG/2ML IJ SOLN
25.0000 ug | INTRAMUSCULAR | Status: DC | PRN
  Administered 2023-12-29 (×2): 50 ug via INTRAVENOUS

## 2023-12-29 MED ORDER — ONDANSETRON HCL 4 MG/2ML IJ SOLN
INTRAMUSCULAR | Status: DC | PRN
  Administered 2023-12-29: 4 mg via INTRAVENOUS

## 2023-12-29 MED ORDER — ONDANSETRON HCL 4 MG/2ML IJ SOLN: 4.0000 mg | Freq: Once | INTRAMUSCULAR | Status: DC | PRN

## 2023-12-29 MED ORDER — OXYCODONE HCL 5 MG PO TABS
5.0000 mg | ORAL_TABLET | Freq: Once | ORAL | Status: AC | PRN
  Administered 2023-12-29: 5 mg via ORAL

## 2023-12-29 MED ORDER — OXYCODONE HCL 5 MG PO TABS: 5.0000 mg | ORAL_TABLET | Freq: Four times a day (QID) | ORAL | 0 refills | Status: AC | PRN

## 2023-12-29 MED ORDER — CHLORHEXIDINE GLUCONATE CLOTH 2 % EX PADS
6.0000 | MEDICATED_PAD | Freq: Once | CUTANEOUS | Status: AC
  Administered 2023-12-29: 6 via TOPICAL

## 2023-12-29 MED ORDER — MIDAZOLAM HCL 2 MG/2ML IJ SOLN
INTRAMUSCULAR | Status: AC
  Filled 2023-12-29: qty 2

## 2023-12-29 MED ORDER — PROPOFOL 10 MG/ML IV BOLUS
INTRAVENOUS | Status: DC | PRN
  Administered 2023-12-29: 50 mg via INTRAVENOUS
  Administered 2023-12-29: 100 mg via INTRAVENOUS
  Administered 2023-12-29 (×3): 50 mg via INTRAVENOUS

## 2023-12-29 MED ORDER — CHLORHEXIDINE GLUCONATE 0.12 % MT SOLN
OROMUCOSAL | Status: AC
  Administered 2023-12-29: 15 mL via OROMUCOSAL
  Filled 2023-12-29: qty 15

## 2023-12-29 MED ORDER — PROPOFOL 10 MG/ML IV BOLUS
INTRAVENOUS | Status: AC
  Filled 2023-12-29: qty 20

## 2023-12-29 MED ORDER — BUPIVACAINE-EPINEPHRINE (PF) 0.25% -1:200000 IJ SOLN
INTRAMUSCULAR | Status: AC
Start: 2023-12-29 — End: 2023-12-29
  Filled 2023-12-29: qty 30

## 2023-12-29 MED ORDER — ACETAMINOPHEN 500 MG PO TABS
1000.0000 mg | ORAL_TABLET | ORAL | Status: AC
  Administered 2023-12-29: 1000 mg via ORAL
  Filled 2023-12-29: qty 2

## 2023-12-29 MED ORDER — PROPOFOL 1000 MG/100ML IV EMUL
INTRAVENOUS | Status: AC
  Filled 2023-12-29: qty 100

## 2023-12-29 MED ORDER — LIDOCAINE 2% (20 MG/ML) 5 ML SYRINGE
INTRAMUSCULAR | Status: DC | PRN
  Administered 2023-12-29: 80 mg via INTRAVENOUS

## 2023-12-29 MED ORDER — DEXAMETHASONE SOD PHOSPHATE PF 10 MG/ML IJ SOLN
INTRAMUSCULAR | Status: DC | PRN
  Administered 2023-12-29: 10 mg via INTRAVENOUS

## 2023-12-29 MED ORDER — PHENYLEPHRINE HCL-NACL 20-0.9 MG/250ML-% IV SOLN
INTRAVENOUS | Status: DC | PRN
  Administered 2023-12-29: 30 ug/min via INTRAVENOUS

## 2023-12-29 MED ORDER — LACTATED RINGERS IV SOLN: INTRAVENOUS | Status: DC

## 2023-12-29 MED ORDER — ONDANSETRON HCL 4 MG/2ML IJ SOLN
INTRAMUSCULAR | Status: AC
  Filled 2023-12-29: qty 2

## 2023-12-29 MED ORDER — CEFAZOLIN SODIUM-DEXTROSE 2-4 GM/100ML-% IV SOLN
2.0000 g | INTRAVENOUS | Status: AC
Start: 2023-12-29 — End: 2023-12-29
  Administered 2023-12-29: 2 g via INTRAVENOUS
  Filled 2023-12-29: qty 100

## 2023-12-29 MED ORDER — LIDOCAINE 2% (20 MG/ML) 5 ML SYRINGE
INTRAMUSCULAR | Status: AC
  Filled 2023-12-29: qty 5

## 2023-12-29 MED ORDER — FENTANYL CITRATE (PF) 100 MCG/2ML IJ SOLN
INTRAMUSCULAR | Status: AC
  Filled 2023-12-29: qty 2

## 2023-12-29 MED ORDER — PROPOFOL 500 MG/50ML IV EMUL
INTRAVENOUS | Status: DC | PRN
  Administered 2023-12-29: 150 ug/kg/min via INTRAVENOUS

## 2023-12-29 MED ORDER — CHLORHEXIDINE GLUCONATE CLOTH 2 % EX PADS: 6.0000 | MEDICATED_PAD | Freq: Once | CUTANEOUS | Status: DC

## 2023-12-29 MED ORDER — OXYCODONE HCL 5 MG/5ML PO SOLN: 5.0000 mg | Freq: Once | ORAL | Status: AC | PRN

## 2023-12-29 MED ORDER — CHLORHEXIDINE GLUCONATE 0.12 % MT SOLN: 15.0000 mL | Freq: Once | OROMUCOSAL | Status: AC

## 2023-12-29 MED ORDER — FENTANYL CITRATE (PF) 250 MCG/5ML IJ SOLN
INTRAMUSCULAR | Status: DC | PRN
  Administered 2023-12-29 (×4): 25 ug via INTRAVENOUS

## 2023-12-29 MED ORDER — DROPERIDOL 2.5 MG/ML IJ SOLN: 0.6250 mg | Freq: Once | INTRAMUSCULAR | Status: DC | PRN

## 2023-12-29 MED ORDER — ORAL CARE MOUTH RINSE
15.0000 mL | Freq: Once | OROMUCOSAL | Status: AC
Start: 2023-12-29 — End: 2023-12-29

## 2023-12-29 MED ORDER — LIDOCAINE HCL (PF) 1 % IJ SOLN
INTRAMUSCULAR | Status: AC
  Filled 2023-12-29: qty 30

## 2023-12-29 MED ORDER — GLYCOPYRROLATE PF 0.2 MG/ML IJ SOSY
PREFILLED_SYRINGE | INTRAMUSCULAR | Status: DC | PRN
  Administered 2023-12-29: .2 mg via INTRAVENOUS

## 2023-12-29 MED ORDER — ACETAMINOPHEN 10 MG/ML IV SOLN: 1000.0000 mg | Freq: Once | INTRAVENOUS | Status: DC | PRN

## 2023-12-29 MED ORDER — 0.9 % SODIUM CHLORIDE (POUR BTL) OPTIME
TOPICAL | Status: DC | PRN
  Administered 2023-12-29: 1000 mL

## 2023-12-29 MED ORDER — LIDOCAINE HCL 1 % IJ SOLN
INTRAMUSCULAR | Status: DC | PRN
  Administered 2023-12-29: 50 mL via INTRAMUSCULAR

## 2023-12-29 MED ORDER — MIDAZOLAM HCL 2 MG/2ML IJ SOLN
INTRAMUSCULAR | Status: DC | PRN
  Administered 2023-12-29: 2 mg via INTRAVENOUS

## 2023-12-29 MED ORDER — OXYCODONE HCL 5 MG PO TABS
ORAL_TABLET | ORAL | Status: AC
  Filled 2023-12-29: qty 1

## 2023-12-29 SURGICAL SUPPLY — 26 items
CANISTER SUCTION 3000ML PPV (SUCTIONS) IMPLANT
CHLORAPREP W/TINT 26 (MISCELLANEOUS) ×2 IMPLANT
CLIP TI LARGE 6 (CLIP) ×2 IMPLANT
COVER SURGICAL LIGHT HANDLE (MISCELLANEOUS) ×2 IMPLANT
DERMABOND ADVANCED .7 DNX12 (GAUZE/BANDAGES/DRESSINGS) ×2 IMPLANT
DRAPE CHEST BREAST 15X10 FENES (DRAPES) ×2 IMPLANT
ELECT COATED BLADE 2.86 ST (ELECTRODE) ×2 IMPLANT
ELECTRODE REM PT RTRN 9FT ADLT (ELECTROSURGICAL) ×2 IMPLANT
GLOVE BIO SURGEON STRL SZ 6 (GLOVE) ×2 IMPLANT
GLOVE INDICATOR 6.5 STRL GRN (GLOVE) ×2 IMPLANT
GOWN STRL REUS W/ TWL LRG LVL3 (GOWN DISPOSABLE) ×2 IMPLANT
GOWN STRL REUS W/ TWL XL LVL3 (GOWN DISPOSABLE) ×2 IMPLANT
KIT BASIN OR (CUSTOM PROCEDURE TRAY) ×2 IMPLANT
KIT MARKER MARGIN INK (KITS) ×2 IMPLANT
NDL HYPO 25GX1X1/2 BEV (NEEDLE) ×2 IMPLANT
NEEDLE HYPO 25GX1X1/2 BEV (NEEDLE) ×1 IMPLANT
PACK GENERAL/GYN (CUSTOM PROCEDURE TRAY) ×2 IMPLANT
SOLN 0.9% NACL 1000 ML (IV SOLUTION) ×1 IMPLANT
SOLN 0.9% NACL POUR BTL 1000ML (IV SOLUTION) IMPLANT
STRIP CLOSURE SKIN 1/2X4 (GAUZE/BANDAGES/DRESSINGS) ×2 IMPLANT
SUT MNCRL AB 4-0 PS2 18 (SUTURE) ×2 IMPLANT
SUT SILK 2 0 SH (SUTURE) IMPLANT
SUT VIC AB 2-0 SH 27XBRD (SUTURE) IMPLANT
SUT VIC AB 3-0 SH 27X BRD (SUTURE) ×2 IMPLANT
SYR CONTROL 10ML LL (SYRINGE) ×2 IMPLANT
TOWEL GREEN STERILE (TOWEL DISPOSABLE) ×2 IMPLANT

## 2023-12-29 NOTE — Anesthesia Postprocedure Evaluation (Signed)
 Anesthesia Post Note  Patient: Amanda Herring  Procedure(s) Performed: EXCISION, LESION, BREAST (Right: Breast)     Patient location during evaluation: PACU Anesthesia Type: General Level of consciousness: awake Pain management: pain level controlled Vital Signs Assessment: post-procedure vital signs reviewed and stable Respiratory status: spontaneous breathing Cardiovascular status: blood pressure returned to baseline Postop Assessment: no apparent nausea or vomiting Anesthetic complications: no   No notable events documented.  Last Vitals:  Vitals:   12/29/23 1130 12/29/23 1137  BP: 115/65 127/68  Pulse: 73 74  Resp: 20 16  Temp:  (!) 36.4 C  SpO2: 93% 96%    Last Pain:  Vitals:   12/29/23 1137  TempSrc:   PainSc: 6                  Lauraine KATHEE [place]

## 2023-12-29 NOTE — Anesthesia Preprocedure Evaluation (Addendum)
 Anesthesia Evaluation  Patient identified by MRN, date of birth, ID band Patient awake    Reviewed: Allergy & Precautions, NPO status , Patient's Chart, lab work & pertinent test results  History of Anesthesia Complications (+) PONV and history of anesthetic complications  Airway Mallampati: II       Dental  (+) Teeth Intact, Dental Advisory Given   Pulmonary    breath sounds clear to auscultation       Cardiovascular hypertension,  Rhythm:Regular Rate:Normal     Neuro/Psych    GI/Hepatic ,GERD  ,,(+) Hepatitis -, C  Endo/Other  Hypothyroidism    Renal/GU      Musculoskeletal   Abdominal   Peds  Hematology   Anesthesia Other Findings Breast Cancer  Reproductive/Obstetrics                              Anesthesia Physical Anesthesia Plan  ASA: 3  Anesthesia Plan: General   Post-op Pain Management:    Induction: Intravenous  PONV Risk Score and Plan: 2 and Ondansetron , Treatment may vary due to age or medical condition and TIVA  Airway Management Planned: LMA and Oral ETT  Additional Equipment:   Intra-op Plan:   Post-operative Plan: Extubation in OR  Informed Consent:      Dental advisory given  Plan Discussed with: CRNA and Surgeon  Anesthesia Plan Comments:          Anesthesia Quick Evaluation

## 2023-12-29 NOTE — Op Note (Signed)
 Re-excisional Right Breast Lumpectomy   Indications: This patient presents with history of focally positive margins after lumpectomy for right breast cancer   Pre-operative Diagnosis: right breast cancer   Post-operative Diagnosis: right breast cancer   Surgeon: ARON SHOULDERS   Assistants: Waddell Collier RNFA   Anesthesia: General anesthesia and Local anesthesia   ASA Class: 3   Procedure Details  The patient was seen in the Holding Room. The risks, benefits, complications, treatment options, and expected outcomes were discussed with the patient. The possibilities of reaction to medication, pulmonary aspiration, bleeding, infection, the need for additional procedures, failure to diagnose a condition, and creating a complication requiring transfusion or operation were discussed with the patient. The patient concurred with the proposed plan, giving informed consent. The site of surgery properly noted/marked. The patient was taken to Operating Room # 2, identified, and the procedure verified as re-excision of right breast cancer.  After induction of anesthesia, the right breast and chest were prepped and draped in standard fashion.  The lumpectomy was performed by reopening the prior incision. Seroma was aspirated. The mastopexy sutures were removed. Additional margins were taken at the anterior and posterior borders of the partial mastectomy cavity. The anterior border was taken sharply with metzenbaum scissors and was very close to the posterior areola.  The posterior margin was pectoralis which was taken.   Hemostasis was achieved with cautery. The wound was irrigated.  The deeper tissues were pulled together with 2-0 vicryl.  The skin was then reapproximated with a 3-0 Vicryl deep dermal interrupted and a 4-0 Monocryl subcuticular closure in layers. Sterile dressings were applied. At the end of the operation, all sponge, instrument, and needle counts were correct.   Findings:  grossly clear  surgical margins, posterior margin is implant capsule.  Anterior margin is areola  Estimated Blood Loss: Minimal   Drains: none   Specimens: additional anterior and posterior margins.   Complications: None; patient tolerated the procedure well.   Disposition: PACU - hemodynamically stable.   Condition: stable

## 2023-12-29 NOTE — Anesthesia Procedure Notes (Signed)
 Procedure Name: LMA Insertion Date/Time: 12/29/2023 9:20 AM  Performed by: Jolynn Mage, CRNAPre-anesthesia Checklist: Patient identified, Emergency Drugs available, Suction available and Patient being monitored Patient Re-evaluated:Patient Re-evaluated prior to induction Oxygen Delivery Method: Circle system utilized Preoxygenation: Pre-oxygenation with 100% oxygen Induction Type: IV induction Ventilation: Mask ventilation without difficulty LMA: LMA flexible inserted LMA Size: 4.0 Number of attempts: 1 Placement Confirmation: positive ETCO2 and breath sounds checked- equal and bilateral Tube secured with: Tape Dental Injury: Teeth and Oropharynx as per pre-operative assessment

## 2023-12-29 NOTE — Transfer of Care (Signed)
 Immediate Anesthesia Transfer of Care Note  Patient: Amanda Herring  Procedure(s) Performed: EXCISION, LESION, BREAST (Right: Breast)  Patient Location: PACU  Anesthesia Type:General  Level of Consciousness: awake, alert , patient cooperative, and responds to stimulation  Airway & Oxygen Therapy: Patient Spontanous Breathing and Patient connected to nasal cannula oxygen  Post-op Assessment: Report given to RN, Post -op Vital signs reviewed and stable, and Patient moving all extremities X 4  Post vital signs: Reviewed and stable  Last Vitals:  Vitals Value Taken Time  BP 105/62 12/29/23 10:34  Temp 36.3 C 12/29/23 10:34  Pulse 69 12/29/23 10:40  Resp 16 12/29/23 10:40  SpO2 99 % 12/29/23 10:40  Vitals shown include unfiled device data.  Last Pain:  Vitals:   12/29/23 1034  TempSrc:   PainSc: 10-Worst pain ever         Complications: No notable events documented.

## 2023-12-29 NOTE — Discharge Instructions (Addendum)
 [facility] Office Phone Number [phone]  BREAST BIOPSY/ PARTIAL MASTECTOMY: POST OP INSTRUCTIONS  Always review your discharge instruction sheet given to you by the facility where your surgery was performed.  IF YOU HAVE DISABILITY OR FAMILY LEAVE FORMS, YOU MUST BRING THEM TO THE OFFICE FOR PROCESSING.  DO NOT GIVE THEM TO YOUR DOCTOR.  Take 2 tylenol  (acetominophen) three times a day for 3 days.  If you still have pain, add ibuprofen with food in between if able to take this (if you have kidney issues or stomach issues, do not take ibuprofen).  If both of those are not enough, add the narcotic pain pill.  If you find you are needing a lot of this overnight after surgery, call the next morning for a refill.    Prescriptions will not be filled after 5pm or on week-ends. Take your usually prescribed medications unless otherwise directed You should eat very light the first 24 hours after surgery, such as soup, crackers, pudding, etc.  Resume your normal diet the day after surgery. Most patients will experience some swelling and bruising in the breast.  Ice packs and a good support bra will help.  Swelling and bruising can take several days to resolve.  It is common to experience some constipation if taking pain medication after surgery.  Increasing fluid intake and taking a stool softener will usually help or prevent this problem from occurring.  A mild laxative (Milk of Magnesia or Miralax ) should be taken according to package directions if there are no bowel movements after 48 hours. Unless discharge instructions indicate otherwise, you may remove your bandages 48 hours after surgery, and you may shower at that time.  You may have steri-strips (small skin tapes) in place directly over the incision.  These strips should be left on the skin at least for for 7-10 days.    ACTIVITIES:  You may resume regular daily activities (gradually increasing) beginning the next day.  Wearing a  good support bra or sports bra (or the breast binder) minimizes pain and swelling.  You may have sexual intercourse when it is comfortable. No heavy lifting for 1-2 weeks (not over around 10 pounds).  You may drive when you no longer are taking prescription pain medication, you can comfortably wear a seatbelt, and you can safely maneuver your car and apply brakes. RETURN TO WORK:  __________3-14 days depending on job. _______________ Amanda Herring should see your doctor in the office for a follow-up appointment approximately two weeks after your surgery.  Your doctor's nurse will typically make your follow-up appointment when she calls you with your pathology report.  Expect your pathology report 3-4 business days after your surgery.  You may call to check if you do not hear from us  after three days.   WHEN TO CALL YOUR DOCTOR: Fever over 101.0 Nausea and/or vomiting. Extreme swelling or bruising. Continued bleeding from incision. Increased pain, redness, or drainage from the incision.  The clinic staff is available to answer your questions during regular business hours.  Please don't hesitate to call and ask to speak to one of the nurses for clinical concerns.  If you have a medical emergency, go to the nearest emergency room or call 911.  A surgeon from [facility] is always on call at the hospital.  For further questions, please visit [email or web address]

## 2023-12-29 NOTE — Interval H&P Note (Signed)
 History and Physical Interval Note:  12/29/2023 8:50 AM  Amanda Herring  has presented today for surgery, with the diagnosis of RIGHT BREAST CANCER.  The various methods of treatment have been discussed with the patient and family. After consideration of risks, benefits and other options for treatment, the patient has consented to  Procedure(s) with comments: EXCISION, LESION, BREAST (Right) - RE-EXCISION RIGHT BREAST LUMPECTOMY as a surgical intervention.  The patient's history has been reviewed, patient examined, no change in status, stable for surgery.  I have reviewed the patient's chart and labs.  Questions were answered to the patient's satisfaction.     Jina Nephew

## 2023-12-30 ENCOUNTER — Encounter (HOSPITAL_COMMUNITY): Payer: Self-pay | Admitting: General Surgery

## 2024-01-02 ENCOUNTER — Encounter: Payer: Self-pay | Admitting: *Deleted

## 2024-01-02 ENCOUNTER — Telehealth: Payer: Self-pay | Admitting: *Deleted

## 2024-01-02 ENCOUNTER — Ambulatory Visit: Payer: Self-pay | Admitting: General Surgery

## 2024-01-02 DIAGNOSIS — C50411 Malignant neoplasm of upper-outer quadrant of right female breast: Secondary | ICD-10-CM

## 2024-01-02 LAB — SURGICAL PATHOLOGY

## 2024-01-02 NOTE — Telephone Encounter (Signed)
 Received oncotype results of 12/5%. Spoke with patient and she is aware.  Referral placed for Dr. Dewey.

## 2024-01-03 ENCOUNTER — Encounter: Admitting: Physical Therapy

## 2024-01-04 ENCOUNTER — Ambulatory Visit: Admitting: Hematology and Oncology

## 2024-01-05 ENCOUNTER — Ambulatory Visit: Admitting: Physical Therapy

## 2024-01-10 ENCOUNTER — Encounter: Payer: Self-pay | Admitting: *Deleted

## 2024-01-11 ENCOUNTER — Telehealth: Payer: Self-pay

## 2024-01-11 NOTE — Telephone Encounter (Signed)
 Spoke with patient and confirmed appointment on 10/30

## 2024-01-12 ENCOUNTER — Inpatient Hospital Stay: Attending: Hematology and Oncology | Admitting: Hematology and Oncology

## 2024-01-12 ENCOUNTER — Ambulatory Visit: Attending: General Surgery | Admitting: Physical Therapy

## 2024-01-12 ENCOUNTER — Encounter: Payer: Self-pay | Admitting: Physical Therapy

## 2024-01-12 VITALS — BP 137/78 | HR 65 | Temp 97.7°F | Resp 16 | Wt 224.5 lb

## 2024-01-12 DIAGNOSIS — Z17411 Hormone receptor positive with human epidermal growth factor receptor 2 negative status: Secondary | ICD-10-CM | POA: Diagnosis not present

## 2024-01-12 DIAGNOSIS — Z170 Estrogen receptor positive status [ER+]: Secondary | ICD-10-CM | POA: Insufficient documentation

## 2024-01-12 DIAGNOSIS — R279 Unspecified lack of coordination: Secondary | ICD-10-CM | POA: Insufficient documentation

## 2024-01-12 DIAGNOSIS — M79604 Pain in right leg: Secondary | ICD-10-CM | POA: Insufficient documentation

## 2024-01-12 DIAGNOSIS — M6281 Muscle weakness (generalized): Secondary | ICD-10-CM | POA: Insufficient documentation

## 2024-01-12 DIAGNOSIS — Z483 Aftercare following surgery for neoplasm: Secondary | ICD-10-CM | POA: Diagnosis present

## 2024-01-12 DIAGNOSIS — C50411 Malignant neoplasm of upper-outer quadrant of right female breast: Secondary | ICD-10-CM | POA: Insufficient documentation

## 2024-01-12 DIAGNOSIS — M25611 Stiffness of right shoulder, not elsewhere classified: Secondary | ICD-10-CM | POA: Insufficient documentation

## 2024-01-12 DIAGNOSIS — Z1721 Progesterone receptor positive status: Secondary | ICD-10-CM | POA: Insufficient documentation

## 2024-01-12 DIAGNOSIS — R269 Unspecified abnormalities of gait and mobility: Secondary | ICD-10-CM | POA: Insufficient documentation

## 2024-01-12 DIAGNOSIS — R293 Abnormal posture: Secondary | ICD-10-CM | POA: Diagnosis present

## 2024-01-12 NOTE — Therapy (Signed)
 OUTPATIENT PHYSICAL THERAPY BREAST CANCER POST OP FOLLOW UP / Renewal   Patient Name: Amanda Herring MRN: [id number] DOB:29-Nov-1955, 68 y.o., female Today's Date: 01/12/2024  END OF SESSION:  PT End of Session - 01/12/24 1000     Visit Number 11    Number of Visits 19    Date for Recertification  03/08/24    Authorization Type [employer]    Authorization Time Period Med necessity; no visit limit    Progress Note Due on Visit 20    PT Start Time 0954    PT Stop Time 1120    PT Time Calculation (min) 86 min    Activity Tolerance Patient tolerated treatment well    Behavior During Therapy WFL for tasks assessed/performed          Past Medical History:  Diagnosis Date   Cancer (HCC)    Family history of adverse reaction to anesthesia    per patient her dad had post n/v   Family history of bladder cancer    GERD (gastroesophageal reflux disease)    Hepatitis C    s/p treatment 2012   History of blood transfusion 1981   with delivery w/baby   History of kidney stones    passed stones   Hypertension    Hypothyroidism    Pneumonia    x 1 yrs ago   PONV (postoperative nausea and vomiting)    Past Surgical History:  Procedure Laterality Date   ABDOMINAL HYSTERECTOMY     BREAST BIOPSY Right 11/04/2023   US  RT BREAST BX W LOC DEV 1ST LESION IMG BX SPEC US  GUIDE 11/04/2023 [facility]   BREAST EXCISIONAL BIOPSY Bilateral    1981/1980   BREAST LUMPECTOMY Left    1998   BREAST LUMPECTOMY Right 12/14/2023   Procedure: BREAST LUMPECTOMY;  Surgeon: Aron Shoulders, MD;  Location: [facility] OR;  Service: General;  Laterality: Right;   BREAST SURGERY     augmentation   RE-EXCISION OF BREAST LUMPECTOMY Right 12/29/2023   Procedure: EXCISION, LESION, BREAST;  Surgeon: Aron Shoulders, MD;  Location: [facility] OR;  Service: General;  Laterality: Right;  RE-EXCISION RIGHT BREAST LUMPECTOMY   SENTINEL NODE BIOPSY Right 12/14/2023   Procedure: BIOPSY, LYMPH NODE, SENTINEL;   Surgeon: Aron Shoulders, MD;  Location: [facility] OR;  Service: General;  Laterality: Right;  SENTINEL LYMPH NODE BIOPSY   Patient Active Problem List   Diagnosis Date Noted   Genetic testing 11/28/2023   Family history of bladder cancer    Malignant neoplasm of upper-outer quadrant of right breast in female, estrogen receptor positive (HCC) 11/11/2023    REFERRING PROVIDER: Dr. Shoulders Aron    REFERRING DIAG: Right breast cancer; RIGHT PES BURSA/RIGHT HAMSTRING STRAIN   THERAPY DIAG:  Muscle weakness (generalized)  Pain in right leg  Unspecified lack of coordination  Abnormal posture  Abnormality of gait and mobility  Malignant neoplasm of upper-outer quadrant of right breast in female, estrogen receptor positive (HCC)  Stiffness of right shoulder, not elsewhere classified  Aftercare following surgery for neoplasm  Rationale for Evaluation and Treatment: Rehabilitation  ONSET DATE: 12/14/2023  SUBJECTIVE:  SUBJECTIVE STATEMENT: Patient reports she had a right lumpectomy and sentinel node biopsy (2 negative nodes) on 12/14/2023 and then a re-excision on 12/29/2023 to obtain clear margins. Oncotype score was low and she will proceed with a radiation consult on 01/26/2024 followed by anti-estrogen therapy.  She reports significant right knee pain which has recently worsened. She believes this flare up is probably due to decreased activity since having recent breast cancer surgeries and not being able to be as active. She reports needing to see a doctor for this but said she wants to see a sports medicine doctor instead of an orthopedist.  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 She had a right lumpectomy and sentinel node biopsy (2  negative nodes) on 12/14/2023 and then a re-excision on 12/29/2023 to obtain clear margins. It is ER/PR positive and HER2 negative with a Ki67 of 1%. She had a left lumpectomy in 1998 due to breast cancer at [facility].   PATIENT GOALS:  Reassess how my recovery is going related to arm function, pain, and swelling. Decrease right knee pain so I can walk more and be more active.  PAIN:  Are you having pain? Yes: NPRS scale: Right knee pain 5/10. No complaints of pain related to breast cancer. Pain location: Right medial knee Pain description: Sharp Aggravating factors: Walking, weightbearing Relieving factors: Doing the PT exercises  PRECAUTIONS: Recent Surgery, right UE Lymphedema risk  RED FLAGS: None   ACTIVITY LEVEL / LEISURE: Only doing exercises given by PT and doing a seated elliptical exercise at home.  OBJECTIVE:   PATIENT SURVEYS:  QUICK DASH:  Quick Dash - 01/12/24 0001     Open a tight or new jar No difficulty    Do heavy household chores (wash walls, wash floors) Mild difficulty    Carry a shopping bag or briefcase No difficulty    Wash your back No difficulty    Use a knife to cut food No difficulty    Recreational activities in which you take some force or impact through your arm, shoulder, or hand (golf, hammering, tennis) Mild difficulty    During the past week, to what extent has your arm, shoulder or hand problem interfered with your normal social activities with family, friends, neighbors, or groups? Slightly    During the past week, to what extent has your arm, shoulder or hand problem limited your work or other regular daily activities Slightly    Arm, shoulder, or hand pain. None    Tingling (pins and needles) in your arm, shoulder, or hand None    Difficulty Sleeping Mild difficulty    DASH Score 11.36 %           PATIENT SURVEYS:  LEFS  Extreme difficulty/unable (0), Quite a bit of difficulty (1), Moderate difficulty (2), Little difficulty (3), No  difficulty (4) Survey date:  11/17/23 01/12/2024  Any of your usual work, housework or school activities 1 3  2. Usual hobbies, recreational or sporting activities 0 3  3. Getting into/out of the bath 1 4  4. Walking between rooms 1 3  5. Putting on socks/shoes 2 4  6. Squatting  1 3  7. Lifting an object, like a bag of groceries from the floor 1 3  8. Performing light activities around your home 1 4  9. Performing heavy activities around your home 0 3  10. Getting into/out of a car 1 3  11. Walking  2 blocks 0 3  12. Walking 1 mile 0 2  13. Going up/down 10 stairs (1 flight) 1 3  14. Standing for 1 hour 1 3  15.  sitting for 1 hour 4 3  16. Running on even ground 0 2  17. Running on uneven ground 0 1  18. Making sharp turns while running fast 0 1  19. Hopping  0 1  20. Rolling over in bed 3 3  Score total:  18 55 (score significantly increased due to inactivity since breast surgery     FUNCTIONAL TESTS:  5 times sit to stand: 11s no AD lowest table setting at eval; today 01/12/2024, completed in 10 sec 2 minute walk test: 384' no AD at eval; 330' today 01/12/2024; limited by pain   GAIT: Distance walked: TWO MINUTE WALK TEST *individual walks without assistance for 2 minutes and the distance is measured *start timing when the individual is instructed to "Go" *stop timing at 2 minutes *assistive devices can be used but should be kept consistent and  documented from test to test  *if physical assistance is required to walk, this should not be performed * a measuring wheel is helpful to determine distance walked *should be performed at the fastest speed possible   GenderAge in years (# in study)Mean distance in meters Female              18-54 (260)                         200.9 55-59 (23)                           191.0 60-64 (29)                           179.1 (587 feet) 65-69 (22)                           184.2 70-74 (32)                           172.2 75-79 (19)                            157.6 80-85 (32)                           144.1   Female          18-54 (539)                         183.0 55-59 (30)                           176.4 60-64 (48)                           166.4 65-69 (22)                           155.2 70-74 (33)  145.9 75-79 (14)                           140.9 80-85 (34)                           134.3   Assistive device utilized: None Level of assistance: Complete Independence  Comments: EVAL: trunk flexion, trunk lateral sway for limb advancement, increased step length at Rt with increased pain with attempts to shorten step, decreased cadence 01/12/2024: Slightly flexed posture; very antalgic gait  OBSERVATIONS: Right axillary and breast incisions both have healed well with some moderate scar tissue tightness present. Slightly tender to palpation at incisions and mild lateral breast edema present. One axillary cord present deep in axilla.  POSTURE:  Forward head, rounded shoulders  LYMPHEDEMA ASSESSMENT:   UPPER EXTREMITY AROM/PROM:   A/PROM RIGHT   Eval Addendum - added after eval 11/24/23   RIGHT 01/12/2024  Shoulder extension 70 59  Shoulder flexion 153 144  Shoulder abduction 175 150  Shoulder internal rotation 80 72  Shoulder external rotation 75 82                          (Blank rows = not tested)   A/PROM LEFT   Eval Addendum - added after eval 11/24/23  Shoulder extension 58  Shoulder flexion 163  Shoulder abduction 170  Shoulder internal rotation 75  Shoulder external rotation 95                          (Blank rows = not tested)   UPPER EXTREMITY STRENGTH: WFL   LYMPHEDEMA ASSESSMENTS (in cm):    LANDMARK RIGHT   Eval Addendum - added after eval 11/24/23 RIGHT 01/12/2024  10 cm proximal to olecranon process 33.7 32.6  Olecranon process 27.6 27.2  10 cm proximal to ulnar styloid process 21.8 21.9  Just proximal to ulnar styloid process 16.1 14.6  Across hand at thumb  web space 19.5 18.2  At base of 2nd digit 6.2 5.8  (Blank rows = not tested)   LANDMARK LEFT   Eval Addendum - added after eval 11/24/23 LEFT 01/12/2024  10 cm proximal to olecranon process 35.2 35.1  Olecranon process 30 27.3  10 cm proximal to ulnar styloid process 22.5 22.8  Just proximal to ulnar styloid process 16.2 14.7  Across hand at thumb web space 20.2 17.9  At base of 2nd digit 6.5 6  (Blank rows = not tested)  Surgery type/Date: 12/14/2023 right lumpectomy and sentinel node biopsy Number of lymph nodes removed: 2 Current/past treatment (chemo, radiation, hormone therapy): none Other symptoms:  Heaviness/tightness No Pain No Pitting edema No Infections No Decreased scar mobility Yes Stemmer sign No  PATIENT EDUCATION:  Education details: Reviewed HEP for post op breast cancer; instructed with scar massage for breast and axilla; educated pt briefly on lymphedema risk reduction and encouraged her to watch video (see link in pt instructions) Person educated: Patient Education method: Explanation, demonstration, handout Education comprehension: verbalized understanding and returned demonstration  HOME EXERCISE PROGRAM: Reviewed previously given post op HEP.  TODAY'S TREATMENT: Manual: Soft tissue mobilization to right anterior knee with biofreeze in supine. Myofascial release to right axilla for cording. Ther Exer: NuStep level 4 (legs only), LEs 7 x7 min while monitoring pt's pain   Right terminal knee extension against  purple ball on wall x15 with moderate (unchanged)  pain  ASSESSMENT:  CLINICAL IMPRESSION: Patient is healing well s/p breast lumpectomy and sentinel node biopsy on 12/14/2023 and a re-excision on 12/29/2023. Unfortunately, those surgeries have impacted her right knee as she has been unable to be as active. Her primary complaint is significant pain with walking and feeling like it may give way. She is lacking some moderate right shoulder ROM and has  axillary cording so she will benefit from PT to improve those deficits. She will also benefit from continued PT related to LE strengthening and to address knee pain. She will see a sports medicine doctor next week 01/19/2024.  Pt will benefit from skilled therapeutic intervention to improve on the following deficits: Decreased knowledge of precautions, impaired UE functional use, pain, decreased ROM, postural dysfunction.   PT treatment/interventions: ADL/Self care home management, [phone]- PT Re-evaluation, 97110-Therapeutic exercises, 97530- Therapeutic activity, [id number]- Neuromuscular re-education, [phone]- Self Care, 02859- Manual therapy, [phone]- Gait training, [phone]- Vasopneumatic device, Patient/Family education, Balance training, Stair training, Taping, Scar mobilization, Moist heat, and heat/ice only for right LE   GOALS: Goals reviewed with patient? Yes  GOALS MET AT BREAST CANCER EVAL:  GOALS Name Target Date Goal status  1 Pt will be able to verbalize understanding of pertinent lymphedema risk reduction practices relevant to her dx specifically related to skin care.  Baseline:  No knowledge Eval Achieved at eval  2 Pt will be able to return demo and/or verbalize understanding of the post op HEP related to regaining shoulder ROM. Baseline:  No knowledge Eval Achieved at eval  3 Pt will be able to verbalize understanding of the importance of viewing the post op After Breast CA Class video for further lymphedema risk reduction education and therapeutic exercise.  Baseline:  No knowledge Eval Achieved at eval   LONG TERM GOALS FOR BREAST CANCER:   GOALS Name Target Date  Goal status  1 Pt will demonstrate she has regained full shoulder ROM and function post operatively compared to baselines.  Baseline: 02/09/2024 Ongoing  2 Patient will improve DASH score to be zero for improved overall UE function. 02/09/2024 INITIAL  3 Patient will verbalize good understanding of lymphedema risk reduction  practices 02/09/2024 INITIAL   SHORT TERM GOALS for hamstring strain: Target date: 12/15/23 (revised 01/12/2024 for target date 02/09/2024) Pt to be I with HEP for carry over and continuing recommendations for improved outcomes.   Baseline: Goal status: met 9/30   2.  Pt to demonstrate full ROM of Rt hip without pain for improved gait mechanics to tolerate return to PLOF for walking.  Baseline:  Goal status: partially met 01/12/2024   3.  Pt to demonstrate x10 sit to stands without pain or compensation for improved hip strength for walking. Baseline:  Goal status: met 9/30   4.  Pt to demonstrate 5/5 bil hip strength without pain for improved ability to transfer from ground to standing to return to gardening Baseline:  Goal status: partially met, knee pain affects transfers; 01/12/2024 not tested due to increased pain     LONG TERM GOALS for hamstring strain: Target date: 01/12/24 (revised 01/12/2024 with new target date of 03/08/2024)   Pt to be I with advanced HEP for carry over and continuing recommendations for improved outcomes.   Baseline:  Goal status: Ongoing 01/12/2024   2.  Pt to report ability to tolerate walking at least 1 mile with no more than 1/10 pain for improved QOL.  Baseline:  Goal status: Ongoing 01/12/2024   3.  Pt to demonstrate improved within age related norms due to improved cadence and mechanics of gait for improved ability to complete grocery errands.  Baseline:  Goal status: Ongoing 01/12/2024   4.  Pt to demonstrate improved 5xSTS to no more than 7s for decreased fall risk.  Baseline:  Goal status: Ongoing (10 sec 01/12/2024)      PLAN:  PT FREQUENCY/DURATION: 2x/week for 8 weeks  PLAN FOR NEXT SESSION: Manual techniques to increase right shoulder flexion and abduction and to address axillary cording. NuStep, right leg strengthening; consider right knee kinesiotaping for pain?  [facility]  [address]  [place] [address]  [phone]  After Breast Cancer Class Video It is recommended you view the ABC class video to be educated on lymphedema risk reduction. This video lasts for about 30 minutes. It can be viewed on our website here: [email or web address]  Scar massage You can begin gentle scar massage to you incision sites. Gently place one hand on the incision and move the skin (without sliding on the skin) in various directions. Do this for a few minutes and then you can gently massage either coconut oil or vitamin E cream into the scars.  Compression garment You should continue wearing your compression bra until you feel like you no longer have swelling.  Home exercise Program Continue doing the exercises you were given until you feel like you can do them without feeling any tightness at the end.   Walking Program Studies show that 30 minutes of walking per day (fast enough to elevate your heart rate) can significantly reduce the risk of a cancer recurrence. If you can't walk due to other medical reasons, we encourage you to find another activity you could do (like a stationary bike or water exercise).  Posture After breast cancer surgery, people frequently sit with rounded shoulders posture because it puts their incisions on slack and feels better. If you sit like this and scar tissue forms in that position, you can become very tight and have pain sitting or standing with good posture. Try to be aware of your posture and sit and stand up tall to heal properly.  Follow up PT: It is recommended you return every 3 months for the first 3 years following surgery to be assessed on the SOZO machine for an L-Dex score. This helps prevent clinically significant lymphedema in 95% of patients. These follow up screens are 10 minute appointments that you are not billed for.  Eward Wonda Sharps, PT 01/12/24 11:48 AM

## 2024-01-12 NOTE — Patient Instructions (Signed)
 [facility]  [address], [address]  [place] [address]  [phone]  After Breast Cancer Class Video It is recommended you view the ABC class video to be educated on lymphedema risk reduction. This video lasts for about 30 minutes. It can be viewed on our website here: [email or web address]  Scar massage You can begin gentle scar massage to you incision sites. Gently place one hand on the incision and move the skin (without sliding on the skin) in various directions. Do this for a few minutes and then you can gently massage either coconut oil or vitamin E cream into the scars.  Compression garment You should continue wearing your compression bra until you feel like you no longer have swelling.  Home exercise Program Continue doing the exercises you were given until you feel like you can do them without feeling any tightness at the end.   Walking Program Studies show that 30 minutes of walking per day (fast enough to elevate your heart rate) can significantly reduce the risk of a cancer recurrence. If you can't walk due to other medical reasons, we encourage you to find another activity you could do (like a stationary bike or water exercise).  Posture After breast cancer surgery, people frequently sit with rounded shoulders posture because it puts their incisions on slack and feels better. If you sit like this and scar tissue forms in that position, you can become very tight and have pain sitting or standing with good posture. Try to be aware of your posture and sit and stand up tall to heal properly.  Follow up PT: It is recommended you return every 3 months for the first 3 years following surgery to be assessed on the SOZO machine for an L-Dex score. This helps prevent clinically significant lymphedema in 95% of patients. These follow up screens are 10 minute appointments that you are not billed  for.

## 2024-01-12 NOTE — Progress Notes (Addendum)
 Location of Breast Cancer: right breast   Histology per Pathology Report:    Receptor Status: ER(pos), PR (pos), Her2-neu (neg), Ki-(1%)  Did patient present with symptoms (if so, please note symptoms) or was this found on screening mammography?  Patient presented with a right breast lump on 11/04/23  Past/Anticipated interventions by surgeon, if any:  12/14/23 BREAST LUMPECTOMY (Right)  BIOPSY, LYMPH NODE, SENTINEL (Right) - SENTINEL LYMPH NODE BIOPSY as a surgical intervention.   Past/Anticipated interventions by medical oncology, if any: None  Lymphedema issues, if any:   Having some underarm swelling.  Pain issues, if any:  None  Skin issues if any None  SAFETY ISSUES: Prior radiation? None Pacemaker/ICD? None Possible current pregnancy? No, postmenopausal Is the patient on methotrexate? None  Current Complaints / other details:  None    Amanda JULIANNA Frost, LPN 89/69/7974,6:67 PM

## 2024-01-12 NOTE — Assessment & Plan Note (Signed)
  Assessment & Plan  Assessment & Plan Invasive lobular carcinoma of breast, post-surgical, planned radiation and endocrine therapy Post-surgical state with clear margins. Negative lymph nodes. Low oncotype score excludes chemotherapy. Radiation and long-term endocrine therapy with aromatase inhibitors recommended. Aromatase inhibitors preferred due to postmenopausal status and efficacy. Bone density monitoring necessary due to aromatase inhibitors. - Proceed with radiation therapy after consultation in the week of November 13th. - Initiate aromatase inhibitor therapy after completion of radiation therapy, likely starting in January. - Schedule follow-up appointment in January to start endocrine therapy. - Monitor bone density regularly during aromatase inhibitor therapy.  Monitoring for recurrence of breast cancer with planned MRD testing Plan to monitor recurrence using MRD testing for cell-free DNA.  - Perform MRD testing every six months after completion of radiation therapy  Amber Stalls MD

## 2024-01-12 NOTE — Progress Notes (Signed)
  Cancer Center CONSULT NOTE  Patient Care Team: Dyane Anthony RAMAN, FNP as PCP - General (Family Medicine) Tyree Nanetta SAILOR, RN as Oncology Nurse Navigator Gerome, Devere HERO, RN as Oncology Nurse Navigator Aron Shoulders, MD as Consulting Physician (General Surgery) Loretha Ash, MD as Consulting Physician (Hematology and Oncology) Dewey Rush, MD as Consulting Physician (Radiation Oncology)  CHIEF COMPLAINTS/PURPOSE OF CONSULTATION:  Newly diagnosed breast cancer  HISTORY OF PRESENTING ILLNESS:  Amanda Herring 68 y.o. female is here because of recent diagnosis of right breast ILC.  I reviewed her records extensively and collaborated the history with the patient.  SUMMARY OF ONCOLOGIC HISTORY: Oncology History  Malignant neoplasm of upper-outer quadrant of right breast in female, estrogen receptor positive (HCC)  11/04/2023 Mammogram   There is a partly obscured oval mass in the retroareolar right breast corresponding to the palpable lump, measuring approximately 1.2 cm in size.   There is a small stable mass in the lateral retroareolar left breast. There no other defined masses, no areas of architectural distortion and no suspicious calcifications. The patient has retropectoral saline implants.  Targeted ultrasound is performed, showing a cystic and solid mass, predominantly solid, hypoechoic and superficial in the 12 o'clock position of the retroareolar right breast, corresponding to the lump. This measures 1.9 x 1.4 x 1.7 cm. There is internal blood flow within the solid components on color Doppler analysis. Margins are mostly ill-defined.   11/04/2023 Pathology Results   1. Breast, right, needle core biopsy, 12 o'clock :       INVASIVE MAMMARY CARCINOMA, SEE NOTE       TUBULE FORMATION: SCORE 3       NUCLEAR PLEOMORPHISM: SCORE 2       MITOTIC COUNT: SCORE 1       TOTAL SCORE: 6       OVERALL GRADE: 2       LYMPHOVASCULAR INVASION: NOT IDENTIFIED        CANCER LENGTH: 1.1 CM       CALCIFICATIONS: NOT IDENTIFIED       OTHER FINDINGS: NONE    Estrogen Receptor:  100%, POSITIVE, STRONG STAINING INTENSITY  Progesterone Receptor:  10%, POSITIVE, WEAK STAINING INTENSITY  Proliferation Marker Ki67:  1%  Her 2 by FISH negative.    11/11/2023 Initial Diagnosis   Malignant neoplasm of upper-outer quadrant of right breast in female, estrogen receptor positive (HCC)   11/16/2023 Cancer Staging   Staging form: Breast, AJCC 8th Edition - Clinical stage from 11/16/2023: Stage IA (cT1c, cN0, cM0, G2, ER+, PR+, HER2-) - Signed by Lanell Donald Stagger, PA-C on 11/16/2023 Stage prefix: Initial diagnosis Method of lymph node assessment: Clinical Histologic grading system: 3 grade system   11/26/2023 Genetic Testing   Negative genetic testing on the CancerNext-Expanded+RNAinsight panel.  The report date is November 26, 2023.  The CancerNext-Expanded gene panel offered by [facility] and includes sequencing, rearrangement, and RNA analysis for the following 77 genes: AIP, ALK, APC, ATM, BAP1, BARD1, BMPR1A, BRCA1, BRCA2, BRIP1, CDC73, CDH1, CDK4, CDKN1B, CDKN2A, CEBPA, CHEK2, CTNNA1, DDX41, DICER1, ETV6, FH, FLCN, GATA2, LZTR1, MAX, MBD4, MEN1, MET, MLH1, MSH2, MSH3, MSH6, MUTYH, NF1, NF2, NTHL1, PALB2, PHOX2B, PMS2, POT1, PRKAR1A, PTCH1, PTEN, RAD51C, RAD51D, RB1, RET, RPS20, RUNX1, SDHA, SDHAF2, SDHB, SDHC, SDHD, SMAD4, SMARCA4, SMARCB1, SMARCE1, STK11, SUFU, TMEM127, TP53, TSC1, TSC2, VHL, and WT1 (sequencing and deletion/duplication); AXIN2, CTNNA1, DDX41, EGFR, HOXB13, KIT, MBD4, MITF, MSH3, PDGFRA, POLD1 and POLE (sequencing only); EPCAM and GREM1 (deletion/duplication only). RNA  data is routinely analyzed for use in variant interpretation for all genes.      Discussed the use of AI scribe software for clinical note transcription with the patient, who gave verbal consent to proceed.  History of Present Illness Amanda Herring is a 68 year old female  with a history of breast cancer who presents for evaluation of invasive lobular breast cancer. She is accompanied by her daughter, Amanda Herring.  She has been diagnosed with stage one, grade two invasive lobular breast cancer located in the nipple area of the breast, with the largest dimension of the tumor being approximately 1.9 centimeters. The cancer is estrogen receptor positive (ER 100%) and progesterone receptor positive (PR 10%), with a KI-67 of 1%.  She has a history of breast cancer on the opposite side, diagnosed as ductal carcinoma in situ (DCIS) in 1998, for which she underwent surgery without radiation or hormonal therapy. She has had multiple lumpectomies since the 1980s and breast reduction surgery. Recently, she noticed changes in a previously identified fluid-filled cyst, which has been growing and hardening over the past three months.  She had surgery, final path showed 28 mm grade 2 ILC, margins pos so underwent re excision, 2/2 SLN neg. Oncotype Dx of 12, no role for adj chemotherapy. She is doing well, recovering from second surgery She has a follow up with Dr Dewey. Rest of the pertinent 10 point ROS reviewed and neg.     MEDICAL HISTORY:  Past Medical History:  Diagnosis Date   Cancer [facility])    Family history of adverse reaction to anesthesia    per patient her dad had post n/v   Family history of bladder cancer    GERD (gastroesophageal reflux disease)    Hepatitis C    s/p treatment 2012   History of blood transfusion 1981   with delivery w/baby   History of kidney stones    passed stones   Hypertension    Hypothyroidism    Pneumonia    x 1 yrs ago   PONV (postoperative nausea and vomiting)     SURGICAL HISTORY: Past Surgical History:  Procedure Laterality Date   ABDOMINAL HYSTERECTOMY     BREAST BIOPSY Right 11/04/2023   US  RT BREAST BX W LOC DEV 1ST LESION IMG BX SPEC US  GUIDE 11/04/2023 [facility] MAMMOGRAPHY   BREAST EXCISIONAL BIOPSY Bilateral    1981/1980    BREAST LUMPECTOMY Left    1998   BREAST LUMPECTOMY Right 12/14/2023   Procedure: BREAST LUMPECTOMY;  Surgeon: Aron Shoulders, MD;  Location: [facility] OR;  Service: General;  Laterality: Right;   BREAST SURGERY     augmentation   RE-EXCISION OF BREAST LUMPECTOMY Right 12/29/2023   Procedure: EXCISION, LESION, BREAST;  Surgeon: Aron Shoulders, MD;  Location: [facility] OR;  Service: General;  Laterality: Right;  RE-EXCISION RIGHT BREAST LUMPECTOMY   SENTINEL NODE BIOPSY Right 12/14/2023   Procedure: BIOPSY, LYMPH NODE, SENTINEL;  Surgeon: Aron Shoulders, MD;  Location: [facility] OR;  Service: General;  Laterality: Right;  SENTINEL LYMPH NODE BIOPSY    SOCIAL HISTORY: Social History   Socioeconomic History   Marital status: Divorced    Spouse name: Not on file   Number of children: Not on file   Years of education: Not on file   Highest education level: Not on file  Occupational History   Not on file  Tobacco Use   Smoking status: Never   Smokeless tobacco: Never  Vaping Use   Vaping status: Never  Used  Substance and Sexual Activity   Alcohol use: No   Drug use: No   Sexual activity: Not Currently    Birth control/protection: Post-menopausal  Other Topics Concern   Not on file  Social History Narrative   Not on file   Social Drivers of Health   Financial Resource Strain: Not on file  Food Insecurity: No Food Insecurity (11/16/2023)   Hunger Vital Sign    Worried About Running Out of Food in the Last Year: Never true    Ran Out of Food in the Last Year: Never true  Transportation Needs: No Transportation Needs (11/16/2023)   PRAPARE - Administrator, Civil Service (Medical): No    Lack of Transportation (Non-Medical): No  Physical Activity: Not on file  Stress: Not on file  Social Connections: Not on file  Intimate Partner Violence: Not At Risk (11/16/2023)   Humiliation, Afraid, Rape, and Kick questionnaire    Fear of Current or Ex-Partner: No    Emotionally Abused: No     Physically Abused: No    Sexually Abused: No    FAMILY HISTORY: Family History  Problem Relation Age of Onset   Skin cancer Father    Skin cancer Brother    Bladder Cancer Maternal Uncle    Breast cancer Neg Hx     ALLERGIES:  is allergic to codeine and dilaudid  [hydromorphone  hcl].  MEDICATIONS:  Current Outpatient Medications  Medication Sig Dispense Refill   ascorbic acid (VITAMIN C) 500 MG tablet Take 500 mg by mouth daily.     B Complex-C (SUPER B COMPLEX PO) Take 1 tablet by mouth daily.     Boswellia-Glucosamine-Vit D (OSTEO BI-FLEX ONE PER DAY PO) Take 1 tablet by mouth daily.     Calcium-Vitamin D (CALTRATE 600 PLUS-VIT D PO) Take 1 tablet by mouth daily.     cetirizine (ZYRTEC) 10 MG tablet Take 10 mg by mouth in the morning.     famotidine (PEPCID) 20 MG tablet Take 20 mg by mouth daily as needed for heartburn or indigestion.     hydrochlorothiazide (HYDRODIURIL) 25 MG tablet Take 12.5 mg by mouth daily.     ibuprofen (ADVIL,MOTRIN) 200 MG tablet Take 600 mg by mouth every 6 (six) hours as needed for moderate pain.     levothyroxine (SYNTHROID) 50 MCG tablet Take 50 mcg by mouth every morning.     Multiple Vitamins-Minerals (CENTRUM SILVER ADULT 50+ PO) Take 1 tablet by mouth every other day.     Omega-3 Fatty Acids (FISH OIL) 1000 MG CAPS Take 1,000 mg by mouth daily.     oxyCODONE  (OXY IR/ROXICODONE ) 5 MG immediate release tablet Take 1 tablet (5 mg total) by mouth every 6 (six) hours as needed for severe pain (pain score 7-10). 10 tablet 0   POTASSIUM PO Take 550 mg by mouth every other day.     zolpidem (AMBIEN) 10 MG tablet Take 5-10 mg by mouth at bedtime.     No current facility-administered medications for this visit.     PHYSICAL EXAMINATION: ECOG PERFORMANCE STATUS: 0 - Asymptomatic  Vitals:   01/12/24 0844  BP: 137/78  Pulse: 65  Resp: 16  Temp: 97.7 F (36.5 C)  SpO2: 97%    Filed Weights   01/12/24 0844  Weight: 224 lb 8 oz (101.8 kg)      GENERAL:alert, no distress and comfortable   LABORATORY DATA:  I have reviewed the data as listed Lab Results  Component  Value Date   WBC 5.5 12/29/2023   HGB 13.7 12/29/2023   HCT 40.6 12/29/2023   MCV 92.9 12/29/2023   PLT 197 12/29/2023   Lab Results  Component Value Date   NA 138 12/29/2023   K 3.5 12/29/2023   CL 104 12/29/2023   CO2 22 12/29/2023    RADIOGRAPHIC STUDIES: I have personally reviewed the radiological reports and agreed with the findings in the report.  ASSESSMENT AND PLAN:  Malignant neoplasm of upper-outer quadrant of right breast in female, estrogen receptor positive [facility])  Assessment & Plan  Assessment & Plan Invasive lobular carcinoma of breast, post-surgical, planned radiation and endocrine therapy Post-surgical state with clear margins. Negative lymph nodes. Low oncotype score excludes chemotherapy. Radiation and long-term endocrine therapy with aromatase inhibitors recommended. Aromatase inhibitors preferred due to postmenopausal status and efficacy. Bone density monitoring necessary due to aromatase inhibitors. - Proceed with radiation therapy after consultation in the week of November 13th. - Initiate aromatase inhibitor therapy after completion of radiation therapy, likely starting in January. - Schedule follow-up appointment in January to start endocrine therapy. - Monitor bone density regularly during aromatase inhibitor therapy.  Monitoring for recurrence of breast cancer with planned MRD testing Plan to monitor recurrence using MRD testing for cell-free DNA.  - Perform MRD testing every six months after completion of radiation therapy  Amber Stalls MD     All questions were answered. The patient knows to call the clinic with any problems, questions or concerns.    Amber Stalls, MD 01/12/24

## 2024-01-17 ENCOUNTER — Ambulatory Visit: Attending: General Surgery

## 2024-01-17 DIAGNOSIS — M79604 Pain in right leg: Secondary | ICD-10-CM | POA: Diagnosis present

## 2024-01-17 DIAGNOSIS — R293 Abnormal posture: Secondary | ICD-10-CM | POA: Diagnosis present

## 2024-01-17 DIAGNOSIS — M25611 Stiffness of right shoulder, not elsewhere classified: Secondary | ICD-10-CM | POA: Insufficient documentation

## 2024-01-17 DIAGNOSIS — M6281 Muscle weakness (generalized): Secondary | ICD-10-CM | POA: Insufficient documentation

## 2024-01-17 DIAGNOSIS — R279 Unspecified lack of coordination: Secondary | ICD-10-CM | POA: Diagnosis present

## 2024-01-17 DIAGNOSIS — R269 Unspecified abnormalities of gait and mobility: Secondary | ICD-10-CM | POA: Diagnosis present

## 2024-01-17 DIAGNOSIS — Z170 Estrogen receptor positive status [ER+]: Secondary | ICD-10-CM | POA: Insufficient documentation

## 2024-01-17 DIAGNOSIS — Z483 Aftercare following surgery for neoplasm: Secondary | ICD-10-CM | POA: Diagnosis present

## 2024-01-17 DIAGNOSIS — C50411 Malignant neoplasm of upper-outer quadrant of right female breast: Secondary | ICD-10-CM | POA: Insufficient documentation

## 2024-01-17 NOTE — Therapy (Signed)
 OUTPATIENT PHYSICAL THERAPY BREAST CANCER TREATMENT   Patient Name: Amanda Herring MRN: [id number] DOB:April 25, 1955, 68 y.o., female Today's Date: 01/17/2024  END OF SESSION:  PT End of Session - 01/17/24 0922     Visit Number 12    Number of Visits 19    Date for Recertification  03/08/24    Authorization Type [employer]    Authorization Time Period Med necessity; no visit limit    Progress Note Due on Visit 20    PT Start Time 0904    PT Stop Time 0950   pt needed to leave early   PT Time Calculation (min) 46 min    Activity Tolerance Patient tolerated treatment well    Behavior During Therapy [facility] for tasks assessed/performed          Past Medical History:  Diagnosis Date   Cancer (HCC)    Family history of adverse reaction to anesthesia    per patient her dad had post n/v   Family history of bladder cancer    GERD (gastroesophageal reflux disease)    Hepatitis C    s/p treatment 2012   History of blood transfusion 1981   with delivery w/baby   History of kidney stones    passed stones   Hypertension    Hypothyroidism    Pneumonia    x 1 yrs ago   PONV (postoperative nausea and vomiting)    Past Surgical History:  Procedure Laterality Date   ABDOMINAL HYSTERECTOMY     BREAST BIOPSY Right 11/04/2023   US  RT BREAST BX W LOC DEV 1ST LESION IMG BX SPEC US  GUIDE 11/04/2023 [facility]MOGRAP[facility]   BREAST EXCISIONAL BIOPSY Bilateral    1981/1980   BREAST LUMPECTOMY Left    1998   BREAST LUMPECTOMY Right 12/14/2023   Procedure: BREAST LUMPECTOMY;  Surgeon: Aron Shoulders, MD;  Location: [facility] OR;  Service: General;  Laterality: Right;   BREAST SURGERY     augmentation   RE-EXCISION OF BREAST LUMPECTOMY Right 12/29/2023   Procedure: EXCISION, LESION, BREAST;  Surgeon: Aron Shoulders, MD;  Location: [facility] OR;  Service: General;  Laterality: Right;  RE-EXCISION RIGHT BREAST LUMPECTOMY   SENTINEL NODE BIOPSY Right 12/14/2023   Procedure: BIOPSY, LYMPH NODE, SENTINEL;   Surgeon: Aron Shoulders, MD;  Location: [facility] OR;  Service: General;  Laterality: Right;  SENTINEL LYMPH NODE BIOPSY   Patient Active Problem List   Diagnosis Date Noted   Genetic testing 11/28/2023   Family history of bladder cancer    Malignant neoplasm of upper-outer quadrant of right breast in female, estrogen receptor positive (HCC) 11/11/2023    REFERRING PROVIDER: Dr. Shoulders Aron    REFERRING DIAG: Right breast cancer; RIGHT PES BURSA/RIGHT HAMSTRING STRAIN   THERAPY DIAG:  Muscle weakness (generalized)  Pain in right leg  Unspecified lack of coordination  Abnormal posture  Abnormality of gait and mobility  Malignant neoplasm of upper-outer quadrant of right breast in female, estrogen receptor positive (HCC)  Stiffness of right shoulder, not elsewhere classified  Aftercare following surgery for neoplasm  Rationale for Evaluation and Treatment: Rehabilitation  ONSET DATE: 12/14/2023  SUBJECTIVE:  SUBJECTIVE STATEMENT: I started noticing some redness at the front of my Rt breast yesterday and then this morning there was more redness, warmth and I feel a hard ball in it when I was laying on my back last night. Plus I just feel a little achy overall.   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 She had a right lumpectomy and sentinel node biopsy (2 negative nodes) on 12/14/2023 and then a re-excision on 12/29/2023 to obtain clear margins. It is ER/PR positive and HER2 negative with a Ki67 of 1%. She had a left lumpectomy in 1998 due to breast cancer at [facility].   PATIENT GOALS:  Reassess how my recovery is going related to arm function, pain, and swelling. Decrease right knee pain so I can walk more and be more  active.  PAIN:  Are you having pain? Yes: NPRS scale: Right knee pain 5/10. No complaints of pain related to breast cancer. Pain location: Right medial knee Pain description: Sharp Aggravating factors: Walking, weightbearing Relieving factors: Doing the PT exercises  PRECAUTIONS: Recent Surgery, right UE Lymphedema risk  RED FLAGS: None   ACTIVITY LEVEL / LEISURE: Only doing exercises given by PT and doing a seated elliptical exercise at home.  OBJECTIVE:   PATIENT SURVEYS:  QUICK DASH:     PATIENT SURVEYS:  LEFS  Extreme difficulty/unable (0), Quite a bit of difficulty (1), Moderate difficulty (2), Little difficulty (3), No difficulty (4) Survey date:  11/17/23 01/12/2024  Any of your usual work, housework or school activities 1 3  2. Usual hobbies, recreational or sporting activities 0 3  3. Getting into/out of the bath 1 4  4. Walking between rooms 1 3  5. Putting on socks/shoes 2 4  6. Squatting  1 3  7. Lifting an object, like a bag of groceries from the floor 1 3  8. Performing light activities around your home 1 4  9. Performing heavy activities around your home 0 3  10. Getting into/out of a car 1 3  11. Walking 2 blocks 0 3  12. Walking 1 mile 0 2  13. Going up/down 10 stairs (1 flight) 1 3  14. Standing for 1 hour 1 3  15.  sitting for 1 hour 4 3  16. Running on even ground 0 2  17. Running on uneven ground 0 1  18. Making sharp turns while running fast 0 1  19. Hopping  0 1  20. Rolling over in bed 3 3  Score total:  18 55 (score significantly increased due to inactivity since breast surgery     FUNCTIONAL TESTS:  5 times sit to stand: 11s no AD lowest table setting at eval; today 01/12/2024, completed in 10 sec 2 minute walk test: 384' no AD at eval; 330' today 01/12/2024; limited by pain   GAIT: Distance walked: TWO MINUTE WALK TEST *individual walks without assistance for 2 minutes and the distance is measured *start timing when the individual is  instructed to "Go" *stop timing at 2 minutes *assistive devices can be used but should be kept consistent and  documented from test to test  *if physical assistance is required to walk, this should not be performed * a measuring wheel is helpful to determine distance walked *should be performed at the fastest speed possible   GenderAge in years (# in study)Mean distance in meters Female  18-54 (260)                         200.9 55-59 (23)                           191.0 60-64 (29)                           179.1 (587 feet) 65-69 (22)                           184.2 70-74 (32)                           172.2 75-79 (19)                           157.6 80-85 (32)                           144.1   Female          18-54 (539)                         183.0 55-59 (30)                           176.4 60-64 (48)                           166.4 65-69 (22)                           155.2 70-74 (33)                           145.9 75-79 (14)                           140.9 80-85 (34)                           134.3   Assistive device utilized: None Level of assistance: Complete Independence  Comments: EVAL: trunk flexion, trunk lateral sway for limb advancement, increased step length at Rt with increased pain with attempts to shorten step, decreased cadence 01/12/2024: Slightly flexed posture; very antalgic gait  OBSERVATIONS: Right axillary and breast incisions both have healed well with some moderate scar tissue tightness present. Slightly tender to palpation at incisions and mild lateral breast edema present. One axillary cord present deep in axilla.  POSTURE:  Forward head, rounded shoulders  LYMPHEDEMA ASSESSMENT:   UPPER EXTREMITY AROM/PROM:   A/PROM RIGHT   Eval Addendum - added after eval 11/24/23   RIGHT 01/12/2024  Shoulder extension 70 59  Shoulder flexion 153 144  Shoulder abduction 175 150  Shoulder internal rotation 80 72  Shoulder external rotation 75 82                           (Blank rows = not tested)   A/PROM LEFT   Eval Addendum - added after eval 11/24/23  Shoulder extension 58  Shoulder flexion 163  Shoulder abduction 170  Shoulder internal rotation 75  Shoulder external rotation 95                          (Blank rows = not tested)   UPPER EXTREMITY STRENGTH: WFL   LYMPHEDEMA ASSESSMENTS (in cm):    LANDMARK RIGHT   Eval Addendum - added after eval 11/24/23 RIGHT 01/12/2024  10 cm proximal to olecranon process 33.7 32.6  Olecranon process 27.6 27.2  10 cm proximal to ulnar styloid process 21.8 21.9  Just proximal to ulnar styloid process 16.1 14.6  Across hand at thumb web space 19.5 18.2  At base of 2nd digit 6.2 5.8  (Blank rows = not tested)   LANDMARK LEFT   Eval Addendum - added after eval 11/24/23 LEFT 01/12/2024  10 cm proximal to olecranon process 35.2 35.1  Olecranon process 30 27.3  10 cm proximal to ulnar styloid process 22.5 22.8  Just proximal to ulnar styloid process 16.2 14.7  Across hand at thumb web space 20.2 17.9  At base of 2nd digit 6.5 6  (Blank rows = not tested)  Surgery type/Date: 12/14/2023 right lumpectomy and sentinel node biopsy Number of lymph nodes removed: 2 Current/past treatment (chemo, radiation, hormone therapy): none Other symptoms:  Heaviness/tightness No Pain No Pitting edema No Infections No Decreased scar mobility Yes Stemmer sign No   TODAY'S TREATMENT: 01/17/24: Therapeutic Exercise Pulleys into flex x 2 mins and abd/scaption x 4 mins with tactile an dVC's during to decrease Rt scapular compensation Roll yellow ball up wall into flex and then Rt abd x 10 each  Therapeutic Activities Supine over half roll for following: Bil UE horz abd x 10, bil UE scaption into a V x 10, and then bil UE abd in a snow angel x 10, 5 sec holds returning therapist demo for each Manual Therapy P/ROM to Rt shoulder into flex, abd and D2 with scapular depression throughout by  therapist MFR to cording in Rt axilla, cords are still palpable and deep  01/12/24: Manual: Soft tissue mobilization to right anterior knee with biofreeze in supine. Myofascial release to right axilla for cording. Ther Exer:  NuStep level 4 (legs only), LEs 7 x7 min while monitoring pt's pain Right terminal knee extension against purple ball on wall x15 with moderate (unchanged)  pain  PATIENT EDUCATION:  Education details: Reviewed HEP for post op breast cancer; instructed with scar massage for breast and axilla; educated pt briefly on lymphedema risk reduction and encouraged her to watch video (see link in pt instructions) Person educated: Patient Education method: Explanation, demonstration, handout Education comprehension: verbalized understanding and returned demonstration  HOME EXERCISE PROGRAM: Reviewed previously given post op HEP.   ASSESSMENT:  CLINICAL IMPRESSION: Ms. Cathern came in c/o new breast discomfort, redness and warmth that started yesterday along with feelings of a hard ball in her breast when she lays supine. She also reports feeling general achiness. With all these symptoms indicating possible infection and/or seroma sent inbox message to Dr. Aron to update her on changes. Today focused on Rt UE cording as pt reports she feels like she found a second cord and axilla feels tighter. Also progressed A/AA/ROM exercises to promote improved Rt shoulder A/ROM that's been more limited since recent second breast surgery. After session heard back from Dr Dia office that they will see her this afternoon to assess new breast symptoms.  Pt will benefit from skilled therapeutic intervention to improve on the following deficits: Decreased knowledge of precautions, impaired UE functional use, pain, decreased ROM, postural dysfunction.   PT treatment/interventions: ADL/Self care home management, [phone]- PT Re-evaluation, 97110-Therapeutic exercises, 97530- Therapeutic  activity, [id number]- Neuromuscular re-education, [phone]- Self Care, [id number]- Manual therapy, [phone]- Gait training, [phone]- Vasopneumatic device, Patient/Family education, Balance training, Stair training, Taping, Scar mobilization, Moist heat, and heat/ice only for right LE   GOALS: Goals reviewed with patient? Yes  GOALS MET AT BREAST CANCER EVAL:  GOALS Name Target Date Goal status  1 Pt will be able to verbalize understanding of pertinent lymphedema risk reduction practices relevant to her dx specifically related to skin care.  Baseline:  No knowledge Eval Achieved at eval  2 Pt will be able to return demo and/or verbalize understanding of the post op HEP related to regaining shoulder ROM. Baseline:  No knowledge Eval Achieved at eval  3 Pt will be able to verbalize understanding of the importance of viewing the post op After Breast CA Class video for further lymphedema risk reduction education and therapeutic exercise.  Baseline:  No knowledge Eval Achieved at eval   LONG TERM GOALS FOR BREAST CANCER:   GOALS Name Target Date  Goal status  1 Pt will demonstrate she has regained full shoulder ROM and function post operatively compared to baselines.  Baseline: 02/09/2024 Ongoing  2 Patient will improve DASH score to be zero for improved overall UE function. 02/09/2024 INITIAL  3 Patient will verbalize good understanding of lymphedema risk reduction practices 02/09/2024 INITIAL   SHORT TERM GOALS for hamstring strain: Target date: 12/15/23 (revised 01/12/2024 for target date 02/09/2024) Pt to be I with HEP for carry over and continuing recommendations for improved outcomes.   Baseline: Goal status: met 9/30   2.  Pt to demonstrate full ROM of Rt hip without pain for improved gait mechanics to tolerate return to PLOF for walking.  Baseline:  Goal status: partially met 01/12/2024   3.  Pt to demonstrate x10 sit to stands without pain or compensation for improved hip strength for  walking. Baseline:  Goal status: met 9/30   4.  Pt to demonstrate 5/5 bil hip strength without pain for improved ability to transfer from ground to standing to return to gardening Baseline:  Goal status: partially met, knee pain affects transfers; 01/12/2024 not tested due to increased pain     LONG TERM GOALS for hamstring strain: Target date: 01/12/24 (revised 01/12/2024 with new target date of 03/08/2024)   Pt to be I with advanced HEP for carry over and continuing recommendations for improved outcomes.   Baseline:  Goal status: Ongoing 01/12/2024   2.  Pt to report ability to tolerate walking at least 1 mile with no more than 1/10 pain for improved QOL.  Baseline:  Goal status: Ongoing 01/12/2024   3.  Pt to demonstrate improved within age related norms due to improved cadence and mechanics of gait for improved ability to complete grocery errands.  Baseline:  Goal status: Ongoing 01/12/2024   4.  Pt to demonstrate improved 5xSTS to no more than 7s for decreased fall risk.  Baseline:  Goal status: Ongoing (10 sec 01/12/2024)      PLAN:  PT FREQUENCY/DURATION: 2x/week for 8 weeks  PLAN FOR NEXT SESSION: How was MD visit about new breast symptoms? Manual techniques to increase right shoulder flexion and abduction and to address axillary cording. NuStep, right leg strengthening; consider right knee  kinesiotaping for pain? Pt sees Dr. Joane Thursday for Rt knee pain.  [facility]  [address]  [place] [address]  [phone]

## 2024-01-19 ENCOUNTER — Ambulatory Visit (INDEPENDENT_AMBULATORY_CARE_PROVIDER_SITE_OTHER): Admitting: Family Medicine

## 2024-01-19 ENCOUNTER — Ambulatory Visit

## 2024-01-19 ENCOUNTER — Other Ambulatory Visit: Payer: Self-pay

## 2024-01-19 VITALS — BP 146/94 | HR 80

## 2024-01-19 DIAGNOSIS — G8929 Other chronic pain: Secondary | ICD-10-CM | POA: Diagnosis not present

## 2024-01-19 DIAGNOSIS — M6281 Muscle weakness (generalized): Secondary | ICD-10-CM | POA: Diagnosis not present

## 2024-01-19 DIAGNOSIS — M79604 Pain in right leg: Secondary | ICD-10-CM

## 2024-01-19 DIAGNOSIS — Z170 Estrogen receptor positive status [ER+]: Secondary | ICD-10-CM

## 2024-01-19 DIAGNOSIS — R279 Unspecified lack of coordination: Secondary | ICD-10-CM

## 2024-01-19 DIAGNOSIS — R269 Unspecified abnormalities of gait and mobility: Secondary | ICD-10-CM

## 2024-01-19 DIAGNOSIS — M25561 Pain in right knee: Secondary | ICD-10-CM

## 2024-01-19 DIAGNOSIS — R293 Abnormal posture: Secondary | ICD-10-CM

## 2024-01-19 DIAGNOSIS — Z483 Aftercare following surgery for neoplasm: Secondary | ICD-10-CM

## 2024-01-19 DIAGNOSIS — M25611 Stiffness of right shoulder, not elsewhere classified: Secondary | ICD-10-CM

## 2024-01-19 NOTE — Progress Notes (Signed)
   LILLETTE Ileana Collet, PhD, LAT, ATC acting as a scribe for Artist Lloyd, MD.  Amanda Herring is a 68 y.o. female who presents to [facility] at [facility] today for R knee pain ongoing since early 2025. She recalls pulling her hamstring and then started having a pulling sensation along the anterior-medial aspect. Pt locates pain to the anterior and posterior aspect  R Knee swelling: yes Mechanical symptoms: no Aggravates: walking, stairs Treatments tried: IBU, Salon pas, cream  Pertinent review of systems: No fevers or chills  Relevant historical information: History of breast cancer   Exam:  BP (!) 146/94   Pulse 80   SpO2 100%  General: Well Developed, well nourished, and in no acute distress.   MSK: Right knee mild effusion normal-appearing otherwise normal motion.  Tender palpation medial joint line. Stable ligamentous exam.  Positive medial McMurray's test.    Lab and Radiology Results  Procedure: Real-time Ultrasound Guided Injection of right knee joint superior lateral patella space Device: Philips Affiniti 50G/GE Logiq Images permanently stored and available for review in PACS Verbal informed consent obtained.  Discussed risks and benefits of procedure. Warned about infection, bleeding, hyperglycemia damage to structures among others. Patient expresses understanding and agreement Time-out conducted.   Noted no overlying erythema, induration, or other signs of local infection.   Skin prepped in a sterile fashion.   Local anesthesia: Topical Ethyl chloride.   With sterile technique and under real time ultrasound guidance: 40 mg of Kenalog and 2 mL of Marcaine injected into knee joint. Fluid seen entering the joint capsule.   Completed without difficulty   Pain immediately resolved suggesting accurate placement of the medication.   Advised to call if fevers/chills, erythema, induration, drainage, or persistent bleeding.   Images permanently stored  and available for review in the ultrasound unit.  Impression: Technically successful ultrasound guided injection.   X-ray images right knee obtained today personally and independently interpreted. 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 Await formal radiology review    Assessment and Plan: 68 y.o. female with right knee pain.  Pain due to exacerbation of DJD and perhaps a degenerative medial meniscus tear.  Plan for steroid injection and Voltaren gel.  Patient has had some physical therapy already for this but her prior to shifting as she recent was diagnosed with breast cancer and is about to start radiation treatment.   PDMP not reviewed this encounter. Orders Placed This Encounter  Procedures   US  LIMITED JOINT SPACE STRUCTURES LOW RIGHT(NO LINKED CHARGES)    Reason for Exam (SYMPTOM  OR DIAGNOSIS REQUIRED):   right knee pain    Preferred imaging location?:   [facility] Knee AP/LAT W/Sun[place] Right    Standing Status:   Future    Number of Occurrences:   1    Expiration Date:   02/18/2024    Reason for Exam (SYMPTOM  OR DIAGNOSIS REQUIRED):   right knee pain    Preferred imaging location?:   [facility]   No orders of the defined types were placed in this encounter.    Discussed warning signs or symptoms. Please see discharge instructions. Patient expresses understanding.   The above documentation has been reviewed and is accurate and complete Artist Lloyd, M.D.

## 2024-01-19 NOTE — Therapy (Signed)
 OUTPATIENT PHYSICAL THERAPY BREAST CANCER TREATMENT   Patient Name: Amanda Herring MRN: [id number] DOB:11-20-1955, 68 y.o., female Today's Date: 01/19/2024  END OF SESSION:  PT End of Session - 01/19/24 0805     Visit Number 13    Number of Visits 19    Date for Recertification  03/08/24    Authorization Type [employer]    Authorization Time Period Med necessity; no visit limit    Progress Note Due on Visit 20    PT Start Time 0800    PT Stop Time 0857    PT Time Calculation (min) 57 min    Activity Tolerance Patient tolerated treatment well    Behavior During Therapy WFL for tasks assessed/performed          Past Medical History:  Diagnosis Date   Cancer (HCC)    Family history of adverse reaction to anesthesia    per patient her dad had post n/v   Family history of bladder cancer    GERD (gastroesophageal reflux disease)    Hepatitis C    s/p treatment 2012   History of blood transfusion 1981   with delivery w/baby   History of kidney stones    passed stones   Hypertension    Hypothyroidism    Pneumonia    x 1 yrs ago   PONV (postoperative nausea and vomiting)    Past Surgical History:  Procedure Laterality Date   ABDOMINAL HYSTERECTOMY     BREAST BIOPSY Right 11/04/2023   US  RT BREAST BX W LOC DEV 1ST LESION IMG BX SPEC US  GUIDE 11/04/2023 [facility]   BREAST EXCISIONAL BIOPSY Bilateral    1981/1980   BREAST LUMPECTOMY Left    1998   BREAST LUMPECTOMY Right 12/14/2023   Procedure: BREAST LUMPECTOMY;  Surgeon: Aron Shoulders, MD;  Location: [facility] OR;  Service: General;  Laterality: Right;   BREAST SURGERY     augmentation   RE-EXCISION OF BREAST LUMPECTOMY Right 12/29/2023   Procedure: EXCISION, LESION, BREAST;  Surgeon: Aron Shoulders, MD;  Location: [facility] OR;  Service: General;  Laterality: Right;  RE-EXCISION RIGHT BREAST LUMPECTOMY   SENTINEL NODE BIOPSY Right 12/14/2023   Procedure: BIOPSY, LYMPH NODE, SENTINEL;  Surgeon: Aron Shoulders,  MD;  Location: [facility] OR;  Service: General;  Laterality: Right;  SENTINEL LYMPH NODE BIOPSY   Patient Active Problem List   Diagnosis Date Noted   Genetic testing 11/28/2023   Family history of bladder cancer    Malignant neoplasm of upper-outer quadrant of right breast in female, estrogen receptor positive (HCC) 11/11/2023    REFERRING PROVIDER: Dr. Shoulders Aron    REFERRING DIAG: Right breast cancer; RIGHT PES BURSA/RIGHT HAMSTRING STRAIN   THERAPY DIAG:  Muscle weakness (generalized)  Pain in right leg  Unspecified lack of coordination  Abnormal posture  Abnormality of gait and mobility  Malignant neoplasm of upper-outer quadrant of right breast in female, estrogen receptor positive (HCC)  Stiffness of right shoulder, not elsewhere classified  Aftercare following surgery for neoplasm  Rationale for Evaluation and Treatment: Rehabilitation  ONSET DATE: 12/14/2023  SUBJECTIVE:  SUBJECTIVE STATEMENT: I saw the PA at the surgeons office yesterday. She started me on an antibiotic and drained about 10 cc's of fluid off my breast. It felt better after she did that but I can tell it already feels hard again behind the areolar. I see Dr. Aron Monday. I was pretty sore in my shoulder after last session, I think it was the stretching over the foam roll. But I feel fine today.   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 She had a right lumpectomy and sentinel node biopsy (2 negative nodes) on 12/14/2023 and then a re-excision on 12/29/2023 to obtain clear margins. It is ER/PR positive and HER2 negative with a Ki67 of 1%. She had a left lumpectomy in 1998 due to breast cancer at [facility].   PATIENT GOALS:  Reassess how my recovery is  going related to arm function, pain, and swelling. Decrease right knee pain so I can walk more and be more active.  PAIN:  Are you having pain? Yes: NPRS scale: Rt breast feels achy; Rt knee 7/10 Pain location: Right medial knee Pain description: Sharp Aggravating factors: Walking, weightbearing Relieving factors: Doing the PT exercises  PRECAUTIONS: Recent Surgery, right UE Lymphedema risk  RED FLAGS: None   ACTIVITY LEVEL / LEISURE: Only doing exercises given by PT and doing a seated elliptical exercise at home.  OBJECTIVE:   PATIENT SURVEYS:  QUICK DASH:     PATIENT SURVEYS:  LEFS  Extreme difficulty/unable (0), Quite a bit of difficulty (1), Moderate difficulty (2), Little difficulty (3), No difficulty (4) Survey date:  11/17/23 01/12/2024  Any of your usual work, housework or school activities 1 3  2. Usual hobbies, recreational or sporting activities 0 3  3. Getting into/out of the bath 1 4  4. Walking between rooms 1 3  5. Putting on socks/shoes 2 4  6. Squatting  1 3  7. Lifting an object, like a bag of groceries from the floor 1 3  8. Performing light activities around your home 1 4  9. Performing heavy activities around your home 0 3  10. Getting into/out of a car 1 3  11. Walking 2 blocks 0 3  12. Walking 1 mile 0 2  13. Going up/down 10 stairs (1 flight) 1 3  14. Standing for 1 hour 1 3  15.  sitting for 1 hour 4 3  16. Running on even ground 0 2  17. Running on uneven ground 0 1  18. Making sharp turns while running fast 0 1  19. Hopping  0 1  20. Rolling over in bed 3 3  Score total:  18 55 (score significantly increased due to inactivity since breast surgery     FUNCTIONAL TESTS:  5 times sit to stand: 11s no AD lowest table setting at eval; today 01/12/2024, completed in 10 sec 2 minute walk test: 384' no AD at eval; 330' today 01/12/2024; limited by pain   GAIT: Distance walked: TWO MINUTE WALK TEST *individual walks without assistance for 2  minutes and the distance is measured *start timing when the individual is instructed to "Go" *stop timing at 2 minutes *assistive devices can be used but should be kept consistent and  documented from test to test  *if physical assistance is required to walk, this should not be performed * a measuring wheel is helpful to determine distance walked *should be performed at the fastest speed possible  GenderAge in years (# in study)Mean distance in meters Female              18-54 (260)                         200.9 55-59 (23)                           191.0 60-64 (29)                           179.1 (587 feet) 65-69 (22)                           184.2 70-74 (32)                           172.2 75-79 (19)                           157.6 80-85 (32)                           144.1   Female          18-54 (539)                         183.0 55-59 (30)                           176.4 60-64 (48)                           166.4 65-69 (22)                           155.2 70-74 (33)                           145.9 75-79 (14)                           140.9 80-85 (34)                           134.3   Assistive device utilized: None Level of assistance: Complete Independence  Comments: EVAL: trunk flexion, trunk lateral sway for limb advancement, increased step length at Rt with increased pain with attempts to shorten step, decreased cadence 01/12/2024: Slightly flexed posture; very antalgic gait  OBSERVATIONS: Right axillary and breast incisions both have healed well with some moderate scar tissue tightness present. Slightly tender to palpation at incisions and mild lateral breast edema present. One axillary cord present deep in axilla.  POSTURE:  Forward head, rounded shoulders  LYMPHEDEMA ASSESSMENT:   UPPER EXTREMITY AROM/PROM:   A/PROM RIGHT   Eval Addendum - added after eval 11/24/23   RIGHT 01/12/2024  Shoulder extension 70 59  Shoulder flexion 153 144  Shoulder abduction  175 150  Shoulder internal rotation 80 72  Shoulder external rotation 75 82                          (  Blank rows = not tested)   A/PROM LEFT   Eval Addendum - added after eval 11/24/23  Shoulder extension 58  Shoulder flexion 163  Shoulder abduction 170  Shoulder internal rotation 75  Shoulder external rotation 95                          (Blank rows = not tested)   UPPER EXTREMITY STRENGTH: WFL   LYMPHEDEMA ASSESSMENTS (in cm):    LANDMARK RIGHT   Eval Addendum - added after eval 11/24/23 RIGHT 01/12/2024  10 cm proximal to olecranon process 33.7 32.6  Olecranon process 27.6 27.2  10 cm proximal to ulnar styloid process 21.8 21.9  Just proximal to ulnar styloid process 16.1 14.6  Across hand at thumb web space 19.5 18.2  At base of 2nd digit 6.2 5.8  (Blank rows = not tested)   LANDMARK LEFT   Eval Addendum - added after eval 11/24/23 LEFT 01/12/2024  10 cm proximal to olecranon process 35.2 35.1  Olecranon process 30 27.3  10 cm proximal to ulnar styloid process 22.5 22.8  Just proximal to ulnar styloid process 16.2 14.7  Across hand at thumb web space 20.2 17.9  At base of 2nd digit 6.5 6  (Blank rows = not tested)  Surgery type/Date: 12/14/2023 right lumpectomy and sentinel node biopsy Number of lymph nodes removed: 2 Current/past treatment (chemo, radiation, hormone therapy): none Other symptoms:  Heaviness/tightness No Pain No Pitting edema No Infections No Decreased scar mobility Yes Stemmer sign No   TODAY'S TREATMENT: 01/19/24: Therapeutic Exercise Pulleys into flex and abd x 2 mins each with tactile and VC's during to decrease Rt scapular compensation Roll yellow ball up wall into flex and then Rt abd x 10 each, tactile cuing for Rt scapular depression at end stretch Modified Downward dog on wall x 5 reps, 5 sec holds Therapeutic Activities Supine over half roll for following: Bil UE horz abd x 10, bil UE scaption into a V x 10, and then bil UE abd  in a snow angel x 10, 5 sec holds returning therapist demo for each; instructed pt to not push into pain with scaption and abd as she is most limited by pain with these 2 motions Manual Therapy P/ROM to Rt shoulder into flex, abd and D2 with scapular depression throughout by therapist MFR to cording in Rt axilla, cords are still palpable and deep  01/17/24: Therapeutic Exercise Pulleys into flex x 2 mins and abd/scaption x 4 mins with tactile an dVC's during to decrease Rt scapular compensation Roll yellow ball up wall into flex and then Rt abd x 10 each  Therapeutic Activities Supine over half roll for following: Bil UE horz abd x 10, bil UE scaption into a V x 10, and then bil UE abd in a snow angel x 10, 5 sec holds returning therapist demo for each Manual Therapy P/ROM to Rt shoulder into flex, abd and D2 with scapular depression throughout by therapist MFR to cording in Rt axilla, cords are still palpable and deep  01/12/24: Manual: Soft tissue mobilization to right anterior knee with biofreeze in supine. Myofascial release to right axilla for cording. Ther Exer:  NuStep level 4 (legs only), LEs 7 x7 min while monitoring pt's pain Right terminal knee extension against purple ball on wall x15 with moderate (unchanged)  pain  PATIENT EDUCATION:  Education details: Reviewed HEP for post op breast cancer; instructed with scar massage for  breast and axilla; educated pt briefly on lymphedema risk reduction and encouraged her to watch video (see link in pt instructions) Person educated: Patient Education method: Explanation, demonstration, handout Education comprehension: verbalized understanding and returned demonstration  HOME EXERCISE PROGRAM: Reviewed previously given post op HEP.   ASSESSMENT:  CLINICAL IMPRESSION: Pt just started an antibiotic when she saw the surgeon Nov 4 and reports breast still red and starting to feel hard again. Pt wanted to cont with focus on Rt shoulder  ROM and hold on the knee until she sees Dr. Joane later today as her pain is increased today. Continued with AA/ROM exercises and reminded pt not to push into pain at end motions. She reports the stretches felt better on the half foam roll today.   Pt will benefit from skilled therapeutic intervention to improve on the following deficits: Decreased knowledge of precautions, impaired UE functional use, pain, decreased ROM, postural dysfunction.   PT treatment/interventions: ADL/Self care home management, [phone]- PT Re-evaluation, 97110-Therapeutic exercises, 97530- Therapeutic activity, [id number]- Neuromuscular re-education, [phone]- Self Care, [id number]- Manual therapy, [phone]- Gait training, [phone]- Vasopneumatic device, Patient/Family education, Balance training, Stair training, Taping, Scar mobilization, Moist heat, and heat/ice only for right LE   GOALS: Goals reviewed with patient? Yes  GOALS MET AT BREAST CANCER EVAL:  GOALS Name Target Date Goal status  1 Pt will be able to verbalize understanding of pertinent lymphedema risk reduction practices relevant to her dx specifically related to skin care.  Baseline:  No knowledge Eval Achieved at eval  2 Pt will be able to return demo and/or verbalize understanding of the post op HEP related to regaining shoulder ROM. Baseline:  No knowledge Eval Achieved at eval  3 Pt will be able to verbalize understanding of the importance of viewing the post op After Breast CA Class video for further lymphedema risk reduction education and therapeutic exercise.  Baseline:  No knowledge Eval Achieved at eval   LONG TERM GOALS FOR BREAST CANCER:   GOALS Name Target Date  Goal status  1 Pt will demonstrate she has regained full shoulder ROM and function post operatively compared to baselines.  Baseline: 02/09/2024 Ongoing  2 Patient will improve DASH score to be zero for improved overall UE function. 02/09/2024 INITIAL  3 Patient will verbalize good understanding of  lymphedema risk reduction practices 02/09/2024 INITIAL   SHORT TERM GOALS for hamstring strain: Target date: 12/15/23 (revised 01/12/2024 for target date 02/09/2024) Pt to be I with HEP for carry over and continuing recommendations for improved outcomes.   Baseline: Goal status: met 9/30   2.  Pt to demonstrate full ROM of Rt hip without pain for improved gait mechanics to tolerate return to PLOF for walking.  Baseline:  Goal status: partially met 01/12/2024   3.  Pt to demonstrate x10 sit to stands without pain or compensation for improved hip strength for walking. Baseline:  Goal status: met 9/30   4.  Pt to demonstrate 5/5 bil hip strength without pain for improved ability to transfer from ground to standing to return to gardening Baseline:  Goal status: partially met, knee pain affects transfers; 01/12/2024 not tested due to increased pain     LONG TERM GOALS for hamstring strain: Target date: 01/12/24 (revised 01/12/2024 with new target date of 03/08/2024)   Pt to be I with advanced HEP for carry over and continuing recommendations for improved outcomes.   Baseline:  Goal status: Ongoing 01/12/2024   2.  Pt to report ability  to tolerate walking at least 1 mile with no more than 1/10 pain for improved QOL.  Baseline:  Goal status: Ongoing 01/12/2024   3.  Pt to demonstrate improved within age related norms due to improved cadence and mechanics of gait for improved ability to complete grocery errands.  Baseline:  Goal status: Ongoing 01/12/2024   4.  Pt to demonstrate improved 5xSTS to no more than 7s for decreased fall risk.  Baseline:  Goal status: Ongoing (10 sec 01/12/2024)      PLAN:  PT FREQUENCY/DURATION: 2x/week for 8 weeks  PLAN FOR NEXT SESSION: How was check up with Dr. Aron? Manual techniques to increase right shoulder flexion and abduction and to address axillary cording. NuStep, right leg strengthening; consider right knee kinesiotaping for pain? Pt  sees Dr. Joane Thursday for Rt knee pain.  [facility]  [address], [address]  [place] [address]  [phone]

## 2024-01-19 NOTE — Patient Instructions (Addendum)
 Thank you for coming in today.   You received an injection today. Seek immediate medical attention if the joint becomes red, extremely painful, or is oozing fluid.   Please use Voltaren gel (Generic Diclofenac Gel) up to 4x daily for pain as needed.  This is available over-the-counter as both the name brand Voltaren gel and the generic diclofenac gel.   Please get an Xray today before you leave

## 2024-01-23 NOTE — Progress Notes (Signed)
 Radiation Oncology         [phone] ________________________________  Name: Amanda Herring        MRN: [id number]  Date of Service: 01/26/2024 DOB: [birth year]  RR:Yjbzd, Anthony RAMAN, FNP  Loretha Ash, MD     REFERRING PHYSICIAN: Loretha Ash, MD   DIAGNOSIS: The encounter diagnosis was Malignant neoplasm of upper-outer quadrant of right breast in female, estrogen receptor positive (HCC).   HISTORY OF PRESENT ILLNESS: Amanda Herring is a 67 y.o. female originally seen in the multidisciplinary breast clinic for a new diagnosis of right breast cancer. 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 She has bilateral breast implants. She recently noted a palpable mass in the right breast and proceeded with diagnostic work up that showed a mass in the right breast. By ultrasound on 11/04/23 this was located in the 12:00 position measuring up to 1.9 cm, and her axilla was negative for adenopathy. 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    Since her last visit, the patient underwent right lumpectomy with sentinel lymph node biopsy on 12/14/2023 with Dr. Aron.  This showed a grade 2 invasive lobular carcinoma measuring 2.8 cm.  Her tumor involved the anterior and posterior margin.  2 sentinel lymph nodes were evaluated and both were negative for disease.  An Oncotype DX score was performed on her surgical specimen and the recurrence score result was 12.  Dr. Loretha does not recommend chemotherapy based on this score.  She underwent reexcision of her margins on 12/29/2023, benign breast tissue was noted in the anterior and posterior specimens, negative for residual disease. She was treated for a postoperative cellulitis and did have aspiration of a seroma but has been improving since.   She is seen to discuss adjuvant radiotherapy to the right breast.  PREVIOUS RADIATION THERAPY: No   PAST MEDICAL HISTORY:  Past Medical History:  Diagnosis Date   Cancer [facility])    Family history of adverse reaction to anesthesia    per patient her dad had post n/v   Family history of bladder cancer    GERD (gastroesophageal reflux disease)    Hepatitis C    s/p treatment 2012   History of blood transfusion 1981   with delivery w/baby   History of kidney stones    passed stones   Hypertension    Hypothyroidism    Pneumonia    x 1 yrs ago   PONV (postoperative nausea and vomiting)        PAST SURGICAL HISTORY: Past Surgical History:  Procedure Laterality Date   ABDOMINAL HYSTERECTOMY     BREAST BIOPSY Right 11/04/2023   US  RT BREAST BX W LOC DEV 1ST LESION IMG BX SPEC US  GUIDE 11/04/2023 [facility] MAMMOGRAPHY   BREAST EXCISIONAL BIOPSY Bilateral    1981/1980   BREAST LUMPECTOMY Left    1998   BREAST LUMPECTOMY Right 12/14/2023   Procedure: BREAST LUMPECTOMY;  Surgeon: Aron Shoulders, MD;  Location: [facility] OR;  Service: General;  Laterality: Right;   BREAST SURGERY     augmentation   RE-EXCISION OF BREAST LUMPECTOMY Right 12/29/2023   Procedure: EXCISION, LESION, BREAST;  Surgeon: Aron Shoulders, MD;  Location: [facility] OR;  Service: General;  Laterality: Right;  RE-EXCISION RIGHT BREAST LUMPECTOMY  SENTINEL NODE BIOPSY Right 12/14/2023   Procedure: BIOPSY, LYMPH NODE, SENTINEL;  Surgeon: Aron Shoulders, MD;  Location: [facility] OR;  Service: General;  Laterality: Right;  SENTINEL LYMPH NODE BIOPSY     FAMILY HISTORY:  Family History  Problem Relation Age of Onset   Skin cancer Father    Skin cancer Brother    Bladder Cancer Maternal Uncle    Breast cancer Neg Hx      SOCIAL HISTORY:  reports that she has never smoked. She has never used smokeless tobacco. She reports that she does not drink alcohol and does not use drugs. The patient is divorced and lives in [place]. She is  accompanied by her daughter. She is retired from being an print production planner in health care. She enjoys keeping her 48 month old grand daughter most days of the week.    ALLERGIES: Codeine and Dilaudid  [hydromorphone  hcl]   MEDICATIONS:  Current Outpatient Medications  Medication Sig Dispense Refill   ascorbic acid (VITAMIN C) 500 MG tablet Take 500 mg by mouth daily.     B Complex-C (SUPER B COMPLEX PO) Take 1 tablet by mouth daily.     Boswellia-Glucosamine-Vit D (OSTEO BI-FLEX ONE PER DAY PO) Take 1 tablet by mouth daily.     Calcium-Vitamin D (CALTRATE 600 PLUS-VIT D PO) Take 1 tablet by mouth daily.     cetirizine (ZYRTEC) 10 MG tablet Take 10 mg by mouth in the morning.     famotidine (PEPCID) 20 MG tablet Take 20 mg by mouth daily as needed for heartburn or indigestion.     hydrochlorothiazide (HYDRODIURIL) 25 MG tablet Take 12.5 mg by mouth daily.     ibuprofen (ADVIL,MOTRIN) 200 MG tablet Take 600 mg by mouth every 6 (six) hours as needed for moderate pain.     levothyroxine (SYNTHROID) 50 MCG tablet Take 50 mcg by mouth every morning.     Multiple Vitamins-Minerals (CENTRUM SILVER ADULT 50+ PO) Take 1 tablet by mouth every other day.     Omega-3 Fatty Acids (FISH OIL) 1000 MG CAPS Take 1,000 mg by mouth daily.     oxyCODONE  (OXY IR/ROXICODONE ) 5 MG immediate release tablet Take 1 tablet (5 mg total) by mouth every 6 (six) hours as needed for severe pain (pain score 7-10). (Patient not taking: Reported on 01/19/2024) 10 tablet 0   POTASSIUM PO Take 550 mg by mouth every other day.     zolpidem (AMBIEN) 10 MG tablet Take 5-10 mg by mouth at bedtime.     No current facility-administered medications for this visit.     REVIEW OF SYSTEMS: On review of systems, the patient reports that she is doing well overall with some fullness in the breast area but overall improvement in size and prior redness and heat about the breast since being on antibiotics. No other complaints are verbalized.       PHYSICAL EXAM:  Wt Readings from Last 3 Encounters:  01/12/24 224 lb 8 oz (101.8 kg)  12/29/23 221 lb (100.2 kg)  12/14/23 221 lb (100.2 kg)   Temp Readings from Last 3 Encounters:  01/12/24 97.7 F (36.5 C) (Temporal)  12/29/23 (!) 97.5 F (36.4 C)  12/14/23 98 F (36.7 C)   BP Readings from Last 3 Encounters:  01/19/24 (!) 146/94  01/12/24 137/78  12/29/23 127/68   Pulse Readings from Last 3 Encounters:  01/19/24 80  01/12/24 65  12/29/23 74    In general this is a well appearing caucasian female  in no acute distress. She's alert and oriented x4 and appropriate throughout the examination. Cardiopulmonary assessment is negative for acute distress and she exhibits normal effort.  Her right breast incision site is healing well without erythema, separation or drainage.The prior erythema described has resolved with minimal persistent capillary prominence in the periareolar breast.  Her in situ implant is also noted.     ECOG = 1  0 - Asymptomatic (Fully active, able to carry on all predisease activities without restriction)  1 - Symptomatic but completely ambulatory (Restricted in physically strenuous activity but ambulatory and able to carry out work of a light or sedentary nature. For example, light housework, office work)  2 - Symptomatic, <50% in bed during the day (Ambulatory and capable of all self care but unable to carry out any work activities. Up and about more than 50% of waking hours)  3 - Symptomatic, >50% in bed, but not bedbound (Capable of only limited self-care, confined to bed or chair 50% or more of waking hours)  4 - Bedbound (Completely disabled. Cannot carry on any self-care. Totally confined to bed or chair)  5 - Death   Amanda Herring, Creech RH, Tormey DC, et al. [phone]). Toxicity and response criteria of the [facility]. Am. Amanda Herring    LABORATORY DATA:  Lab Results  Component Value Date   WBC 5.5  12/29/2023   HGB 13.7 12/29/2023   HCT 40.6 12/29/2023   MCV 92.9 12/29/2023   PLT 197 12/29/2023   Lab Results  Component Value Date   NA 138 12/29/2023   K 3.5 12/29/2023   CL 104 12/29/2023   CO2 22 12/29/2023   Lab Results  Component Value Date   ALT 18 12/29/2023   AST 24 12/29/2023   ALKPHOS 41 12/29/2023   BILITOT 1.2 12/29/2023      RADIOGRAPHY: No results found.      IMPRESSION/PLAN: 1. Stage IA, pT2N0M0 grade 2 ER/PR positive invasive lobular carcinoma of the right breast. Dr. Dewey discusses the pathology findings and reviews the nature of early stage right breast disease.  The patient has done well since her surgeries, and is pleased that she does not need chemotherapy.  Dr. Dewey recommends external radiotherapy to the breast  to reduce risks of local recurrence. Dr. Loretha anticipates adjuvant antiestrogen therapy to follow. We discussed the risks, benefits, short, and long term effects of radiotherapy, as well as the curative intent, and the patient is interested in proceeding.  I reviewed the delivery and logistics of radiotherapy and that Dr. Dewey recommends 6 1/2 weeks of radiotherapy to the right breast given her in situ implants. Written consent is obtained and placed in the chart, a copy was provided to the patient. She will simulate tomorrow.  2. In Situ Breast Implants. Given the history of breast augmentation, we discussed the possibility of implant failure following radiotherapy and as above would treat over 6 1/2 weeks to reduce risks of this.  3. History of left breast DCIS. The patient will continue follow up for this when she begins surveillance for #1.  Since her  The above documentation reflects my direct findings during this shared patient visit. Please see the separate note by Dr. Dewey on this date for the remainder of the patient's plan of care.    Amanda Herring, [facility]    **Disclaimer: This note was dictated with voice recognition software.  Similar sounding words can inadvertently be transcribed and  this note may contain transcription errors which may not have been corrected upon publication of note.**

## 2024-01-25 ENCOUNTER — Ambulatory Visit: Payer: Self-pay | Admitting: Family Medicine

## 2024-01-25 ENCOUNTER — Telehealth: Payer: Self-pay | Admitting: Radiation Oncology

## 2024-01-25 ENCOUNTER — Ambulatory Visit

## 2024-01-25 ENCOUNTER — Encounter: Payer: Self-pay | Admitting: Radiation Oncology

## 2024-01-25 DIAGNOSIS — M79604 Pain in right leg: Secondary | ICD-10-CM

## 2024-01-25 DIAGNOSIS — M6281 Muscle weakness (generalized): Secondary | ICD-10-CM | POA: Diagnosis not present

## 2024-01-25 DIAGNOSIS — R293 Abnormal posture: Secondary | ICD-10-CM

## 2024-01-25 DIAGNOSIS — Z170 Estrogen receptor positive status [ER+]: Secondary | ICD-10-CM

## 2024-01-25 DIAGNOSIS — Z483 Aftercare following surgery for neoplasm: Secondary | ICD-10-CM

## 2024-01-25 DIAGNOSIS — R279 Unspecified lack of coordination: Secondary | ICD-10-CM

## 2024-01-25 DIAGNOSIS — R269 Unspecified abnormalities of gait and mobility: Secondary | ICD-10-CM

## 2024-01-25 DIAGNOSIS — M25611 Stiffness of right shoulder, not elsewhere classified: Secondary | ICD-10-CM

## 2024-01-25 NOTE — Therapy (Signed)
 OUTPATIENT PHYSICAL THERAPY BREAST CANCER TREATMENT   Patient Name: Amanda Herring MRN: [id number] DOB:1955-06-14, 68 y.o., female Today's Date: 01/25/2024  END OF SESSION:  PT End of Session - 01/25/24 0805     Visit Number 14    Number of Visits 19    Date for Recertification  03/08/24    Authorization Type [employer]    Authorization Time Period Med necessity; no visit limit    Progress Note Due on Visit 20    PT Start Time 0801    PT Stop Time 0857    PT Time Calculation (min) 56 min    Activity Tolerance Patient tolerated treatment well    Behavior During Therapy WFL for tasks assessed/performed          Past Medical History:  Diagnosis Date   Cancer (HCC)    Family history of adverse reaction to anesthesia    per patient her dad had post n/v   Family history of bladder cancer    GERD (gastroesophageal reflux disease)    Hepatitis C    s/p treatment 2012   History of blood transfusion 1981   with delivery w/baby   History of kidney stones    passed stones   Hypertension    Hypothyroidism    Pneumonia    x 1 yrs ago   PONV (postoperative nausea and vomiting)    Past Surgical History:  Procedure Laterality Date   ABDOMINAL HYSTERECTOMY     BREAST BIOPSY Right 11/04/2023   US  RT BREAST BX W LOC DEV 1ST LESION IMG BX SPEC US  GUIDE 11/04/2023 [facility]   BREAST EXCISIONAL BIOPSY Bilateral    1981/1980   BREAST LUMPECTOMY Left    1998   BREAST LUMPECTOMY Right 12/14/2023   Procedure: BREAST LUMPECTOMY;  Surgeon: Aron Shoulders, MD;  Location: [facility] OR;  Service: General;  Laterality: Right;   BREAST SURGERY     augmentation   RE-EXCISION OF BREAST LUMPECTOMY Right 12/29/2023   Procedure: EXCISION, LESION, BREAST;  Surgeon: Aron Shoulders, MD;  Location: [facility] OR;  Service: General;  Laterality: Right;  RE-EXCISION RIGHT BREAST LUMPECTOMY   SENTINEL NODE BIOPSY Right 12/14/2023   Procedure: BIOPSY, LYMPH NODE, SENTINEL;  Surgeon: Aron Shoulders,  MD;  Location: [facility] OR;  Service: General;  Laterality: Right;  SENTINEL LYMPH NODE BIOPSY   Patient Active Problem List   Diagnosis Date Noted   Genetic testing 11/28/2023   Family history of bladder cancer    Malignant neoplasm of upper-outer quadrant of right breast in female, estrogen receptor positive (HCC) 11/11/2023    REFERRING PROVIDER: Dr. Shoulders Aron    REFERRING DIAG: Right breast cancer; RIGHT PES BURSA/RIGHT HAMSTRING STRAIN   THERAPY DIAG:  Muscle weakness (generalized)  Pain in right leg  Unspecified lack of coordination  Abnormal posture  Abnormality of gait and mobility  Malignant neoplasm of upper-outer quadrant of right breast in female, estrogen receptor positive (HCC)  Stiffness of right shoulder, not elsewhere classified  Aftercare following surgery for neoplasm  Rationale for Evaluation and Treatment: Rehabilitation  ONSET DATE: 12/14/2023  SUBJECTIVE:  SUBJECTIVE STATEMENT: Dr. Aron saw me Monday and she still didn't like the amount of redness on my Rt breast an the firmness at the bottom and behind the areolar so she extended the antibiotic x 7 more days. Dr. Joane gave me a shot in my Rt knee. He did an xray as well and I just got those results this morning that concur that it was arthritis. I have my radiation simulation Friday morning before I come back here. Today my Rt breast just feels really tender. My Rt knee is feeling better since the shot and I'm walking better but I still get the grabbing pain occasionally. I've continued with the stretches that y'all gave me and those really do help.   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 She had a right lumpectomy and sentinel node biopsy (2  negative nodes) on 12/14/2023 and then a re-excision on 12/29/2023 to obtain clear margins. It is ER/PR positive and HER2 negative with a Ki67 of 1%. She had a left lumpectomy in 1998 due to breast cancer at [facility].   PATIENT GOALS:  Reassess how my recovery is going related to arm function, pain, and swelling. Decrease right knee pain so I can walk more and be more active.  PAIN:  Are you having pain? Yes: NPRS scale: Rt breast feels achy; Rt knee 3/10 Pain location: Right medial knee Pain description: dull, achy with intermittent sharp pains Aggravating factors: Walking, weightbearing Relieving factors: the short helped, Doing the PT exercises  PRECAUTIONS: Recent Surgery, right UE Lymphedema risk  RED FLAGS: None   ACTIVITY LEVEL / LEISURE: Only doing exercises given by PT and doing a seated elliptical exercise at home.  OBJECTIVE:   PATIENT SURVEYS:  QUICK DASH:     PATIENT SURVEYS:  LEFS  Extreme difficulty/unable (0), Quite a bit of difficulty (1), Moderate difficulty (2), Little difficulty (3), No difficulty (4) Survey date:  11/17/23 01/12/2024  Any of your usual work, housework or school activities 1 3  2. Usual hobbies, recreational or sporting activities 0 3  3. Getting into/out of the bath 1 4  4. Walking between rooms 1 3  5. Putting on socks/shoes 2 4  6. Squatting  1 3  7. Lifting an object, like a bag of groceries from the floor 1 3  8. Performing light activities around your home 1 4  9. Performing heavy activities around your home 0 3  10. Getting into/out of a car 1 3  11. Walking 2 blocks 0 3  12. Walking 1 mile 0 2  13. Going up/down 10 stairs (1 flight) 1 3  14. Standing for 1 hour 1 3  15.  sitting for 1 hour 4 3  16. Running on even ground 0 2  17. Running on uneven ground 0 1  18. Making sharp turns while running fast 0 1  19. Hopping  0 1  20. Rolling over in bed 3 3  Score total:  18 55 (score significantly increased due to inactivity since  breast surgery     FUNCTIONAL TESTS:  5 times sit to stand: 11s no AD lowest table setting at eval; today 01/12/2024, completed in 10 sec 2 minute walk test: 384' no AD at eval; 330' today 01/12/2024; limited by pain   GAIT: Distance walked: TWO MINUTE WALK TEST *individual walks without assistance for 2 minutes and the distance is measured *start timing when the individual is  instructed to "Go" *stop timing at 2 minutes *assistive devices can be used but should be kept consistent and  documented from test to test  *if physical assistance is required to walk, this should not be performed * a measuring wheel is helpful to determine distance walked *should be performed at the fastest speed possible   GenderAge in years (# in study)Mean distance in meters Female              18-54 (260)                         200.9 55-59 (23)                           191.0 60-64 (29)                           179.1 (587 feet) 65-69 (22)                           184.2 70-74 (32)                           172.2 75-79 (19)                           157.6 80-85 (32)                           144.1   Female          18-54 (539)                         183.0 55-59 (30)                           176.4 60-64 (48)                           166.4 65-69 (22)                           155.2 70-74 (33)                           145.9 75-79 (14)                           140.9 80-85 (34)                           134.3   Assistive device utilized: None Level of assistance: Complete Independence  Comments: EVAL: trunk flexion, trunk lateral sway for limb advancement, increased step length at Rt with increased pain with attempts to shorten step, decreased cadence 01/12/2024: Slightly flexed posture; very antalgic gait  OBSERVATIONS: Right axillary and breast incisions both have healed well with some moderate scar tissue tightness present. Slightly tender to palpation at incisions and mild lateral breast edema  present. One axillary cord present deep in axilla.  POSTURE:  Forward head, rounded shoulders  LYMPHEDEMA ASSESSMENT:   UPPER EXTREMITY AROM/PROM:   A/PROM RIGHT  Eval Addendum - added after eval 11/24/23   RIGHT 01/12/2024  Shoulder extension 70 59  Shoulder flexion 153 144  Shoulder abduction 175 150  Shoulder internal rotation 80 72  Shoulder external rotation 75 82                          (Blank rows = not tested)   A/PROM LEFT   Eval Addendum - added after eval 11/24/23  Shoulder extension 58  Shoulder flexion 163  Shoulder abduction 170  Shoulder internal rotation 75  Shoulder external rotation 95                          (Blank rows = not tested)   UPPER EXTREMITY STRENGTH: WFL   LYMPHEDEMA ASSESSMENTS (in cm):    LANDMARK RIGHT   Eval Addendum - added after eval 11/24/23 RIGHT 01/12/2024  10 cm proximal to olecranon process 33.7 32.6  Olecranon process 27.6 27.2  10 cm proximal to ulnar styloid process 21.8 21.9  Just proximal to ulnar styloid process 16.1 14.6  Across hand at thumb web space 19.5 18.2  At base of 2nd digit 6.2 5.8  (Blank rows = not tested)   LANDMARK LEFT   Eval Addendum - added after eval 11/24/23 LEFT 01/12/2024  10 cm proximal to olecranon process 35.2 35.1  Olecranon process 30 27.3  10 cm proximal to ulnar styloid process 22.5 22.8  Just proximal to ulnar styloid process 16.2 14.7  Across hand at thumb web space 20.2 17.9  At base of 2nd digit 6.5 6  (Blank rows = not tested)  Surgery type/Date: 12/14/2023 right lumpectomy and sentinel node biopsy Number of lymph nodes removed: 2 Current/past treatment (chemo, radiation, hormone therapy): none Other symptoms:  Heaviness/tightness No Pain No Pitting edema No Infections No Decreased scar mobility Yes Stemmer sign No   TODAY'S TREATMENT: 01/25/24: Therapeutic Exercise Pulleys into flex and abd x 2 mins each with tactile and VC's during to decrease Rt scapular  compensation Roll yellow ball up wall into flex and then Rt abd x 10 each, tactile cuing for Rt scapular depression at end stretch Modified Downward dog on wall x 5 reps, 5 sec holds Therapeutic Activities Free Motion Machine for following: 7# scap retract 2 x 10 with short rest break between sets for muscle recovery, then bil Ue ext with machine arms elevated to 5, 3# x 10 returning therapist for each and VC's during for core engagement and to depress scapulae throughout as pt tends to elevate bil UT's Supine over half roll for following: Bil UE horz abd x 10, bil UE scaption into a V x 10, and then bil UE abd in a snow angel x 10, 5 sec holds returning therapist demo for each, pt did not have pain with these motions today as she did at last session Manual Therapy P/ROM to Rt shoulder into flex, abd and D2 with scapular depression throughout by therapist MFR to cording in Rt axilla, cording was less palpable and visible today   01/19/24: Therapeutic Exercise Pulleys into flex and abd x 2 mins each with tactile and VC's during to decrease Rt scapular compensation Roll yellow ball up wall into flex and then Rt abd x 10 each, tactile cuing for Rt scapular depression at end stretch Modified Downward dog on wall x 5 reps, 5 sec holds Therapeutic Activities Supine over half roll for following: Bil UE  horz abd x 10, bil UE scaption into a V x 10, and then bil UE abd in a snow angel x 10, 5 sec holds returning therapist demo for each; instructed pt to not push into pain with scaption and abd as she is most limited by pain with these 2 motions Manual Therapy P/ROM to Rt shoulder into flex, abd and D2 with scapular depression throughout by therapist MFR to cording in Rt axilla, cords are still palpable and deep  01/17/24: Therapeutic Exercise Pulleys into flex x 2 mins and abd/scaption x 4 mins with tactile an dVC's during to decrease Rt scapular compensation Roll yellow ball up wall into flex and  then Rt abd x 10 each  Therapeutic Activities Supine over half roll for following: Bil UE horz abd x 10, bil UE scaption into a V x 10, and then bil UE abd in a snow angel x 10, 5 sec holds returning therapist demo for each Manual Therapy P/ROM to Rt shoulder into flex, abd and D2 with scapular depression throughout by therapist MFR to cording in Rt axilla, cords are still palpable and deep     PATIENT EDUCATION:  Education details: Reviewed HEP for post op breast cancer; instructed with scar massage for breast and axilla; educated pt briefly on lymphedema risk reduction and encouraged her to watch video (see link in pt instructions) Person educated: Patient Education method: Explanation, demonstration, handout Education comprehension: verbalized understanding and returned demonstration  HOME EXERCISE PROGRAM: Reviewed previously given post op HEP.   ASSESSMENT:  CLINICAL IMPRESSION: Dr. Aron extended the antibiotic for pt due to her Rt breast redness, though better, still present. She is due to have her radiation simulation Friday. And Dr. Joane said arthritis is issue with her Rt knee. Today continued with AA/A/ROM stretches and added gentle postural strength with cables. Pt tolerated this well. Also continued with manual therapy working to further decrease facial restrictions from cording and residual, lingering post op edema which is to be expected due to 2 consecutive surgeries.   Pt will benefit from skilled therapeutic intervention to improve on the following deficits: Decreased knowledge of precautions, impaired UE functional use, pain, decreased ROM, postural dysfunction.   PT treatment/interventions: ADL/Self care home management, [phone]- PT Re-evaluation, 97110-Therapeutic exercises, 97530- Therapeutic activity, [id number]- Neuromuscular re-education, [phone]- Self Care, [id number]- Manual therapy, [phone]- Gait training, [phone]- Vasopneumatic device, Patient/Family education, Balance  training, Stair training, Taping, Scar mobilization, Moist heat, and heat/ice only for right LE   GOALS: Goals reviewed with patient? Yes  GOALS MET AT BREAST CANCER EVAL:  GOALS Name Target Date Goal status  1 Pt will be able to verbalize understanding of pertinent lymphedema risk reduction practices relevant to her dx specifically related to skin care.  Baseline:  No knowledge Eval Achieved at eval  2 Pt will be able to return demo and/or verbalize understanding of the post op HEP related to regaining shoulder ROM. Baseline:  No knowledge Eval Achieved at eval  3 Pt will be able to verbalize understanding of the importance of viewing the post op After Breast CA Class video for further lymphedema risk reduction education and therapeutic exercise.  Baseline:  No knowledge Eval Achieved at eval   LONG TERM GOALS FOR BREAST CANCER:   GOALS Name Target Date  Goal status  1 Pt will demonstrate she has regained full shoulder ROM and function post operatively compared to baselines.  Baseline: 02/09/2024 Ongoing  2 Patient will improve DASH score to be zero for  improved overall UE function. 02/09/2024 INITIAL  3 Patient will verbalize good understanding of lymphedema risk reduction practices 02/09/2024 INITIAL   SHORT TERM GOALS for hamstring strain: Target date: 12/15/23 (revised 01/12/2024 for target date 02/09/2024) Pt to be I with HEP for carry over and continuing recommendations for improved outcomes.   Baseline: Goal status: met 9/30   2.  Pt to demonstrate full ROM of Rt hip without pain for improved gait mechanics to tolerate return to PLOF for walking.  Baseline:  Goal status: partially met 01/12/2024   3.  Pt to demonstrate x10 sit to stands without pain or compensation for improved hip strength for walking. Baseline:  Goal status: met 9/30   4.  Pt to demonstrate 5/5 bil hip strength without pain for improved ability to transfer from ground to standing to return to  gardening Baseline:  Goal status: partially met, knee pain affects transfers; 01/12/2024 not tested due to increased pain     LONG TERM GOALS for hamstring strain: Target date: 01/12/24 (revised 01/12/2024 with new target date of 03/08/2024)   Pt to be I with advanced HEP for carry over and continuing recommendations for improved outcomes.   Baseline:  Goal status: Ongoing 01/12/2024   2.  Pt to report ability to tolerate walking at least 1 mile with no more than 1/10 pain for improved QOL.  Baseline:  Goal status: Ongoing 01/12/2024   3.  Pt to demonstrate improved within age related norms due to improved cadence and mechanics of gait for improved ability to complete grocery errands.  Baseline:  Goal status: Ongoing 01/12/2024   4.  Pt to demonstrate improved 5xSTS to no more than 7s for decreased fall risk.  Baseline:  Goal status: Ongoing (10 sec 01/12/2024)      PLAN:  PT FREQUENCY/DURATION: 2x/week for 8 weeks  PLAN FOR NEXT SESSION: How did radiation simulation go? Manual techniques to increase right shoulder flexion and abduction and to address axillary cording. NuStep, right leg strengthening; consider right knee kinesiotaping for pain?   [facility]  [address], [address]  [place] [address]  [phone]

## 2024-01-25 NOTE — Progress Notes (Signed)
 Right knee x-ray shows some swelling and some arthritis.

## 2024-01-25 NOTE — Telephone Encounter (Signed)
 Received VM from pt requesting c/b to confirm upcoming appt times. C/b to advise pt of correct times, pt verbalized understanding.

## 2024-01-26 ENCOUNTER — Ambulatory Visit
Admission: RE | Admit: 2024-01-26 | Discharge: 2024-01-26 | Disposition: A | Discharge status: Home / Self Care | Source: Ambulatory Visit | Attending: Radiation Oncology | Admitting: Radiation Oncology

## 2024-01-26 VITALS — BP 129/75 | HR 66 | Temp 97.6°F | Resp 20 | Ht 64.0 in | Wt 219.8 lb

## 2024-01-26 DIAGNOSIS — Z1721 Progesterone receptor positive status: Secondary | ICD-10-CM | POA: Diagnosis not present

## 2024-01-26 DIAGNOSIS — Z9882 Breast implant status: Secondary | ICD-10-CM | POA: Insufficient documentation

## 2024-01-26 DIAGNOSIS — Z170 Estrogen receptor positive status [ER+]: Secondary | ICD-10-CM | POA: Diagnosis not present

## 2024-01-26 DIAGNOSIS — Z8052 Family history of malignant neoplasm of bladder: Secondary | ICD-10-CM | POA: Diagnosis not present

## 2024-01-26 DIAGNOSIS — Z79890 Hormone replacement therapy (postmenopausal): Secondary | ICD-10-CM | POA: Diagnosis not present

## 2024-01-26 DIAGNOSIS — E039 Hypothyroidism, unspecified: Secondary | ICD-10-CM | POA: Diagnosis not present

## 2024-01-26 DIAGNOSIS — Z87442 Personal history of urinary calculi: Secondary | ICD-10-CM | POA: Diagnosis not present

## 2024-01-26 DIAGNOSIS — K219 Gastro-esophageal reflux disease without esophagitis: Secondary | ICD-10-CM | POA: Diagnosis not present

## 2024-01-26 DIAGNOSIS — C50411 Malignant neoplasm of upper-outer quadrant of right female breast: Secondary | ICD-10-CM | POA: Insufficient documentation

## 2024-01-26 DIAGNOSIS — Z8701 Personal history of pneumonia (recurrent): Secondary | ICD-10-CM | POA: Diagnosis not present

## 2024-01-26 DIAGNOSIS — Z86000 Personal history of in-situ neoplasm of breast: Secondary | ICD-10-CM | POA: Insufficient documentation

## 2024-01-26 DIAGNOSIS — I10 Essential (primary) hypertension: Secondary | ICD-10-CM | POA: Insufficient documentation

## 2024-01-26 DIAGNOSIS — Z79899 Other long term (current) drug therapy: Secondary | ICD-10-CM | POA: Insufficient documentation

## 2024-01-26 DIAGNOSIS — Z8619 Personal history of other infectious and parasitic diseases: Secondary | ICD-10-CM | POA: Insufficient documentation

## 2024-01-26 DIAGNOSIS — Z171 Estrogen receptor negative status [ER-]: Secondary | ICD-10-CM | POA: Insufficient documentation

## 2024-01-27 ENCOUNTER — Ambulatory Visit

## 2024-01-27 ENCOUNTER — Ambulatory Visit
Admission: RE | Admit: 2024-01-27 | Discharge: 2024-01-27 | Disposition: A | Discharge status: Home / Self Care | Source: Ambulatory Visit | Attending: Radiation Oncology | Admitting: Radiation Oncology

## 2024-01-27 DIAGNOSIS — R269 Unspecified abnormalities of gait and mobility: Secondary | ICD-10-CM

## 2024-01-27 DIAGNOSIS — Z170 Estrogen receptor positive status [ER+]: Secondary | ICD-10-CM | POA: Diagnosis not present

## 2024-01-27 DIAGNOSIS — Z510 Encounter for antineoplastic radiation therapy: Secondary | ICD-10-CM | POA: Diagnosis present

## 2024-01-27 DIAGNOSIS — R279 Unspecified lack of coordination: Secondary | ICD-10-CM

## 2024-01-27 DIAGNOSIS — M6281 Muscle weakness (generalized): Secondary | ICD-10-CM | POA: Diagnosis not present

## 2024-01-27 DIAGNOSIS — C50411 Malignant neoplasm of upper-outer quadrant of right female breast: Secondary | ICD-10-CM | POA: Insufficient documentation

## 2024-01-27 DIAGNOSIS — Z483 Aftercare following surgery for neoplasm: Secondary | ICD-10-CM

## 2024-01-27 DIAGNOSIS — M79604 Pain in right leg: Secondary | ICD-10-CM

## 2024-01-27 DIAGNOSIS — R293 Abnormal posture: Secondary | ICD-10-CM

## 2024-01-27 DIAGNOSIS — M25611 Stiffness of right shoulder, not elsewhere classified: Secondary | ICD-10-CM

## 2024-01-27 NOTE — Therapy (Signed)
 OUTPATIENT PHYSICAL THERAPY BREAST CANCER TREATMENT   Patient Name: Amanda Herring MRN: [id number] DOB:1956/03/10, 68 y.o., female Today's Date: 01/27/2024  END OF SESSION:  PT End of Session - 01/27/24 1105     Visit Number 15    Number of Visits 19    Date for Recertification  03/08/24    Authorization Type [employer]    Authorization Time Period Med necessity; no visit limit    Progress Note Due on Visit 20    PT Start Time 1102    PT Stop Time 1157    PT Time Calculation (min) 55 min    Activity Tolerance Patient tolerated treatment well    Behavior During Therapy WFL for tasks assessed/performed          Past Medical History:  Diagnosis Date   Cancer (HCC)    Family history of adverse reaction to anesthesia    per patient her dad had post n/v   Family history of bladder cancer    GERD (gastroesophageal reflux disease)    Hepatitis C    s/p treatment 2012   History of blood transfusion 1981   with delivery w/baby   History of kidney stones    passed stones   Hypertension    Hypothyroidism    Pneumonia    x 1 yrs ago   PONV (postoperative nausea and vomiting)    Past Surgical History:  Procedure Laterality Date   ABDOMINAL HYSTERECTOMY     BREAST BIOPSY Right 11/04/2023   US  RT BREAST BX W LOC DEV 1ST LESION IMG BX SPEC US  GUIDE 11/04/2023 [facility]   BREAST EXCISIONAL BIOPSY Bilateral    1981/1980   BREAST LUMPECTOMY Left    1998   BREAST LUMPECTOMY Right 12/14/2023   Procedure: BREAST LUMPECTOMY;  Surgeon: Aron Shoulders, MD;  Location: [facility] OR;  Service: General;  Laterality: Right;   BREAST SURGERY     augmentation   RE-EXCISION OF BREAST LUMPECTOMY Right 12/29/2023   Procedure: EXCISION, LESION, BREAST;  Surgeon: Aron Shoulders, MD;  Location: [facility] OR;  Service: General;  Laterality: Right;  RE-EXCISION RIGHT BREAST LUMPECTOMY   SENTINEL NODE BIOPSY Right 12/14/2023   Procedure: BIOPSY, LYMPH NODE, SENTINEL;  Surgeon: Aron Shoulders,  MD;  Location: [facility] OR;  Service: General;  Laterality: Right;  SENTINEL LYMPH NODE BIOPSY   Patient Active Problem List   Diagnosis Date Noted   Genetic testing 11/28/2023   Family history of bladder cancer    Malignant neoplasm of upper-outer quadrant of right breast in female, estrogen receptor positive (HCC) 11/11/2023    REFERRING PROVIDER: Dr. Shoulders Aron    REFERRING DIAG: Right breast cancer; RIGHT PES BURSA/RIGHT HAMSTRING STRAIN   THERAPY DIAG:  Muscle weakness (generalized)  Pain in right leg  Unspecified lack of coordination  Abnormal posture  Abnormality of gait and mobility  Malignant neoplasm of upper-outer quadrant of right breast in female, estrogen receptor positive (HCC)  Stiffness of right shoulder, not elsewhere classified  Aftercare following surgery for neoplasm  Rationale for Evaluation and Treatment: Rehabilitation  ONSET DATE: 12/14/2023  SUBJECTIVE:  SUBJECTIVE STATEMENT: My radiation simulation went well this morning. I only had to be in the mold for about 15 mins. I'll have the boost treatment the last 5 days of my 33  radiations.   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 She had a right lumpectomy and sentinel node biopsy (2 negative nodes) on 12/14/2023 and then a re-excision on 12/29/2023 to obtain clear margins. It is ER/PR positive and HER2 negative with a Ki67 of 1%. She had a left lumpectomy in 1998 due to breast cancer at [facility].   PATIENT GOALS:  Reassess how my recovery is going related to arm function, pain, and swelling. Decrease right knee pain so I can walk more and be more active.  PAIN:  Are you having pain? Yes: NPRS scale: Rt knee 3/10 Pain location: Right medial knee Pain  description: dull, achy with intermittent sharp pains Aggravating factors: Walking, weightbearing Relieving factors: the short helped, Doing the PT exercises  PRECAUTIONS: Recent Surgery, right UE Lymphedema risk  RED FLAGS: None   ACTIVITY LEVEL / LEISURE: Only doing exercises given by PT and doing a seated elliptical exercise at home.  OBJECTIVE:   PATIENT SURVEYS:  QUICK DASH:     PATIENT SURVEYS:  LEFS  Extreme difficulty/unable (0), Quite a bit of difficulty (1), Moderate difficulty (2), Little difficulty (3), No difficulty (4) Survey date:  11/17/23 01/12/2024  Any of your usual work, housework or school activities 1 3  2. Usual hobbies, recreational or sporting activities 0 3  3. Getting into/out of the bath 1 4  4. Walking between rooms 1 3  5. Putting on socks/shoes 2 4  6. Squatting  1 3  7. Lifting an object, like a bag of groceries from the floor 1 3  8. Performing light activities around your home 1 4  9. Performing heavy activities around your home 0 3  10. Getting into/out of a car 1 3  11. Walking 2 blocks 0 3  12. Walking 1 mile 0 2  13. Going up/down 10 stairs (1 flight) 1 3  14. Standing for 1 hour 1 3  15.  sitting for 1 hour 4 3  16. Running on even ground 0 2  17. Running on uneven ground 0 1  18. Making sharp turns while running fast 0 1  19. Hopping  0 1  20. Rolling over in bed 3 3  Score total:  18 55 (score significantly increased due to inactivity since breast surgery     FUNCTIONAL TESTS:  5 times sit to stand: 11s no AD lowest table setting at eval; today 01/12/2024, completed in 10 sec 2 minute walk test: 384' no AD at eval; 330' today 01/12/2024; limited by pain   GAIT: Distance walked: TWO MINUTE WALK TEST *individual walks without assistance for 2 minutes and the distance is measured *start timing when the individual is instructed to "Go" *stop timing at 2 minutes *assistive devices can be used but should be kept consistent and   documented from test to test  *if physical assistance is required to walk, this should not be performed * a measuring wheel is helpful to determine distance walked *should be performed at the fastest speed possible   GenderAge in years (# in study)Mean distance in meters Female              18-54 (260)  200.9 55-59 (23)                           191.0 60-64 (29)                           179.1 (587 feet) 65-69 (22)                           184.2 70-74 (32)                           172.2 75-79 (19)                           157.6 80-85 (32)                           144.1   Female          18-54 (539)                         183.0 55-59 (30)                           176.4 60-64 (48)                           166.4 65-69 (22)                           155.2 70-74 (33)                           145.9 75-79 (14)                           140.9 80-85 (34)                           134.3   Assistive device utilized: None Level of assistance: Complete Independence  Comments: EVAL: trunk flexion, trunk lateral sway for limb advancement, increased step length at Rt with increased pain with attempts to shorten step, decreased cadence 01/12/2024: Slightly flexed posture; very antalgic gait  OBSERVATIONS: Right axillary and breast incisions both have healed well with some moderate scar tissue tightness present. Slightly tender to palpation at incisions and mild lateral breast edema present. One axillary cord present deep in axilla.  POSTURE:  Forward head, rounded shoulders  LYMPHEDEMA ASSESSMENT:   UPPER EXTREMITY AROM/PROM:   A/PROM RIGHT   Eval Addendum - added after eval 11/24/23   RIGHT 01/12/2024  Shoulder extension 70 59  Shoulder flexion 153 144  Shoulder abduction 175 150  Shoulder internal rotation 80 72  Shoulder external rotation 75 82                          (Blank rows = not tested)   A/PROM LEFT   Eval Addendum - added after eval  11/24/23  Shoulder extension 58  Shoulder flexion 163  Shoulder abduction 170  Shoulder internal rotation 75  Shoulder external rotation 95                          (  Blank rows = not tested)   UPPER EXTREMITY STRENGTH: WFL   LYMPHEDEMA ASSESSMENTS (in cm):    LANDMARK RIGHT   Eval Addendum - added after eval 11/24/23 RIGHT 01/12/2024  10 cm proximal to olecranon process 33.7 32.6  Olecranon process 27.6 27.2  10 cm proximal to ulnar styloid process 21.8 21.9  Just proximal to ulnar styloid process 16.1 14.6  Across hand at thumb web space 19.5 18.2  At base of 2nd digit 6.2 5.8  (Blank rows = not tested)   LANDMARK LEFT   Eval Addendum - added after eval 11/24/23 LEFT 01/12/2024  10 cm proximal to olecranon process 35.2 35.1  Olecranon process 30 27.3  10 cm proximal to ulnar styloid process 22.5 22.8  Just proximal to ulnar styloid process 16.2 14.7  Across hand at thumb web space 20.2 17.9  At base of 2nd digit 6.5 6  (Blank rows = not tested)  Surgery type/Date: 12/14/2023 right lumpectomy and sentinel node biopsy Number of lymph nodes removed: 2 Current/past treatment (chemo, radiation, hormone therapy): none Other symptoms:  Heaviness/tightness No Pain No Pitting edema No Infections No Decreased scar mobility Yes Stemmer sign No   TODAY'S TREATMENT: 01/27/24: Therapeutic Exercise Pulleys into flex and abd x 2 mins each with tactile and VC's during to decrease Rt scapular compensation Roll yellow ball up wall into flex and then Rt abd x 10 each, tactile cuing for Rt scapular depression at end stretch Therapeutic Activities Supine over half roll for following: Bil UE horz abd x 10, bil UE scaption into a V x 10, and then bil UE abd in a snow angel x 10, 5 sec holds returning therapist demo for each, pt did not have pain with these motions today as she did at last session Supine Scapular Series with red theraband as follows: Bil horz abd, bil UE er, narrow and  wide grip flexion, and then D2 x 10 each and handout issued Manual Therapy P/ROM to Rt shoulder into flex, abd and D2 with scapular depression throughout by therapist MFR to cording in Rt axilla, cording conts to be less palpable and visible today   01/25/24: Therapeutic Exercise Pulleys into flex and abd x 2 mins each with tactile and VC's during to decrease Rt scapular compensation Roll yellow ball up wall into flex and then Rt abd x 10 each, tactile cuing for Rt scapular depression at end stretch Modified Downward dog on wall x 5 reps, 5 sec holds Therapeutic Activities Free Motion Machine for following: 7# scap retract 2 x 10 with short rest break between sets for muscle recovery, then bil Ue ext with machine arms elevated to 5, 3# x 10 returning therapist for each and VC's during for core engagement and to depress scapulae throughout as pt tends to elevate bil UT's Supine over half roll for following: Bil UE horz abd x 10, bil UE scaption into a V x 10, and then bil UE abd in a snow angel x 10, 5 sec holds returning therapist demo for each, pt did not have pain with these motions today as she did at last session Manual Therapy P/ROM to Rt shoulder into flex, abd and D2 with scapular depression throughout by therapist MFR to cording in Rt axilla, cording was less palpable and visible today   01/19/24: Therapeutic Exercise Pulleys into flex and abd x 2 mins each with tactile and VC's during to decrease Rt scapular compensation Roll yellow ball up wall into flex and then  Rt abd x 10 each, tactile cuing for Rt scapular depression at end stretch Modified Downward dog on wall x 5 reps, 5 sec holds Therapeutic Activities Supine over half roll for following: Bil UE horz abd x 10, bil UE scaption into a V x 10, and then bil UE abd in a snow angel x 10, 5 sec holds returning therapist demo for each; instructed pt to not push into pain with scaption and abd as she is most limited by pain with  these 2 motions Manual Therapy P/ROM to Rt shoulder into flex, abd and D2 with scapular depression throughout by therapist MFR to cording in Rt axilla, cords are still palpable and deep      PATIENT EDUCATION:  Education details: Reviewed HEP for post op breast cancer; instructed with scar massage for breast and axilla; educated pt briefly on lymphedema risk reduction and encouraged her to watch video (see link in pt instructions) Person educated: Patient Education method: Explanation, demonstration, handout Education comprehension: verbalized understanding and returned demonstration  HOME EXERCISE PROGRAM: Reviewed previously given post op HEP.   ASSESSMENT:  CLINICAL IMPRESSION: Pt reports radiation simulation went well having no Rt shoulder ROM issues getting fitted to mold. Today continued with AA/ROM stretches and progressed pt to include supine scapular series with red theraband. She did well with this so added to HEP. Instructed her to only perform these a few days a week as she is getting ready to start radiation next week and we want to be mindful not to overload the nonintact lymphatic system. She verbalized good understanding. Anticipate pt continuing physical therapy as she undergoes radiation as it can commonly increase fascial restrictions and cause limitations to Rt shoulder. We will cont to monitor her A/ROM and tissue in the upcoming weeks of radiation .  Pt will benefit from skilled therapeutic intervention to improve on the following deficits: Decreased knowledge of precautions, impaired UE functional use, pain, decreased ROM, postural dysfunction.   PT treatment/interventions: ADL/Self care home management, [phone]- PT Re-evaluation, 97110-Therapeutic exercises, 97530- Therapeutic activity, [id number]- Neuromuscular re-education, [phone]- Self Care, [id number]- Manual therapy, [phone]- Gait training, [phone]- Vasopneumatic device, Patient/Family education, Balance training, Stair  training, Taping, Scar mobilization, Moist heat, and heat/ice only for right LE   GOALS: Goals reviewed with patient? Yes  GOALS MET AT BREAST CANCER EVAL:  GOALS Name Target Date Goal status  1 Pt will be able to verbalize understanding of pertinent lymphedema risk reduction practices relevant to her dx specifically related to skin care.  Baseline:  No knowledge Eval Achieved at eval  2 Pt will be able to return demo and/or verbalize understanding of the post op HEP related to regaining shoulder ROM. Baseline:  No knowledge Eval Achieved at eval  3 Pt will be able to verbalize understanding of the importance of viewing the post op After Breast CA Class video for further lymphedema risk reduction education and therapeutic exercise.  Baseline:  No knowledge Eval Achieved at eval   LONG TERM GOALS FOR BREAST CANCER:   GOALS Name Target Date  Goal status  1 Pt will demonstrate she has regained full shoulder ROM and function post operatively compared to baselines.  Baseline: 02/09/2024 Ongoing  2 Patient will improve DASH score to be zero for improved overall UE function. 02/09/2024 INITIAL  3 Patient will verbalize good understanding of lymphedema risk reduction practices 02/09/2024 INITIAL   SHORT TERM GOALS for hamstring strain: Target date: 12/15/23 (revised 01/12/2024 for target date 02/09/2024) Pt to be  I with HEP for carry over and continuing recommendations for improved outcomes.   Baseline: Goal status: met 9/30   2.  Pt to demonstrate full ROM of Rt hip without pain for improved gait mechanics to tolerate return to PLOF for walking.  Baseline:  Goal status: partially met 01/12/2024   3.  Pt to demonstrate x10 sit to stands without pain or compensation for improved hip strength for walking. Baseline:  Goal status: met 9/30   4.  Pt to demonstrate 5/5 bil hip strength without pain for improved ability to transfer from ground to standing to return to gardening Baseline:  Goal  status: partially met, knee pain affects transfers; 01/12/2024 not tested due to increased pain     LONG TERM GOALS for hamstring strain: Target date: 01/12/24 (revised 01/12/2024 with new target date of 03/08/2024)   Pt to be I with advanced HEP for carry over and continuing recommendations for improved outcomes.   Baseline:  Goal status: Ongoing 01/12/2024   2.  Pt to report ability to tolerate walking at least 1 mile with no more than 1/10 pain for improved QOL.  Baseline:  Goal status: Ongoing 01/12/2024   3.  Pt to demonstrate improved within age related norms due to improved cadence and mechanics of gait for improved ability to complete grocery errands.  Baseline:  Goal status: Ongoing 01/12/2024   4.  Pt to demonstrate improved 5xSTS to no more than 7s for decreased fall risk.  Baseline:  Goal status: Ongoing (10 sec 01/12/2024)      PLAN:  PT FREQUENCY/DURATION: 2x/week for 8 weeks  PLAN FOR NEXT SESSION: Review supine scapular series, Manual techniques to increase right shoulder flexion and abduction and to address axillary cording. NuStep, right leg strengthening; consider right knee kinesiotaping for pain?   [facility]  [address]  [place] [address]  [phone]   Over Head Pull: Narrow and Wide Grip   [facility] [phone]   On back, knees bent, feet flat, band across thighs, elbows straight but relaxed. Pull hands apart (start). Keeping elbows straight, bring arms up and over head, hands toward floor. Keep pull steady on band. Hold momentarily. Return slowly, keeping pull steady, back to start. Then do same with a wider grip on the band (past shoulder width) Repeat _5-10__ times. Band color __red____   Side Pull: Double Arm   On back, knees bent, feet flat. Arms perpendicular to body, shoulder level, elbows straight but relaxed. Pull arms out to sides, elbows straight. Resistance band comes across collarbones, hands  toward floor. Hold momentarily. Slowly return to starting position. Repeat _5-10__ times. Band color _red____   Sword   On back, knees bent, feet flat, left hand on left hip, right hand above left. Pull right arm DIAGONALLY (hip to shoulder) across chest. Bring right arm along head toward floor. Hold momentarily. Slowly return to starting position. Repeat _5-10__ times. Do with left arm. Band color _red_____   Shoulder Rotation: Double Arm   On back, knees bent, feet flat, elbows tucked at sides, bent 90, hands palms up. Pull hands apart and down toward floor, keeping elbows near sides. Hold momentarily. Slowly return to starting position. Repeat _5-10__ times. Band color __red____

## 2024-01-27 NOTE — Patient Instructions (Addendum)
 SABRA

## 2024-01-30 ENCOUNTER — Ambulatory Visit

## 2024-01-30 DIAGNOSIS — M6281 Muscle weakness (generalized): Secondary | ICD-10-CM | POA: Diagnosis not present

## 2024-01-30 DIAGNOSIS — R279 Unspecified lack of coordination: Secondary | ICD-10-CM

## 2024-01-30 DIAGNOSIS — C50411 Malignant neoplasm of upper-outer quadrant of right female breast: Secondary | ICD-10-CM

## 2024-01-30 DIAGNOSIS — R293 Abnormal posture: Secondary | ICD-10-CM

## 2024-01-30 DIAGNOSIS — R269 Unspecified abnormalities of gait and mobility: Secondary | ICD-10-CM

## 2024-01-30 DIAGNOSIS — M25611 Stiffness of right shoulder, not elsewhere classified: Secondary | ICD-10-CM

## 2024-01-30 DIAGNOSIS — M79604 Pain in right leg: Secondary | ICD-10-CM

## 2024-01-30 DIAGNOSIS — Z483 Aftercare following surgery for neoplasm: Secondary | ICD-10-CM

## 2024-01-30 NOTE — Therapy (Signed)
 OUTPATIENT PHYSICAL THERAPY BREAST CANCER TREATMENT   Patient Name: Amanda Herring MRN: [id number] DOB:04-23-1955, 68 y.o., female Today's Date: 01/30/2024  END OF SESSION:  PT End of Session - 01/30/24 0805     Visit Number 16    Number of Visits 19    Date for Recertification  03/08/24    Authorization Type [employer]    Authorization Time Period Med necessity; no visit limit    Progress Note Due on Visit 20    PT Start Time 0800    PT Stop Time 0857    PT Time Calculation (min) 57 min    Activity Tolerance Patient tolerated treatment well    Behavior During Therapy WFL for tasks assessed/performed          Past Medical History:  Diagnosis Date   Cancer (HCC)    Family history of adverse reaction to anesthesia    per patient her dad had post n/v   Family history of bladder cancer    GERD (gastroesophageal reflux disease)    Hepatitis C    s/p treatment 2012   History of blood transfusion 1981   with delivery w/baby   History of kidney stones    passed stones   Hypertension    Hypothyroidism    Pneumonia    x 1 yrs ago   PONV (postoperative nausea and vomiting)    Past Surgical History:  Procedure Laterality Date   ABDOMINAL HYSTERECTOMY     BREAST BIOPSY Right 11/04/2023   US  RT BREAST BX W LOC DEV 1ST LESION IMG BX SPEC US  GUIDE 11/04/2023 [facility]   BREAST EXCISIONAL BIOPSY Bilateral    1981/1980   BREAST LUMPECTOMY Left    1998   BREAST LUMPECTOMY Right 12/14/2023   Procedure: BREAST LUMPECTOMY;  Surgeon: Aron Shoulders, MD;  Location: [facility] OR;  Service: General;  Laterality: Right;   BREAST SURGERY     augmentation   RE-EXCISION OF BREAST LUMPECTOMY Right 12/29/2023   Procedure: EXCISION, LESION, BREAST;  Surgeon: Aron Shoulders, MD;  Location: [facility] OR;  Service: General;  Laterality: Right;  RE-EXCISION RIGHT BREAST LUMPECTOMY   SENTINEL NODE BIOPSY Right 12/14/2023   Procedure: BIOPSY, LYMPH NODE, SENTINEL;  Surgeon: Aron Shoulders,  MD;  Location: [facility] OR;  Service: General;  Laterality: Right;  SENTINEL LYMPH NODE BIOPSY   Patient Active Problem List   Diagnosis Date Noted   Genetic testing 11/28/2023   Family history of bladder cancer    Malignant neoplasm of upper-outer quadrant of right breast in female, estrogen receptor positive (HCC) 11/11/2023    REFERRING PROVIDER: Dr. Shoulders Aron    REFERRING DIAG: Right breast cancer; RIGHT PES BURSA/RIGHT HAMSTRING STRAIN   THERAPY DIAG:  Muscle weakness (generalized)  Pain in right leg  Unspecified lack of coordination  Abnormal posture  Abnormality of gait and mobility  Malignant neoplasm of upper-outer quadrant of right breast in female, estrogen receptor positive (HCC)  Stiffness of right shoulder, not elsewhere classified  Aftercare following surgery for neoplasm  Rationale for Evaluation and Treatment: Rehabilitation  ONSET DATE: 12/14/2023  SUBJECTIVE:  SUBJECTIVE STATEMENT: I'd like to resume working on my leg strength. I think I found another cord in my Rt axilla.   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 She had a right lumpectomy and sentinel node biopsy (2 negative nodes) on 12/14/2023 and then a re-excision on 12/29/2023 to obtain clear margins. It is ER/PR positive and HER2 negative with a Ki67 of 1%. She had a left lumpectomy in 1998 due to breast cancer at [facility].   PATIENT GOALS:  Reassess how my recovery is going related to arm function, pain, and swelling. Decrease right knee pain so I can walk more and be more active.  PAIN:  Are you having pain? Yes: NPRS scale: Rt knee 3/10 Pain location: Right medial knee Pain description: dull, achy with intermittent rubbing in the medial  joint Aggravating factors: Walking, weightbearing Relieving factors: the short helped, Doing the PT exercises  PRECAUTIONS: Recent Surgery, right UE Lymphedema risk  RED FLAGS: None   ACTIVITY LEVEL / LEISURE: Only doing exercises given by PT and doing a seated elliptical exercise at home.  OBJECTIVE:   PATIENT SURVEYS:  QUICK DASH:     PATIENT SURVEYS:  LEFS  Extreme difficulty/unable (0), Quite a bit of difficulty (1), Moderate difficulty (2), Little difficulty (3), No difficulty (4) Survey date:  11/17/23 01/12/2024  Any of your usual work, housework or school activities 1 3  2. Usual hobbies, recreational or sporting activities 0 3  3. Getting into/out of the bath 1 4  4. Walking between rooms 1 3  5. Putting on socks/shoes 2 4  6. Squatting  1 3  7. Lifting an object, like a bag of groceries from the floor 1 3  8. Performing light activities around your home 1 4  9. Performing heavy activities around your home 0 3  10. Getting into/out of a car 1 3  11. Walking 2 blocks 0 3  12. Walking 1 mile 0 2  13. Going up/down 10 stairs (1 flight) 1 3  14. Standing for 1 hour 1 3  15.  sitting for 1 hour 4 3  16. Running on even ground 0 2  17. Running on uneven ground 0 1  18. Making sharp turns while running fast 0 1  19. Hopping  0 1  20. Rolling over in bed 3 3  Score total:  18 55 (score significantly increased due to inactivity since breast surgery     FUNCTIONAL TESTS:  5 times sit to stand: 11s no AD lowest table setting at eval; today 01/12/2024, completed in 10 sec 2 minute walk test: 384' no AD at eval; 330' today 01/12/2024; limited by pain   GAIT: Distance walked: TWO MINUTE WALK TEST *individual walks without assistance for 2 minutes and the distance is measured *start timing when the individual is instructed to "Go" *stop timing at 2 minutes *assistive devices can be used but should be kept consistent and  documented from test to test  *if physical  assistance is required to walk, this should not be performed * a measuring wheel is helpful to determine distance walked *should be performed at the fastest speed possible   GenderAge in years (# in study)Mean distance in meters Female              18-54 (260)  200.9 55-59 (23)                           191.0 60-64 (29)                           179.1 (587 feet) 65-69 (22)                           184.2 70-74 (32)                           172.2 75-79 (19)                           157.6 80-85 (32)                           144.1   Female          18-54 (539)                         183.0 55-59 (30)                           176.4 60-64 (48)                           166.4 65-69 (22)                           155.2 70-74 (33)                           145.9 75-79 (14)                           140.9 80-85 (34)                           134.3   Assistive device utilized: None Level of assistance: Complete Independence  Comments: EVAL: trunk flexion, trunk lateral sway for limb advancement, increased step length at Rt with increased pain with attempts to shorten step, decreased cadence 01/12/2024: Slightly flexed posture; very antalgic gait  OBSERVATIONS: Right axillary and breast incisions both have healed well with some moderate scar tissue tightness present. Slightly tender to palpation at incisions and mild lateral breast edema present. One axillary cord present deep in axilla.  POSTURE:  Forward head, rounded shoulders  LYMPHEDEMA ASSESSMENT:   UPPER EXTREMITY AROM/PROM:   A/PROM RIGHT   Eval Addendum - added after eval 11/24/23   RIGHT 01/12/2024  Shoulder extension 70 59  Shoulder flexion 153 144  Shoulder abduction 175 150  Shoulder internal rotation 80 72  Shoulder external rotation 75 82                          (Blank rows = not tested)   A/PROM LEFT   Eval Addendum - added after eval 11/24/23  Shoulder extension 58  Shoulder flexion  163  Shoulder abduction 170  Shoulder internal rotation 75  Shoulder external rotation 95                          (  Blank rows = not tested)   UPPER EXTREMITY STRENGTH: WFL   LYMPHEDEMA ASSESSMENTS (in cm):    LANDMARK RIGHT   Eval Addendum - added after eval 11/24/23 RIGHT 01/12/2024  10 cm proximal to olecranon process 33.7 32.6  Olecranon process 27.6 27.2  10 cm proximal to ulnar styloid process 21.8 21.9  Just proximal to ulnar styloid process 16.1 14.6  Across hand at thumb web space 19.5 18.2  At base of 2nd digit 6.2 5.8  (Blank rows = not tested)   LANDMARK LEFT   Eval Addendum - added after eval 11/24/23 LEFT 01/12/2024  10 cm proximal to olecranon process 35.2 35.1  Olecranon process 30 27.3  10 cm proximal to ulnar styloid process 22.5 22.8  Just proximal to ulnar styloid process 16.2 14.7  Across hand at thumb web space 20.2 17.9  At base of 2nd digit 6.5 6  (Blank rows = not tested)  Surgery type/Date: 12/14/2023 right lumpectomy and sentinel node biopsy Number of lymph nodes removed: 2 Current/past treatment (chemo, radiation, hormone therapy): none Other symptoms:  Heaviness/tightness No Pain No Pitting edema No Infections No Decreased scar mobility Yes Stemmer sign No   TODAY'S TREATMENT: 01/30/24: Therapeutic Exercises and Activities  NuStep Level 5 x 5 mins, Level 4 x 5 mins for 10 mins total: LE 7/ UE 9 x for 3:30 mins only Squats with purple ball squeeze in front of chair x 10, mild pain, but did have increased popping so stopped after 10 reps In // bars for following: Green theraband for bil hip 3 way raises x 10 each, tried SLS on blue oval for increased stability with ex, but this caused increased medial knee pain in each direction so stopped and just did on level surface Seated bil HS stretch and then figure 4 piriformis stretch x 2 reps, 20-30 sec holds each Bil leg press 80# x 20 with purple ball squeeze throughout for focus on VMO  strengthening to help improve stability, no pain with this Roll yellow ball up wall into Rt UE abd x 10 with VC's to remind pt of correct technique Manual Therapy P/ROM to Rt shoulder into flex, abd and D2 with scapular depression throughout by therapist MFR to cording in Rt axilla, cording was very mildly palpable today, mostly scar tissue. Demonstrated how pt can perform self MFR with end ROM stretch in doorway, she verbalized understanding.   01/27/24: Therapeutic Exercise Pulleys into flex and abd x 2 mins each with tactile and VC's during to decrease Rt scapular compensation Roll yellow ball up wall into flex and then Rt abd x 10 each, tactile cuing for Rt scapular depression at end stretch Therapeutic Activities Supine over half roll for following: Bil UE horz abd x 10, bil UE scaption into a V x 10, and then bil UE abd in a snow angel x 10, 5 sec holds returning therapist demo for each, pt did not have pain with these motions today as she did at last session Supine Scapular Series with red theraband as follows: Bil horz abd, bil UE er, narrow and wide grip flexion, and then D2 x 10 each and handout issued Manual Therapy P/ROM to Rt shoulder into flex, abd and D2 with scapular depression throughout by therapist MFR to cording in Rt axilla, cording conts to be less palpable and visible today   01/25/24: Therapeutic Exercise Pulleys into flex and abd x 2 mins each with tactile and VC's during to decrease Rt scapular compensation Roll  yellow ball up wall into flex and then Rt abd x 10 each, tactile cuing for Rt scapular depression at end stretch Modified Downward dog on wall x 5 reps, 5 sec holds Therapeutic Activities Free Motion Machine for following: 7# scap retract 2 x 10 with short rest break between sets for muscle recovery, then bil Ue ext with machine arms elevated to 5, 3# x 10 returning therapist for each and VC's during for core engagement and to depress scapulae throughout as  pt tends to elevate bil UT's Supine over half roll for following: Bil UE horz abd x 10, bil UE scaption into a V x 10, and then bil UE abd in a snow angel x 10, 5 sec holds returning therapist demo for each, pt did not have pain with these motions today as she did at last session Manual Therapy P/ROM to Rt shoulder into flex, abd and D2 with scapular depression throughout by therapist MFR to cording in Rt axilla, cording was less palpable and visible today         PATIENT EDUCATION:  Education details: Reviewed HEP for post op breast cancer; instructed with scar massage for breast and axilla; educated pt briefly on lymphedema risk reduction and encouraged her to watch video (see link in pt instructions) Person educated: Patient Education method: Explanation, demonstration, handout Education comprehension: verbalized understanding and returned demonstration  HOME EXERCISE PROGRAM: Reviewed previously given post op HEP.   ASSESSMENT:  CLINICAL IMPRESSION: Resumed Rt LE strength and stability exercise. Pt is limited by pain at anteriomedial Rt knee with stability exercises with theraband, but had less discomfort when performing exercises on level surface. Then continued with Rt shoulder stretching working to decrease fascial restrictions from second surgery and to help her to be as loose as able with upcoming radiation starting Thursday.   Pt will benefit from skilled therapeutic intervention to improve on the following deficits: Decreased knowledge of precautions, impaired UE functional use, pain, decreased ROM, postural dysfunction.   PT treatment/interventions: ADL/Self care home management, [phone]- PT Re-evaluation, 97110-Therapeutic exercises, 97530- Therapeutic activity, [id number]- Neuromuscular re-education, [phone]- Self Care, 02859- Manual therapy, [phone]- Gait training, [phone]- Vasopneumatic device, Patient/Family education, Balance training, Stair training, Taping, Scar mobilization,  Moist heat, and heat/ice only for right LE   GOALS: Goals reviewed with patient? Yes  GOALS MET AT BREAST CANCER EVAL:  GOALS Name Target Date Goal status  1 Pt will be able to verbalize understanding of pertinent lymphedema risk reduction practices relevant to her dx specifically related to skin care.  Baseline:  No knowledge Eval Achieved at eval  2 Pt will be able to return demo and/or verbalize understanding of the post op HEP related to regaining shoulder ROM. Baseline:  No knowledge Eval Achieved at eval  3 Pt will be able to verbalize understanding of the importance of viewing the post op After Breast CA Class video for further lymphedema risk reduction education and therapeutic exercise.  Baseline:  No knowledge Eval Achieved at eval   LONG TERM GOALS FOR BREAST CANCER:   GOALS Name Target Date  Goal status  1 Pt will demonstrate she has regained full shoulder ROM and function post operatively compared to baselines.  Baseline: 02/09/2024 Ongoing  2 Patient will improve DASH score to be zero for improved overall UE function. 02/09/2024 INITIAL  3 Patient will verbalize good understanding of lymphedema risk reduction practices 02/09/2024 INITIAL   SHORT TERM GOALS for hamstring strain: Target date: 12/15/23 (revised 01/12/2024 for target date  02/09/2024) Pt to be I with HEP for carry over and continuing recommendations for improved outcomes.   Baseline: Goal status: met 9/30   2.  Pt to demonstrate full ROM of Rt hip without pain for improved gait mechanics to tolerate return to PLOF for walking.  Baseline:  Goal status: partially met 01/12/2024   3.  Pt to demonstrate x10 sit to stands without pain or compensation for improved hip strength for walking. Baseline:  Goal status: met 9/30   4.  Pt to demonstrate 5/5 bil hip strength without pain for improved ability to transfer from ground to standing to return to gardening Baseline:  Goal status: partially met, knee pain  affects transfers; 01/12/2024 not tested due to increased pain     LONG TERM GOALS for hamstring strain: Target date: 01/12/24 (revised 01/12/2024 with new target date of 03/08/2024)   Pt to be I with advanced HEP for carry over and continuing recommendations for improved outcomes.   Baseline:  Goal status: Ongoing 01/12/2024   2.  Pt to report ability to tolerate walking at least 1 mile with no more than 1/10 pain for improved QOL.  Baseline:  Goal status: Ongoing 01/12/2024   3.  Pt to demonstrate improved within age related norms due to improved cadence and mechanics of gait for improved ability to complete grocery errands.  Baseline:  Goal status: Ongoing 01/12/2024   4.  Pt to demonstrate improved 5xSTS to no more than 7s for decreased fall risk.  Baseline:  Goal status: Ongoing (10 sec 01/12/2024)      PLAN:  PT FREQUENCY/DURATION: 2x/week for 8 weeks  PLAN FOR NEXT SESSION: Review supine scapular series, Manual techniques to increase right shoulder flexion and abduction and to address axillary cording. NuStep, right leg strengthening; consider right knee kinesiotaping for pain?   [facility]  [address], [address]  [place] [address]  [phone]   Over Head Pull: Narrow and Wide Grip   [facility] [phone]   On back, knees bent, feet flat, band across thighs, elbows straight but relaxed. Pull hands apart (start). Keeping elbows straight, bring arms up and over head, hands toward floor. Keep pull steady on band. Hold momentarily. Return slowly, keeping pull steady, back to start. Then do same with a wider grip on the band (past shoulder width) Repeat _5-10__ times. Band color __red____   Side Pull: Double Arm   On back, knees bent, feet flat. Arms perpendicular to body, shoulder level, elbows straight but relaxed. Pull arms out to sides, elbows straight. Resistance band comes across collarbones, hands toward floor. Hold momentarily.  Slowly return to starting position. Repeat _5-10__ times. Band color _red____   Sword   On back, knees bent, feet flat, left hand on left hip, right hand above left. Pull right arm DIAGONALLY (hip to shoulder) across chest. Bring right arm along head toward floor. Hold momentarily. Slowly return to starting position. Repeat _5-10__ times. Do with left arm. Band color _red_____   Shoulder Rotation: Double Arm   On back, knees bent, feet flat, elbows tucked at sides, bent 90, hands palms up. Pull hands apart and down toward floor, keeping elbows near sides. Hold momentarily. Slowly return to starting position. Repeat _5-10__ times. Band color __red____

## 2024-01-31 ENCOUNTER — Encounter: Payer: Self-pay | Admitting: *Deleted

## 2024-01-31 DIAGNOSIS — Z170 Estrogen receptor positive status [ER+]: Secondary | ICD-10-CM

## 2024-01-31 DIAGNOSIS — Z510 Encounter for antineoplastic radiation therapy: Secondary | ICD-10-CM | POA: Diagnosis not present

## 2024-02-01 ENCOUNTER — Ambulatory Visit

## 2024-02-01 DIAGNOSIS — Z483 Aftercare following surgery for neoplasm: Secondary | ICD-10-CM

## 2024-02-01 DIAGNOSIS — M6281 Muscle weakness (generalized): Secondary | ICD-10-CM | POA: Diagnosis not present

## 2024-02-01 DIAGNOSIS — C50411 Malignant neoplasm of upper-outer quadrant of right female breast: Secondary | ICD-10-CM

## 2024-02-01 DIAGNOSIS — R279 Unspecified lack of coordination: Secondary | ICD-10-CM

## 2024-02-01 DIAGNOSIS — M25611 Stiffness of right shoulder, not elsewhere classified: Secondary | ICD-10-CM

## 2024-02-01 DIAGNOSIS — M79604 Pain in right leg: Secondary | ICD-10-CM

## 2024-02-01 DIAGNOSIS — R269 Unspecified abnormalities of gait and mobility: Secondary | ICD-10-CM

## 2024-02-01 DIAGNOSIS — R293 Abnormal posture: Secondary | ICD-10-CM

## 2024-02-01 NOTE — Patient Instructions (Addendum)
 Amanda Herring

## 2024-02-01 NOTE — Therapy (Signed)
 OUTPATIENT PHYSICAL THERAPY BREAST CANCER TREATMENT   Patient Name: Amanda Herring MRN: [id number] DOB:1955/07/26, 68 y.o., female Today's Date: 02/01/2024  END OF SESSION:  PT End of Session - 02/01/24 0804     Visit Number 17    Number of Visits 19    Date for Recertification  03/08/24    Authorization Type [employer]    Authorization Time Period Med necessity; no visit limit    Progress Note Due on Visit 20    PT Start Time 0802    PT Stop Time 0858    PT Time Calculation (min) 56 min    Activity Tolerance Patient tolerated treatment well    Behavior During Therapy WFL for tasks assessed/performed          Past Medical History:  Diagnosis Date   Cancer (HCC)    Family history of adverse reaction to anesthesia    per patient her dad had post n/v   Family history of bladder cancer    GERD (gastroesophageal reflux disease)    Hepatitis C    s/p treatment 2012   History of blood transfusion 1981   with delivery w/baby   History of kidney stones    passed stones   Hypertension    Hypothyroidism    Pneumonia    x 1 yrs ago   PONV (postoperative nausea and vomiting)    Past Surgical History:  Procedure Laterality Date   ABDOMINAL HYSTERECTOMY     BREAST BIOPSY Right 11/04/2023   US  RT BREAST BX W LOC DEV 1ST LESION IMG BX SPEC US  GUIDE 11/04/2023 [facility]   BREAST EXCISIONAL BIOPSY Bilateral    1981/1980   BREAST LUMPECTOMY Left    1998   BREAST LUMPECTOMY Right 12/14/2023   Procedure: BREAST LUMPECTOMY;  Surgeon: Aron Shoulders, MD;  Location: [facility] OR;  Service: General;  Laterality: Right;   BREAST SURGERY     augmentation   RE-EXCISION OF BREAST LUMPECTOMY Right 12/29/2023   Procedure: EXCISION, LESION, BREAST;  Surgeon: Aron Shoulders, MD;  Location: [facility] OR;  Service: General;  Laterality: Right;  RE-EXCISION RIGHT BREAST LUMPECTOMY   SENTINEL NODE BIOPSY Right 12/14/2023   Procedure: BIOPSY, LYMPH NODE, SENTINEL;  Surgeon: Aron Shoulders,  MD;  Location: [facility] OR;  Service: General;  Laterality: Right;  SENTINEL LYMPH NODE BIOPSY   Patient Active Problem List   Diagnosis Date Noted   Genetic testing 11/28/2023   Family history of bladder cancer    Malignant neoplasm of upper-outer quadrant of right breast in female, estrogen receptor positive (HCC) 11/11/2023    REFERRING PROVIDER: Dr. Shoulders Aron    REFERRING DIAG: Right breast cancer; RIGHT PES BURSA/RIGHT HAMSTRING STRAIN   THERAPY DIAG:  Muscle weakness (generalized)  Pain in right leg  Unspecified lack of coordination  Abnormal posture  Abnormality of gait and mobility  Malignant neoplasm of upper-outer quadrant of right breast in female, estrogen receptor positive (HCC)  Stiffness of right shoulder, not elsewhere classified  Aftercare following surgery for neoplasm  Rationale for Evaluation and Treatment: Rehabilitation  ONSET DATE: 12/14/2023  SUBJECTIVE:  SUBJECTIVE STATEMENT: I was definitely more tired after our last session with starting back with the leg exercises. My Rt knee was hurting more after last time too, mostly that it was catching more. My Rt arm is feeling good.   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 She had a right lumpectomy and sentinel node biopsy (2 negative nodes) on 12/14/2023 and then a re-excision on 12/29/2023 to obtain clear margins. It is ER/PR positive and HER2 negative with a Ki67 of 1%. She had a left lumpectomy in 1998 due to breast cancer at [facility].   PATIENT GOALS:  Reassess how my recovery is going related to arm function, pain, and swelling. Decrease right knee pain so I can walk more and be more active.  PAIN:  Are you having pain? Yes: NPRS scale: Rt knee  1/10, but up to 3/10 when the catch hits Pain location: Right medial knee Pain description: dull, achy with intermittent  sharp pain or catch in the medial joint Aggravating factors: Walking, weightbearing Relieving factors: the short helped, Doing the PT exercises  PRECAUTIONS: Recent Surgery, right UE Lymphedema risk  RED FLAGS: None   ACTIVITY LEVEL / LEISURE: Only doing exercises given by PT and doing a seated elliptical exercise at home.  OBJECTIVE:   PATIENT SURVEYS:  QUICK DASH:     PATIENT SURVEYS:  LEFS  Extreme difficulty/unable (0), Quite a bit of difficulty (1), Moderate difficulty (2), Little difficulty (3), No difficulty (4) Survey date:  11/17/23 01/12/2024  Any of your usual work, housework or school activities 1 3  2. Usual hobbies, recreational or sporting activities 0 3  3. Getting into/out of the bath 1 4  4. Walking between rooms 1 3  5. Putting on socks/shoes 2 4  6. Squatting  1 3  7. Lifting an object, like a bag of groceries from the floor 1 3  8. Performing light activities around your home 1 4  9. Performing heavy activities around your home 0 3  10. Getting into/out of a car 1 3  11. Walking 2 blocks 0 3  12. Walking 1 mile 0 2  13. Going up/down 10 stairs (1 flight) 1 3  14. Standing for 1 hour 1 3  15.  sitting for 1 hour 4 3  16. Running on even ground 0 2  17. Running on uneven ground 0 1  18. Making sharp turns while running fast 0 1  19. Hopping  0 1  20. Rolling over in bed 3 3  Score total:  18 55 (score significantly increased due to inactivity since breast surgery     FUNCTIONAL TESTS:  5 times sit to stand: 11s no AD lowest table setting at eval; today 01/12/2024, completed in 10 sec 2 minute walk test: 384' no AD at eval; 330' today 01/12/2024; limited by pain   GAIT: Distance walked: TWO MINUTE WALK TEST *individual walks without assistance for 2 minutes and the distance is measured *start timing when the individual is  instructed to "Go" *stop timing at 2 minutes *assistive devices can be used but should be kept consistent and  documented from test to test  *if physical assistance is required to walk, this should not be performed * a measuring wheel is helpful to determine distance walked *should be performed at the fastest speed possible   GenderAge in years (# in study)Mean distance in meters Female  18-54 (260)                         200.9 55-59 (23)                           191.0 60-64 (29)                           179.1 (587 feet) 65-69 (22)                           184.2 70-74 (32)                           172.2 75-79 (19)                           157.6 80-85 (32)                           144.1   Female          18-54 (539)                         183.0 55-59 (30)                           176.4 60-64 (48)                           166.4 65-69 (22)                           155.2 70-74 (33)                           145.9 75-79 (14)                           140.9 80-85 (34)                           134.3   Assistive device utilized: None Level of assistance: Complete Independence  Comments: EVAL: trunk flexion, trunk lateral sway for limb advancement, increased step length at Rt with increased pain with attempts to shorten step, decreased cadence 01/12/2024: Slightly flexed posture; very antalgic gait  OBSERVATIONS: Right axillary and breast incisions both have healed well with some moderate scar tissue tightness present. Slightly tender to palpation at incisions and mild lateral breast edema present. One axillary cord present deep in axilla.  POSTURE:  Forward head, rounded shoulders  LYMPHEDEMA ASSESSMENT:   UPPER EXTREMITY AROM/PROM:   A/PROM RIGHT   Eval Addendum - added after eval 11/24/23   RIGHT 01/12/2024  Shoulder extension 70 59  Shoulder flexion 153 144  Shoulder abduction 175 150  Shoulder internal rotation 80 72  Shoulder external rotation 75 82                           (Blank rows = not tested)   A/PROM LEFT   Eval Addendum - added after eval 11/24/23  Shoulder extension 58  Shoulder flexion 163  Shoulder abduction 170  Shoulder internal rotation 75  Shoulder external rotation 95                          (Blank rows = not tested)   UPPER EXTREMITY STRENGTH: WFL   LYMPHEDEMA ASSESSMENTS (in cm):    LANDMARK RIGHT   Eval Addendum - added after eval 11/24/23 RIGHT 01/12/2024  10 cm proximal to olecranon process 33.7 32.6  Olecranon process 27.6 27.2  10 cm proximal to ulnar styloid process 21.8 21.9  Just proximal to ulnar styloid process 16.1 14.6  Across hand at thumb web space 19.5 18.2  At base of 2nd digit 6.2 5.8  (Blank rows = not tested)   LANDMARK LEFT   Eval Addendum - added after eval 11/24/23 LEFT 01/12/2024  10 cm proximal to olecranon process 35.2 35.1  Olecranon process 30 27.3  10 cm proximal to ulnar styloid process 22.5 22.8  Just proximal to ulnar styloid process 16.2 14.7  Across hand at thumb web space 20.2 17.9  At base of 2nd digit 6.5 6  (Blank rows = not tested)  Surgery type/Date: 12/14/2023 right lumpectomy and sentinel node biopsy Number of lymph nodes removed: 2 Current/past treatment (chemo, radiation, hormone therapy): none Other symptoms:  Heaviness/tightness No Pain No Pitting edema No Infections No Decreased scar mobility Yes Stemmer sign No   TODAY'S TREATMENT: 02/01/24: Therapeutic Exercises NuStep Level 5 x 10 mins total: LE 6/ UE 10 x for 4:30 mins only Squats in front of mat table x 10 with demo for correct technique Seated Rt LAQ 4# x 10, 5 sec holds, then same with er 2 x 5, 5 sec holds Seated figure 4 piriformis stretch x 3 reps, 30 sec holds, pt reports Rt knee feels looser with this stretch today Supine Rt SLR with quad set x 10, 3 sec holds and slow eccentrically, then same with er x 10; these were very challenging for pt due to weakness not pain Bridging x  10, with VC's to slow eccentric motion Seated EOB for bil HS stretch x 3 reps, 30 sec holds Manual Therapy P/ROM to Rt shoulder into flex, abd and D2 with scapular depression throughout by therapist MFR to cording in Rt axilla, cording much less palpable overall and end motions improved   01/30/24: Therapeutic Exercises and Activities  NuStep Level 5 x 5 mins, Level 4 x 5 mins for 10 mins total: LE 7/ UE 9 x for 3:30 mins only Squats with purple ball squeeze in front of chair x 10, mild pain, but did have increased popping so stopped after 10 reps In // bars for following: Green theraband for bil hip 3 way raises x 10 each, tried SLS on blue oval for increased stability with ex, but this caused increased medial knee pain in each direction so stopped and just did on level surface Seated bil HS stretch and then figure 4 piriformis stretch x 2 reps, 20-30 sec holds each Bil leg press 80# x 20 with purple ball squeeze throughout for focus on VMO strengthening to help improve stability, no pain with this Roll yellow ball up wall into Rt UE abd x 10 with VC's to remind pt of correct technique Manual Therapy P/ROM to Rt shoulder into flex, abd and D2 with scapular depression throughout by therapist MFR to cording in Rt axilla, cording was very mildly palpable today, mostly scar tissue.  Demonstrated how pt can perform self MFR with end ROM stretch in doorway, she verbalized understanding.   01/27/24: Therapeutic Exercise Pulleys into flex and abd x 2 mins each with tactile and VC's during to decrease Rt scapular compensation Roll yellow ball up wall into flex and then Rt abd x 10 each, tactile cuing for Rt scapular depression at end stretch Therapeutic Activities Supine over half roll for following: Bil UE horz abd x 10, bil UE scaption into a V x 10, and then bil UE abd in a snow angel x 10, 5 sec holds returning therapist demo for each, pt did not have pain with these motions today as she did at  last session Supine Scapular Series with red theraband as follows: Bil horz abd, bil UE er, narrow and wide grip flexion, and then D2 x 10 each and handout issued Manual Therapy P/ROM to Rt shoulder into flex, abd and D2 with scapular depression throughout by therapist MFR to cording in Rt axilla, cording conts to be less palpable and visible today         PATIENT EDUCATION:  Education details: Rt LE strength in sitting and supine Person educated: Patient Education method: Explanation, demonstration, handout Education comprehension: verbalized understanding and returned demonstration  HOME EXERCISE PROGRAM: Reviewed previously given post op HEP. Supine scapular series Rt LE strength in NWB for pain free motion   ASSESSMENT:  CLINICAL IMPRESSION: Pt reports increased Rt medial knee discomfort after last session so scaled back a bit on exercises today and focused more on stability of the knee joint itself in NWB motions to limit pain. Pt reports was challenged by these activities but did not have increased pain so added these to her HEP. Also encouraged her to cont getting back into a daily walking routine as she has been doing but to not overdo it. Also instructed her to be aware of circumducting, which she does with her Rt LE. She has been walking for 20-30 mins so encouraged her to just keep doing this for awhile while incorporating new HEP exs so as not to exacerbate her knee pain. She verbalized understanding. Then continued with manual therapy working to decrease Rt upper quadrant tightness which is steadily improving since her second surgery. She reports compliance with supine scapular series for postural strength.   Pt will benefit from skilled therapeutic intervention to improve on the following deficits: Decreased knowledge of precautions, impaired UE functional use, pain, decreased ROM, postural dysfunction.   PT treatment/interventions: ADL/Self care home management, [phone]-  PT Re-evaluation, 97110-Therapeutic exercises, 97530- Therapeutic activity, [id number]- Neuromuscular re-education, [phone]- Self Care, [id number]- Manual therapy, [phone]- Gait training, [phone]- Vasopneumatic device, Patient/Family education, Balance training, Stair training, Taping, Scar mobilization, Moist heat, and heat/ice only for right LE   GOALS: Goals reviewed with patient? Yes  GOALS MET AT BREAST CANCER EVAL:  GOALS Name Target Date Goal status  1 Pt will be able to verbalize understanding of pertinent lymphedema risk reduction practices relevant to her dx specifically related to skin care.  Baseline:  No knowledge Eval Achieved at eval  2 Pt will be able to return demo and/or verbalize understanding of the post op HEP related to regaining shoulder ROM. Baseline:  No knowledge Eval Achieved at eval  3 Pt will be able to verbalize understanding of the importance of viewing the post op After Breast CA Class video for further lymphedema risk reduction education and therapeutic exercise.  Baseline:  No knowledge Eval Achieved at eval  LONG TERM GOALS FOR BREAST CANCER:   GOALS Name Target Date  Goal status  1 Pt will demonstrate she has regained full shoulder ROM and function post operatively compared to baselines.  Baseline: 02/09/2024 Ongoing  2 Patient will improve DASH score to be zero for improved overall UE function. 02/09/2024 INITIAL  3 Patient will verbalize good understanding of lymphedema risk reduction practices 02/09/2024 INITIAL   SHORT TERM GOALS for hamstring strain: Target date: 12/15/23 (revised 01/12/2024 for target date 02/09/2024) Pt to be I with HEP for carry over and continuing recommendations for improved outcomes.   Baseline: Goal status: met 9/30   2.  Pt to demonstrate full ROM of Rt hip without pain for improved gait mechanics to tolerate return to PLOF for walking.  Baseline:  Goal status: partially met 01/12/2024   3.  Pt to demonstrate x10 sit to stands without  pain or compensation for improved hip strength for walking. Baseline:  Goal status: met 9/30   4.  Pt to demonstrate 5/5 bil hip strength without pain for improved ability to transfer from ground to standing to return to gardening Baseline:  Goal status: partially met, knee pain affects transfers; 01/12/2024 not tested due to increased pain     LONG TERM GOALS for hamstring strain: Target date: 01/12/24 (revised 01/12/2024 with new target date of 03/08/2024)   Pt to be I with advanced HEP for carry over and continuing recommendations for improved outcomes.   Baseline:  Goal status: Ongoing 01/12/2024   2.  Pt to report ability to tolerate walking at least 1 mile with no more than 1/10 pain for improved QOL.  Baseline:  Goal status: Ongoing 01/12/2024   3.  Pt to demonstrate improved within age related norms due to improved cadence and mechanics of gait for improved ability to complete grocery errands.  Baseline:  Goal status: Ongoing 01/12/2024   4.  Pt to demonstrate improved 5xSTS to no more than 7s for decreased fall risk.  Baseline:  Goal status: Ongoing (10 sec 01/12/2024)      PLAN:  PT FREQUENCY/DURATION: 2x/week for 8 weeks  PLAN FOR NEXT SESSION: How are new R LE exercises going? Cont NuStep (only intermittent with UE's as she has had LN's removed and 2 breast surgeries now), right leg strengthening; consider right knee kinesiotaping for pain? Manual techniques to increase right shoulder flexion and abduction and to address axillary cording prn.   [facility]  [address], [address]  [place] [address]  [phone]   Long Arc Quad    Straighten operated leg and try to hold it __3-5__ seconds. Use __4__ lbs on ankle.  If no light weight available, just hold longer. Then also do same with foot rolled out.  Repeat __1__ times. Do __2__ sessions a day.  Straight Leg Raise    Tighten stomach and thigh muscle and slowly raise right  leg about half way up.  Then do same with foot rolled out. Repeat _10___ times per set. Do _1-2___ sets per session. Do __2__ sessions per day.   Bridging (Buttocks / Hamstring Stretch)    Lie on back, knees bent, feet flat on surface. Breathe in. Raise buttocks and hold position _3-5__ seconds, breathing out through pursed lips. Return to start position, breathing in. Remember: do not hold your breath. Repeat _10__ times. Do _1-2__ sessions per day. Variation: Lie on bed, not on floor.  Hamstring Stretch    Inhale and straighten spine. Exhale  and lean forward toward extended leg. Hold position for _20-30__ seconds. Inhale and come back to center. Repeat with other leg extended. Repeat _3__ times, alternating legs. Do _3__ times per day.   [facility] [phone]

## 2024-02-02 ENCOUNTER — Ambulatory Visit
Admission: RE | Admit: 2024-02-02 | Discharge: 2024-02-02 | Disposition: A | Discharge status: Home / Self Care | Source: Ambulatory Visit | Attending: Radiation Oncology | Admitting: Radiation Oncology

## 2024-02-02 ENCOUNTER — Other Ambulatory Visit: Payer: Self-pay

## 2024-02-02 ENCOUNTER — Ambulatory Visit

## 2024-02-02 DIAGNOSIS — Z510 Encounter for antineoplastic radiation therapy: Secondary | ICD-10-CM | POA: Diagnosis not present

## 2024-02-02 LAB — RAD ONC ARIA SESSION SUMMARY
Course Elapsed Days: 0
Plan Fractions Treated to Date: 1
Plan Prescribed Dose Per Fraction: 1.8 Gy
Plan Total Fractions Prescribed: 28
Plan Total Prescribed Dose: 50.4 Gy
Reference Point Dosage Given to Date: 1.8 Gy
Reference Point Session Dosage Given: 1.8 Gy
Session Number: 1

## 2024-02-03 ENCOUNTER — Ambulatory Visit
Admission: RE | Admit: 2024-02-03 | Discharge: 2024-02-03 | Disposition: A | Discharge status: Home / Self Care | Source: Ambulatory Visit | Attending: Radiation Oncology | Admitting: Radiation Oncology

## 2024-02-03 ENCOUNTER — Other Ambulatory Visit: Payer: Self-pay

## 2024-02-03 DIAGNOSIS — Z170 Estrogen receptor positive status [ER+]: Secondary | ICD-10-CM

## 2024-02-03 DIAGNOSIS — Z510 Encounter for antineoplastic radiation therapy: Secondary | ICD-10-CM | POA: Diagnosis not present

## 2024-02-03 LAB — RAD ONC ARIA SESSION SUMMARY
Course Elapsed Days: 1
Plan Fractions Treated to Date: 2
Plan Prescribed Dose Per Fraction: 1.8 Gy
Plan Total Fractions Prescribed: 28
Plan Total Prescribed Dose: 50.4 Gy
Reference Point Dosage Given to Date: 3.6 Gy
Reference Point Session Dosage Given: 1.8 Gy
Session Number: 2

## 2024-02-03 MED ORDER — RADIAPLEXRX EX GEL: Freq: Once | CUTANEOUS | Status: AC

## 2024-02-03 MED ORDER — ALRA NON-METALLIC DEODORANT (RAD-ONC)
1.0000 | Freq: Once | TOPICAL | Status: AC
  Administered 2024-02-03: 1 via TOPICAL

## 2024-02-05 ENCOUNTER — Other Ambulatory Visit: Payer: Self-pay

## 2024-02-05 ENCOUNTER — Ambulatory Visit
Admission: RE | Admit: 2024-02-05 | Discharge: 2024-02-05 | Disposition: A | Discharge status: Home / Self Care | Source: Ambulatory Visit | Attending: Radiation Oncology | Admitting: Radiation Oncology

## 2024-02-05 DIAGNOSIS — Z510 Encounter for antineoplastic radiation therapy: Secondary | ICD-10-CM | POA: Diagnosis not present

## 2024-02-05 LAB — RAD ONC ARIA SESSION SUMMARY
Course Elapsed Days: 3
Plan Fractions Treated to Date: 3
Plan Prescribed Dose Per Fraction: 1.8 Gy
Plan Total Fractions Prescribed: 28
Plan Total Prescribed Dose: 50.4 Gy
Reference Point Dosage Given to Date: 5.4 Gy
Reference Point Session Dosage Given: 1.8 Gy
Session Number: 3

## 2024-02-06 ENCOUNTER — Ambulatory Visit

## 2024-02-06 ENCOUNTER — Other Ambulatory Visit: Payer: Self-pay

## 2024-02-06 ENCOUNTER — Ambulatory Visit
Admission: RE | Admit: 2024-02-06 | Discharge: 2024-02-06 | Disposition: A | Discharge status: Home / Self Care | Source: Ambulatory Visit | Attending: Radiation Oncology | Admitting: Radiation Oncology

## 2024-02-06 DIAGNOSIS — Z510 Encounter for antineoplastic radiation therapy: Secondary | ICD-10-CM | POA: Diagnosis not present

## 2024-02-06 LAB — RAD ONC ARIA SESSION SUMMARY
Course Elapsed Days: 4
Plan Fractions Treated to Date: 4
Plan Prescribed Dose Per Fraction: 1.8 Gy
Plan Total Fractions Prescribed: 28
Plan Total Prescribed Dose: 50.4 Gy
Reference Point Dosage Given to Date: 7.2 Gy
Reference Point Session Dosage Given: 1.8 Gy
Session Number: 4

## 2024-02-07 ENCOUNTER — Ambulatory Visit
Admission: RE | Admit: 2024-02-07 | Discharge: 2024-02-07 | Disposition: A | Discharge status: Home / Self Care | Source: Ambulatory Visit | Attending: Radiation Oncology | Admitting: Radiation Oncology

## 2024-02-07 ENCOUNTER — Other Ambulatory Visit: Payer: Self-pay

## 2024-02-07 DIAGNOSIS — Z510 Encounter for antineoplastic radiation therapy: Secondary | ICD-10-CM | POA: Diagnosis not present

## 2024-02-07 LAB — RAD ONC ARIA SESSION SUMMARY
Course Elapsed Days: 5
Plan Fractions Treated to Date: 5
Plan Prescribed Dose Per Fraction: 1.8 Gy
Plan Total Fractions Prescribed: 28
Plan Total Prescribed Dose: 50.4 Gy
Reference Point Dosage Given to Date: 9 Gy
Reference Point Session Dosage Given: 1.8 Gy
Session Number: 5

## 2024-02-08 ENCOUNTER — Ambulatory Visit

## 2024-02-08 ENCOUNTER — Ambulatory Visit
Admission: RE | Admit: 2024-02-08 | Discharge: 2024-02-08 | Disposition: A | Discharge status: Home / Self Care | Source: Ambulatory Visit | Attending: Radiation Oncology | Admitting: Radiation Oncology

## 2024-02-08 ENCOUNTER — Other Ambulatory Visit: Payer: Self-pay

## 2024-02-08 DIAGNOSIS — Z510 Encounter for antineoplastic radiation therapy: Secondary | ICD-10-CM | POA: Diagnosis not present

## 2024-02-08 DIAGNOSIS — M25611 Stiffness of right shoulder, not elsewhere classified: Secondary | ICD-10-CM

## 2024-02-08 DIAGNOSIS — M6281 Muscle weakness (generalized): Secondary | ICD-10-CM | POA: Diagnosis not present

## 2024-02-08 DIAGNOSIS — M79604 Pain in right leg: Secondary | ICD-10-CM

## 2024-02-08 DIAGNOSIS — R269 Unspecified abnormalities of gait and mobility: Secondary | ICD-10-CM

## 2024-02-08 DIAGNOSIS — Z483 Aftercare following surgery for neoplasm: Secondary | ICD-10-CM

## 2024-02-08 DIAGNOSIS — R279 Unspecified lack of coordination: Secondary | ICD-10-CM

## 2024-02-08 DIAGNOSIS — C50411 Malignant neoplasm of upper-outer quadrant of right female breast: Secondary | ICD-10-CM

## 2024-02-08 DIAGNOSIS — R293 Abnormal posture: Secondary | ICD-10-CM

## 2024-02-08 LAB — RAD ONC ARIA SESSION SUMMARY
Course Elapsed Days: 6
Plan Fractions Treated to Date: 6
Plan Prescribed Dose Per Fraction: 1.8 Gy
Plan Total Fractions Prescribed: 28
Plan Total Prescribed Dose: 50.4 Gy
Reference Point Dosage Given to Date: 10.8 Gy
Reference Point Session Dosage Given: 1.8 Gy
Session Number: 6

## 2024-02-08 NOTE — Therapy (Signed)
 OUTPATIENT PHYSICAL THERAPY BREAST CANCER TREATMENT   Patient Name: Amanda Herring MRN: [id number] DOB:07-Jan-1956, 68 y.o., female Today's Date: 02/08/2024  END OF SESSION:  PT End of Session - 02/08/24 0910     Visit Number 18    Number of Visits 19    Date for Recertification  03/08/24    Authorization Type [employer]    Authorization Time Period Med necessity; no visit limit    Progress Note Due on Visit 20    PT Start Time 0907    PT Stop Time 1000    PT Time Calculation (min) 53 min    Activity Tolerance Patient tolerated treatment well    Behavior During Therapy WFL for tasks assessed/performed          Past Medical History:  Diagnosis Date   Cancer (HCC)    Family history of adverse reaction to anesthesia    per patient her dad had post n/v   Family history of bladder cancer    GERD (gastroesophageal reflux disease)    Hepatitis C    s/p treatment 2012   History of blood transfusion 1981   with delivery w/baby   History of kidney stones    passed stones   Hypertension    Hypothyroidism    Pneumonia    x 1 yrs ago   PONV (postoperative nausea and vomiting)    Past Surgical History:  Procedure Laterality Date   ABDOMINAL HYSTERECTOMY     BREAST BIOPSY Right 11/04/2023   US  RT BREAST BX W LOC DEV 1ST LESION IMG BX SPEC US  GUIDE 11/04/2023 [facility]   BREAST EXCISIONAL BIOPSY Bilateral    1981/1980   BREAST LUMPECTOMY Left    1998   BREAST LUMPECTOMY Right 12/14/2023   Procedure: BREAST LUMPECTOMY;  Surgeon: Aron Shoulders, MD;  Location: [facility] OR;  Service: General;  Laterality: Right;   BREAST SURGERY     augmentation   RE-EXCISION OF BREAST LUMPECTOMY Right 12/29/2023   Procedure: EXCISION, LESION, BREAST;  Surgeon: Aron Shoulders, MD;  Location: [facility] OR;  Service: General;  Laterality: Right;  RE-EXCISION RIGHT BREAST LUMPECTOMY   SENTINEL NODE BIOPSY Right 12/14/2023   Procedure: BIOPSY, LYMPH NODE, SENTINEL;  Surgeon: Aron Shoulders,  MD;  Location: [facility] OR;  Service: General;  Laterality: Right;  SENTINEL LYMPH NODE BIOPSY   Patient Active Problem List   Diagnosis Date Noted   Genetic testing 11/28/2023   Family history of bladder cancer    Malignant neoplasm of upper-outer quadrant of right breast in female, estrogen receptor positive (HCC) 11/11/2023    REFERRING PROVIDER: Dr. Shoulders Aron    REFERRING DIAG: Right breast cancer; RIGHT PES BURSA/RIGHT HAMSTRING STRAIN   THERAPY DIAG:  Muscle weakness (generalized)  Pain in right leg  Unspecified lack of coordination  Abnormal posture  Abnormality of gait and mobility  Malignant neoplasm of upper-outer quadrant of right breast in female, estrogen receptor positive (HCC)  Stiffness of right shoulder, not elsewhere classified  Aftercare following surgery for neoplasm  Rationale for Evaluation and Treatment: Rehabilitation  ONSET DATE: 12/14/2023  SUBJECTIVE:  SUBJECTIVE STATEMENT: My fatigue is getting worse with radiation. I am just really tired and a bit SOB.   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 She had a right lumpectomy and sentinel node biopsy (2 negative nodes) on 12/14/2023 and then a re-excision on 12/29/2023 to obtain clear margins. It is ER/PR positive and HER2 negative with a Ki67 of 1%. She had a left lumpectomy in 1998 due to breast cancer at [facility].   PATIENT GOALS:  Reassess how my recovery is going related to arm function, pain, and swelling. Decrease right knee pain so I can walk more and be more active.  PAIN:  Are you having pain? No, my knee just hurts when I walk  PRECAUTIONS: Recent Surgery, right UE Lymphedema risk  RED FLAGS: None   ACTIVITY LEVEL / LEISURE: Only  doing exercises given by PT and doing a seated elliptical exercise at home.  OBJECTIVE:   PATIENT SURVEYS:  QUICK DASH:     PATIENT SURVEYS:  LEFS  Extreme difficulty/unable (0), Quite a bit of difficulty (1), Moderate difficulty (2), Little difficulty (3), No difficulty (4) Survey date:  11/17/23 01/12/2024  Any of your usual work, housework or school activities 1 3  2. Usual hobbies, recreational or sporting activities 0 3  3. Getting into/out of the bath 1 4  4. Walking between rooms 1 3  5. Putting on socks/shoes 2 4  6. Squatting  1 3  7. Lifting an object, like a bag of groceries from the floor 1 3  8. Performing light activities around your home 1 4  9. Performing heavy activities around your home 0 3  10. Getting into/out of a car 1 3  11. Walking 2 blocks 0 3  12. Walking 1 mile 0 2  13. Going up/down 10 stairs (1 flight) 1 3  14. Standing for 1 hour 1 3  15.  sitting for 1 hour 4 3  16. Running on even ground 0 2  17. Running on uneven ground 0 1  18. Making sharp turns while running fast 0 1  19. Hopping  0 1  20. Rolling over in bed 3 3  Score total:  18 55 (score significantly increased due to inactivity since breast surgery     FUNCTIONAL TESTS:  5 times sit to stand: 11s no AD lowest table setting at eval; today 01/12/2024, completed in 10 sec 2 minute walk test: 384' no AD at eval; 330' today 01/12/2024; limited by pain   GAIT: Distance walked: TWO MINUTE WALK TEST *individual walks without assistance for 2 minutes and the distance is measured *start timing when the individual is instructed to "Go" *stop timing at 2 minutes *assistive devices can be used but should be kept consistent and  documented from test to test  *if physical assistance is required to walk, this should not be performed * a measuring wheel is helpful to determine distance walked *should be performed at the fastest speed possible   GenderAge in years (# in study)Mean distance in  meters Female              18-54 (260)                         200.9 55-59 (23)  191.0 60-64 (29)                           179.1 (587 feet) 65-69 (22)                           184.2 70-74 (32)                           172.2 75-79 (19)                           157.6 80-85 (32)                           144.1   Female          18-54 (539)                         183.0 55-59 (30)                           176.4 60-64 (48)                           166.4 65-69 (22)                           155.2 70-74 (33)                           145.9 75-79 (14)                           140.9 80-85 (34)                           134.3   Assistive device utilized: None Level of assistance: Complete Independence  Comments: EVAL: trunk flexion, trunk lateral sway for limb advancement, increased step length at Rt with increased pain with attempts to shorten step, decreased cadence 01/12/2024: Slightly flexed posture; very antalgic gait  OBSERVATIONS: Right axillary and breast incisions both have healed well with some moderate scar tissue tightness present. Slightly tender to palpation at incisions and mild lateral breast edema present. One axillary cord present deep in axilla.  POSTURE:  Forward head, rounded shoulders  LYMPHEDEMA ASSESSMENT:   UPPER EXTREMITY AROM/PROM:   A/PROM RIGHT   Eval Addendum - added after eval 11/24/23   RIGHT 01/12/2024  Shoulder extension 70 59  Shoulder flexion 153 144  Shoulder abduction 175 150  Shoulder internal rotation 80 72  Shoulder external rotation 75 82                          (Blank rows = not tested)   A/PROM LEFT   Eval Addendum - added after eval 11/24/23  Shoulder extension 58  Shoulder flexion 163  Shoulder abduction 170  Shoulder internal rotation 75  Shoulder external rotation 95                          (Blank rows = not tested)   UPPER EXTREMITY  STRENGTH: WFL   LYMPHEDEMA ASSESSMENTS (in cm):     LANDMARK RIGHT   Eval Addendum - added after eval 11/24/23 RIGHT 01/12/2024  10 cm proximal to olecranon process 33.7 32.6  Olecranon process 27.6 27.2  10 cm proximal to ulnar styloid process 21.8 21.9  Just proximal to ulnar styloid process 16.1 14.6  Across hand at thumb web space 19.5 18.2  At base of 2nd digit 6.2 5.8  (Blank rows = not tested)   LANDMARK LEFT   Eval Addendum - added after eval 11/24/23 LEFT 01/12/2024  10 cm proximal to olecranon process 35.2 35.1  Olecranon process 30 27.3  10 cm proximal to ulnar styloid process 22.5 22.8  Just proximal to ulnar styloid process 16.2 14.7  Across hand at thumb web space 20.2 17.9  At base of 2nd digit 6.5 6  (Blank rows = not tested)  Surgery type/Date: 12/14/2023 right lumpectomy and sentinel node biopsy Number of lymph nodes removed: 2 Current/past treatment (chemo, radiation, hormone therapy): none Other symptoms:  Heaviness/tightness No Pain No Pitting edema No Infections No Decreased scar mobility Yes Stemmer sign No   TODAY'S TREATMENT: 02/08/24: Therapeutic Exercises NuStep Level 5 x 10 mins total: LE 6/ UE 10 x (UE for 5:30 mins only) monitoring pt throughout Roll yellow ball up wall into Rt UE abd x 10 for end ROM stretch Seated HS stretch, then bil figure 4 piriformis stretch each leg x 3 reps, 20 sec holds Therapeutic Activities  Free Motion Machine: Standing on purple airex for stability with core engaged for scap retract 2 x 12 with 7#; then with arms of machine at setting 5, bil UE ext x 12 with 3# Wall squats with core engaged x 10, 5 sec holds Leg Press 70# 2 x 12 Manual Therapy P/ROM to Rt shoulder into flex, abd and D2 with scapular depression throughout by therapist MFR to cording in Rt axilla, cording not palpable today STM to scar tissue in axilla and at breast incisions  02/01/24: Therapeutic Exercises NuStep Level 5 x 10 mins total: LE 6/ UE 10 x for 4:30 mins only Squats in front of  mat table x 10 with demo for correct technique Seated Rt LAQ 4# x 10, 5 sec holds, then same with er 2 x 5, 5 sec holds Seated figure 4 piriformis stretch x 3 reps, 30 sec holds, pt reports Rt knee feels looser with this stretch today Supine Rt SLR with quad set x 10, 3 sec holds and slow eccentrically, then same with er x 10; these were very challenging for pt due to weakness not pain Bridging x 10, with VC's to slow eccentric motion Seated EOB for bil HS stretch x 3 reps, 30 sec holds Manual Therapy P/ROM to Rt shoulder into flex, abd and D2 with scapular depression throughout by therapist MFR to cording in Rt axilla, cording much less palpable overall and end motions improved   01/30/24: Therapeutic Exercises and Activities  NuStep Level 5 x 5 mins, Level 4 x 5 mins for 10 mins total: LE 7/ UE 9 x for 3:30 mins only Squats with purple ball squeeze in front of chair x 10, mild pain, but did have increased popping so stopped after 10 reps In // bars for following: Green theraband for bil hip 3 way raises x 10 each, tried SLS on blue oval for increased stability with ex, but this caused increased medial knee pain in each direction so stopped and just did on level  surface Seated bil HS stretch and then figure 4 piriformis stretch x 2 reps, 20-30 sec holds each Bil leg press 80# x 20 with purple ball squeeze throughout for focus on VMO strengthening to help improve stability, no pain with this Roll yellow ball up wall into Rt UE abd x 10 with VC's to remind pt of correct technique Manual Therapy P/ROM to Rt shoulder into flex, abd and D2 with scapular depression throughout by therapist MFR to cording in Rt axilla, cording was very mildly palpable today, mostly scar tissue. Demonstrated how pt can perform self MFR with end ROM stretch in doorway, she verbalized understanding.         PATIENT EDUCATION:  Education details: Rt LE strength in sitting and supine Person educated:  Patient Education method: Explanation, demonstration, handout Education comprehension: verbalized understanding and returned demonstration  HOME EXERCISE PROGRAM: Reviewed previously given post op HEP. Supine scapular series Rt LE strength in NWB for pain free motion   ASSESSMENT:  CLINICAL IMPRESSION: Pt had increased fatigue today but conts to work hard. Enocouraged rest breaks between sets. Added leg press for LE strength in pain free motion along with wall squats for functional strength.  Then continued with manual therapy woking to decrease scar tissue.  Pt will benefit from skilled therapeutic intervention to improve on the following deficits: Decreased knowledge of precautions, impaired UE functional use, pain, decreased ROM, postural dysfunction.   PT treatment/interventions: ADL/Self care home management, [phone]- PT Re-evaluation, 97110-Therapeutic exercises, 97530- Therapeutic activity, [id number]- Neuromuscular re-education, [phone]- Self Care, [id number]- Manual therapy, [phone]- Gait training, [phone]- Vasopneumatic device, Patient/Family education, Balance training, Stair training, Taping, Scar mobilization, Moist heat, and heat/ice only for right LE   GOALS: Goals reviewed with patient? Yes  GOALS MET AT BREAST CANCER EVAL:  GOALS Name Target Date Goal status  1 Pt will be able to verbalize understanding of pertinent lymphedema risk reduction practices relevant to her dx specifically related to skin care.  Baseline:  No knowledge Eval Achieved at eval  2 Pt will be able to return demo and/or verbalize understanding of the post op HEP related to regaining shoulder ROM. Baseline:  No knowledge Eval Achieved at eval  3 Pt will be able to verbalize understanding of the importance of viewing the post op After Breast CA Class video for further lymphedema risk reduction education and therapeutic exercise.  Baseline:  No knowledge Eval Achieved at eval   LONG TERM GOALS FOR BREAST CANCER:    GOALS Name Target Date  Goal status  1 Pt will demonstrate she has regained full shoulder ROM and function post operatively compared to baselines.  Baseline: 02/09/2024 Ongoing  2 Patient will improve DASH score to be zero for improved overall UE function. 02/09/2024 INITIAL  3 Patient will verbalize good understanding of lymphedema risk reduction practices 02/09/2024 INITIAL   SHORT TERM GOALS for hamstring strain: Target date: 12/15/23 (revised 01/12/2024 for target date 02/09/2024) Pt to be I with HEP for carry over and continuing recommendations for improved outcomes.   Baseline: Goal status: met 9/30   2.  Pt to demonstrate full ROM of Rt hip without pain for improved gait mechanics to tolerate return to PLOF for walking.  Baseline:  Goal status: partially met 01/12/2024   3.  Pt to demonstrate x10 sit to stands without pain or compensation for improved hip strength for walking. Baseline:  Goal status: met 9/30   4.  Pt to demonstrate 5/5 bil hip strength without pain for  improved ability to transfer from ground to standing to return to gardening Baseline:  Goal status: partially met, knee pain affects transfers; 01/12/2024 not tested due to increased pain     LONG TERM GOALS for hamstring strain: Target date: 01/12/24 (revised 01/12/2024 with new target date of 03/08/2024)   Pt to be I with advanced HEP for carry over and continuing recommendations for improved outcomes.   Baseline:  Goal status: Ongoing 01/12/2024   2.  Pt to report ability to tolerate walking at least 1 mile with no more than 1/10 pain for improved QOL.  Baseline:  Goal status: Ongoing 01/12/2024   3.  Pt to demonstrate improved within age related norms due to improved cadence and mechanics of gait for improved ability to complete grocery errands.  Baseline:  Goal status: Ongoing 01/12/2024   4.  Pt to demonstrate improved 5xSTS to no more than 7s for decreased fall risk.  Baseline:  Goal  status: Ongoing (10 sec 01/12/2024)      PLAN:  PT FREQUENCY/DURATION: 2x/week for 8 weeks  PLAN FOR NEXT SESSION: How are new R LE exercises going? Cont NuStep (only intermittent with UE's as she has had LN's removed and 2 breast surgeries now), right leg strengthening; consider right knee kinesiotaping for pain? Manual techniques to increase right shoulder flexion and abduction and to address axillary cording prn.   [facility]  [address]  [place] [address]  [phone]   Long Arc Quad    Straighten operated leg and try to hold it __3-5__ seconds. Use __4__ lbs on ankle.  If no light weight available, just hold longer. Then also do same with foot rolled out.  Repeat __1__ times. Do __2__ sessions a day.  Straight Leg Raise    Tighten stomach and thigh muscle and slowly raise right leg about half way up.  Then do same with foot rolled out. Repeat _10___ times per set. Do _1-2___ sets per session. Do __2__ sessions per day.   Bridging (Buttocks / Hamstring Stretch)    Lie on back, knees bent, feet flat on surface. Breathe in. Raise buttocks and hold position _3-5__ seconds, breathing out through pursed lips. Return to start position, breathing in. Remember: do not hold your breath. Repeat _10__ times. Do _1-2__ sessions per day. Variation: Lie on bed, not on floor.  Hamstring Stretch    Inhale and straighten spine. Exhale and lean forward toward extended leg. Hold position for _20-30__ seconds. Inhale and come back to center. Repeat with other leg extended. Repeat _3__ times, alternating legs. Do _3__ times per day.   [facility] [phone]

## 2024-02-13 ENCOUNTER — Ambulatory Visit
Admission: RE | Admit: 2024-02-13 | Discharge: 2024-02-13 | Disposition: A | Source: Ambulatory Visit | Attending: Radiation Oncology

## 2024-02-13 ENCOUNTER — Other Ambulatory Visit: Payer: Self-pay

## 2024-02-13 DIAGNOSIS — C50411 Malignant neoplasm of upper-outer quadrant of right female breast: Secondary | ICD-10-CM | POA: Insufficient documentation

## 2024-02-13 DIAGNOSIS — Z170 Estrogen receptor positive status [ER+]: Secondary | ICD-10-CM | POA: Insufficient documentation

## 2024-02-13 DIAGNOSIS — Z510 Encounter for antineoplastic radiation therapy: Secondary | ICD-10-CM | POA: Insufficient documentation

## 2024-02-13 LAB — RAD ONC ARIA SESSION SUMMARY
Course Elapsed Days: 11
Plan Fractions Treated to Date: 7
Plan Prescribed Dose Per Fraction: 1.8 Gy
Plan Total Fractions Prescribed: 28
Plan Total Prescribed Dose: 50.4 Gy
Reference Point Dosage Given to Date: 12.6 Gy
Reference Point Session Dosage Given: 1.8 Gy
Session Number: 7

## 2024-02-14 ENCOUNTER — Other Ambulatory Visit: Payer: Self-pay

## 2024-02-14 ENCOUNTER — Ambulatory Visit
Admission: RE | Admit: 2024-02-14 | Discharge: 2024-02-14 | Disposition: A | Source: Ambulatory Visit | Attending: Radiation Oncology

## 2024-02-14 DIAGNOSIS — Z510 Encounter for antineoplastic radiation therapy: Secondary | ICD-10-CM | POA: Diagnosis not present

## 2024-02-14 LAB — RAD ONC ARIA SESSION SUMMARY
Course Elapsed Days: 12
Plan Fractions Treated to Date: 8
Plan Prescribed Dose Per Fraction: 1.8 Gy
Plan Total Fractions Prescribed: 28
Plan Total Prescribed Dose: 50.4 Gy
Reference Point Dosage Given to Date: 14.4 Gy
Reference Point Session Dosage Given: 1.8 Gy
Session Number: 8

## 2024-02-15 ENCOUNTER — Ambulatory Visit
Admission: RE | Admit: 2024-02-15 | Discharge: 2024-02-15 | Disposition: A | Source: Ambulatory Visit | Attending: Radiation Oncology

## 2024-02-15 ENCOUNTER — Other Ambulatory Visit: Payer: Self-pay

## 2024-02-15 ENCOUNTER — Ambulatory Visit: Attending: General Surgery

## 2024-02-15 DIAGNOSIS — M79604 Pain in right leg: Secondary | ICD-10-CM | POA: Insufficient documentation

## 2024-02-15 DIAGNOSIS — M25611 Stiffness of right shoulder, not elsewhere classified: Secondary | ICD-10-CM | POA: Insufficient documentation

## 2024-02-15 DIAGNOSIS — Z483 Aftercare following surgery for neoplasm: Secondary | ICD-10-CM | POA: Insufficient documentation

## 2024-02-15 DIAGNOSIS — Z170 Estrogen receptor positive status [ER+]: Secondary | ICD-10-CM | POA: Diagnosis present

## 2024-02-15 DIAGNOSIS — C50411 Malignant neoplasm of upper-outer quadrant of right female breast: Secondary | ICD-10-CM | POA: Insufficient documentation

## 2024-02-15 DIAGNOSIS — R279 Unspecified lack of coordination: Secondary | ICD-10-CM | POA: Insufficient documentation

## 2024-02-15 DIAGNOSIS — R269 Unspecified abnormalities of gait and mobility: Secondary | ICD-10-CM | POA: Insufficient documentation

## 2024-02-15 DIAGNOSIS — R293 Abnormal posture: Secondary | ICD-10-CM | POA: Insufficient documentation

## 2024-02-15 DIAGNOSIS — M6281 Muscle weakness (generalized): Secondary | ICD-10-CM | POA: Insufficient documentation

## 2024-02-15 DIAGNOSIS — Z510 Encounter for antineoplastic radiation therapy: Secondary | ICD-10-CM | POA: Diagnosis not present

## 2024-02-15 LAB — RAD ONC ARIA SESSION SUMMARY
Course Elapsed Days: 13
Plan Fractions Treated to Date: 9
Plan Prescribed Dose Per Fraction: 1.8 Gy
Plan Total Fractions Prescribed: 28
Plan Total Prescribed Dose: 50.4 Gy
Reference Point Dosage Given to Date: 16.2 Gy
Reference Point Session Dosage Given: 1.8 Gy
Session Number: 9

## 2024-02-15 NOTE — Therapy (Signed)
 OUTPATIENT PHYSICAL THERAPY BREAST CANCER TREATMENT   Patient Name: Amanda Herring MRN: [id number] DOB:21-Jul-1955, 68 y.o., female Today's Date: 02/15/2024  END OF SESSION:  PT End of Session - 02/15/24 0803     Visit Number 19    Number of Visits 19    Date for Recertification  03/08/24    Authorization Type [employer]    Authorization Time Period Med necessity; no visit limit    Progress Note Due on Visit 20    PT Start Time 0800    PT Stop Time 0844   pt reports needing to leave early   PT Time Calculation (min) 44 min    Activity Tolerance Patient tolerated treatment well    Behavior During Therapy [facility] for tasks assessed/performed          Past Medical History:  Diagnosis Date   Cancer (HCC)    Family history of adverse reaction to anesthesia    per patient her dad had post n/v   Family history of bladder cancer    GERD (gastroesophageal reflux disease)    Hepatitis C    s/p treatment 2012   History of blood transfusion 1981   with delivery w/baby   History of kidney stones    passed stones   Hypertension    Hypothyroidism    Pneumonia    x 1 yrs ago   PONV (postoperative nausea and vomiting)    Past Surgical History:  Procedure Laterality Date   ABDOMINAL HYSTERECTOMY     BREAST BIOPSY Right 11/04/2023   US  RT BREAST BX W LOC DEV 1ST LESION IMG BX SPEC US  GUIDE 11/04/2023 [facility]   BREAST EXCISIONAL BIOPSY Bilateral    1981/1980   BREAST LUMPECTOMY Left    1998   BREAST LUMPECTOMY Right 12/14/2023   Procedure: BREAST LUMPECTOMY;  Surgeon: Aron Shoulders, MD;  Location: [facility] OR;  Service: General;  Laterality: Right;   BREAST SURGERY     augmentation   RE-EXCISION OF BREAST LUMPECTOMY Right 12/29/2023   Procedure: EXCISION, LESION, BREAST;  Surgeon: Aron Shoulders, MD;  Location: [facility] OR;  Service: General;  Laterality: Right;  RE-EXCISION RIGHT BREAST LUMPECTOMY   SENTINEL NODE BIOPSY Right 12/14/2023   Procedure: BIOPSY, LYMPH NODE,  SENTINEL;  Surgeon: Aron Shoulders, MD;  Location: [facility] OR;  Service: General;  Laterality: Right;  SENTINEL LYMPH NODE BIOPSY   Patient Active Problem List   Diagnosis Date Noted   Genetic testing 11/28/2023   Family history of bladder cancer    Malignant neoplasm of upper-outer quadrant of right breast in female, estrogen receptor positive (HCC) 11/11/2023    REFERRING PROVIDER: Dr. Shoulders Aron    REFERRING DIAG: Right breast cancer; RIGHT PES BURSA/RIGHT HAMSTRING STRAIN   THERAPY DIAG:  Muscle weakness (generalized)  Pain in right leg  Unspecified lack of coordination  Abnormal posture  Abnormality of gait and mobility  Malignant neoplasm of upper-outer quadrant of right breast in female, estrogen receptor positive (HCC)  Stiffness of right shoulder, not elsewhere classified  Aftercare following surgery for neoplasm  Rationale for Evaluation and Treatment: Rehabilitation  ONSET DATE: 12/14/2023  SUBJECTIVE:  SUBJECTIVE STATEMENT: I can tell my skin is feeling more sensitive in my Rt axilla/trunk. I'm just more aware of it rubbing more. I know my Rt knee isn't getting better and I want an MRI but I'm sure I need to wait a period of time after completing radiation. I'll ask the techs at my appt soon how long I'd have to wait to have an MRI of my knee like Dr. Joane suggested.   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 She had a right lumpectomy and sentinel node biopsy (2 negative nodes) on 12/14/2023 and then a re-excision on 12/29/2023 to obtain clear margins. It is ER/PR positive and HER2 negative with a Ki67 of 1%. She had a left lumpectomy in 1998 due to breast cancer at [facility].   PATIENT GOALS:  Reassess how my  recovery is going related to arm function, pain, and swelling. Decrease right knee pain so I can walk more and be more active.  PAIN:  Are you having pain? No, skin is just irritated in Rt axilla from radiation  PRECAUTIONS: Recent Surgery, right UE Lymphedema risk  RED FLAGS: None   ACTIVITY LEVEL / LEISURE: Only doing exercises given by PT and doing a seated elliptical exercise at home.  OBJECTIVE:   PATIENT SURVEYS:  QUICK DASH:     PATIENT SURVEYS:  LEFS  Extreme difficulty/unable (0), Quite a bit of difficulty (1), Moderate difficulty (2), Little difficulty (3), No difficulty (4) Survey date:  11/17/23 01/12/2024  Any of your usual work, housework or school activities 1 3  2. Usual hobbies, recreational or sporting activities 0 3  3. Getting into/out of the bath 1 4  4. Walking between rooms 1 3  5. Putting on socks/shoes 2 4  6. Squatting  1 3  7. Lifting an object, like a bag of groceries from the floor 1 3  8. Performing light activities around your home 1 4  9. Performing heavy activities around your home 0 3  10. Getting into/out of a car 1 3  11. Walking 2 blocks 0 3  12. Walking 1 mile 0 2  13. Going up/down 10 stairs (1 flight) 1 3  14. Standing for 1 hour 1 3  15.  sitting for 1 hour 4 3  16. Running on even ground 0 2  17. Running on uneven ground 0 1  18. Making sharp turns while running fast 0 1  19. Hopping  0 1  20. Rolling over in bed 3 3  Score total:  18 55 (score significantly increased due to inactivity since breast surgery     FUNCTIONAL TESTS:  5 times sit to stand: 11s no AD lowest table setting at eval; today 01/12/2024, completed in 10 sec 2 minute walk test: 384' no AD at eval; 330' today 01/12/2024; limited by pain   GAIT: Distance walked: TWO MINUTE WALK TEST *individual walks without assistance for 2 minutes and the distance is measured *start timing when the individual is instructed to "Go" *stop timing at 2 minutes *assistive  devices can be used but should be kept consistent and  documented from test to test  *if physical assistance is required to walk, this should not be performed * a measuring wheel is helpful to determine distance walked *should be performed at the fastest speed possible   GenderAge in years (# in study)Mean distance in meters Female  18-54 (260)                         200.9 55-59 (23)                           191.0 60-64 (29)                           179.1 (587 feet) 65-69 (22)                           184.2 70-74 (32)                           172.2 75-79 (19)                           157.6 80-85 (32)                           144.1   Female          18-54 (539)                         183.0 55-59 (30)                           176.4 60-64 (48)                           166.4 65-69 (22)                           155.2 70-74 (33)                           145.9 75-79 (14)                           140.9 80-85 (34)                           134.3   Assistive device utilized: None Level of assistance: Complete Independence  Comments: EVAL: trunk flexion, trunk lateral sway for limb advancement, increased step length at Rt with increased pain with attempts to shorten step, decreased cadence 01/12/2024: Slightly flexed posture; very antalgic gait  OBSERVATIONS: Right axillary and breast incisions both have healed well with some moderate scar tissue tightness present. Slightly tender to palpation at incisions and mild lateral breast edema present. One axillary cord present deep in axilla.  POSTURE:  Forward head, rounded shoulders  LYMPHEDEMA ASSESSMENT:   UPPER EXTREMITY AROM/PROM:   A/PROM RIGHT   Eval Addendum - added after eval 11/24/23   RIGHT 01/12/2024  Shoulder extension 70 59  Shoulder flexion 153 144  Shoulder abduction 175 150  Shoulder internal rotation 80 72  Shoulder external rotation 75 82                          (Blank rows = not tested)    A/PROM LEFT   Eval Addendum - added after eval 11/24/23  Shoulder extension 58  Shoulder flexion 163  Shoulder abduction 170  Shoulder internal rotation 75  Shoulder external rotation 95                          (Blank rows = not tested)   UPPER EXTREMITY STRENGTH: WFL   LYMPHEDEMA ASSESSMENTS (in cm):    LANDMARK RIGHT   Eval Addendum - added after eval 11/24/23 RIGHT 01/12/2024  10 cm proximal to olecranon process 33.7 32.6  Olecranon process 27.6 27.2  10 cm proximal to ulnar styloid process 21.8 21.9  Just proximal to ulnar styloid process 16.1 14.6  Across hand at thumb web space 19.5 18.2  At base of 2nd digit 6.2 5.8  (Blank rows = not tested)   LANDMARK LEFT   Eval Addendum - added after eval 11/24/23 LEFT 01/12/2024  10 cm proximal to olecranon process 35.2 35.1  Olecranon process 30 27.3  10 cm proximal to ulnar styloid process 22.5 22.8  Just proximal to ulnar styloid process 16.2 14.7  Across hand at thumb web space 20.2 17.9  At base of 2nd digit 6.5 6  (Blank rows = not tested)  Surgery type/Date: 12/14/2023 right lumpectomy and sentinel node biopsy Number of lymph nodes removed: 2 Current/past treatment (chemo, radiation, hormone therapy): none Other symptoms:  Heaviness/tightness No Pain No Pitting edema No Infections No Decreased scar mobility Yes Stemmer sign No   TODAY'S TREATMENT: 02/15/24: Therapeutic Exercises NuStep Level 5 x 10 mins total: LE 6/ UE 10 x (UE for 3:30 mins only) monitoring pt throughout, stopped UE due to pt feeling her radiated skin rubbing in axilla Pulleys into flex x 3 min and abd x 2 min reminding pt to decrease Rt scapular compensation Roll yellow ball up wall into Rt UE abd and flex x 10 each Doorway Pectoralis Stretch (at end of session) x 4 reps, 10-20 sec holds returning therapist demo and VC's to adjust height of arms to allow for comfortable stretch that doesn't pull on radiated skin. Therapeutic Activities   Free Motion Machine: Standing on purple airex for stability with core engaged for scap retract 2 x 10 with 7#; then with arms of machine at setting 5, bil UE ext 2 x 12 with 3# Forearms on wall with yellow theraband for following: Wall walks 2 up/2 down x 5 reps, then scap retract x 5 each returning therapist demo  Wall squats with core engaged x 10, 5 sec holds, VC's to be aware of evenly weight bearing and feeling bil scap and gluts on wall to further assist cuing for even WB  02/08/24: Therapeutic Exercises NuStep Level 5 x 10 mins total: LE 6/ UE 10 x (UE for 5:30 mins only) monitoring pt throughout Roll yellow ball up wall into Rt UE abd x 10 for end ROM stretch Seated HS stretch, then bil figure 4 piriformis stretch each leg x 3 reps, 20 sec holds Therapeutic Activities  Free Motion Machine: Standing on purple airex for stability with core engaged for scap retract 2 x 12 with 7#; then with arms of machine at setting 5, bil UE ext x 12 with 3# Wall squats with core engaged x 10, 5 sec holds Leg Press 70# 2 x 12 Manual Therapy P/ROM to Rt shoulder into flex, abd and D2 with scapular depression throughout by therapist MFR to cording in Rt axilla, cording not palpable today STM to scar tissue in axilla and at breast incisions  02/01/24: Therapeutic Exercises NuStep Level 5 x 10 mins total: LE 6/ UE 10 x for 4:30 mins only Squats in front of mat table x 10 with demo for correct technique Seated Rt LAQ 4# x 10, 5 sec holds, then same with er 2 x 5, 5 sec holds Seated figure 4 piriformis stretch x 3 reps, 30 sec holds, pt reports Rt knee feels looser with this stretch today Supine Rt SLR with quad set x 10, 3 sec holds and slow eccentrically, then same with er x 10; these were very challenging for pt due to weakness not pain Bridging x 10, with VC's to slow eccentric motion Seated EOB for bil HS stretch x 3 reps, 30 sec holds Manual Therapy P/ROM to Rt shoulder into flex, abd and D2 with  scapular depression throughout by therapist MFR to cording in Rt axilla, cording much less palpable overall and end motions improved       PATIENT EDUCATION:  Education details: Rt LE strength in sitting and supine Person educated: Patient Education method: Explanation, demonstration, handout Education comprehension: verbalized understanding and returned demonstration  HOME EXERCISE PROGRAM: Reviewed previously given post op HEP. Supine scapular series Rt LE strength in NWB for pain free motion   ASSESSMENT:  CLINICAL IMPRESSION: Pt reports starting to notice some skin irritation from radiation so was mindful of this with exercises and manual therapy. Progressed scapular strengthening with forearms on wall as pt reports she feels she has been struggling more with keeping her shoulder down with OH UE reaching. Pt up for renewal next session and may benefit from continuing physical therapy at least once a week as she has 24 radiation sessions left and further skin and fascial changes are expected. Will discuss with PT.   Pt will benefit from skilled therapeutic intervention to improve on the following deficits: Decreased knowledge of precautions, impaired UE functional use, pain, decreased ROM, postural dysfunction.   PT treatment/interventions: ADL/Self care home management, [phone]- PT Re-evaluation, 97110-Therapeutic exercises, 97530- Therapeutic activity, [id number]- Neuromuscular re-education, [phone]- Self Care, [id number]- Manual therapy, [phone]- Gait training, [phone]- Vasopneumatic device, Patient/Family education, Balance training, Stair training, Taping, Scar mobilization, Moist heat, and heat/ice only for right LE   GOALS: Goals reviewed with patient? Yes  GOALS MET AT BREAST CANCER EVAL:  GOALS Name Target Date Goal status  1 Pt will be able to verbalize understanding of pertinent lymphedema risk reduction practices relevant to her dx specifically related to skin care.  Baseline:  No  knowledge Eval Achieved at eval  2 Pt will be able to return demo and/or verbalize understanding of the post op HEP related to regaining shoulder ROM. Baseline:  No knowledge Eval Achieved at eval  3 Pt will be able to verbalize understanding of the importance of viewing the post op After Breast CA Class video for further lymphedema risk reduction education and therapeutic exercise.  Baseline:  No knowledge Eval Achieved at eval   LONG TERM GOALS FOR BREAST CANCER:   GOALS Name Target Date  Goal status  1 Pt will demonstrate she has regained full shoulder ROM and function post operatively compared to baselines.  Baseline: 02/09/2024 Ongoing  2 Patient will improve DASH score to be zero for improved overall UE function. 02/09/2024 INITIAL  3 Patient will verbalize good understanding of lymphedema risk reduction practices 02/09/2024 INITIAL   SHORT TERM GOALS for hamstring strain: Target date: 12/15/23 (revised 01/12/2024 for target date 02/09/2024) Pt to be I with HEP for carry over and  continuing recommendations for improved outcomes.   Baseline: Goal status: met 9/30   2.  Pt to demonstrate full ROM of Rt hip without pain for improved gait mechanics to tolerate return to PLOF for walking.  Baseline:  Goal status: partially met 01/12/2024   3.  Pt to demonstrate x10 sit to stands without pain or compensation for improved hip strength for walking. Baseline:  Goal status: met 9/30   4.  Pt to demonstrate 5/5 bil hip strength without pain for improved ability to transfer from ground to standing to return to gardening Baseline:  Goal status: partially met, knee pain affects transfers; 01/12/2024 not tested due to increased pain     LONG TERM GOALS for hamstring strain: Target date: 01/12/24 (revised 01/12/2024 with new target date of 03/08/2024)   Pt to be I with advanced HEP for carry over and continuing recommendations for improved outcomes.   Baseline:  Goal status: Ongoing  01/12/2024   2.  Pt to report ability to tolerate walking at least 1 mile with no more than 1/10 pain for improved QOL.  Baseline:  Goal status: Ongoing 01/12/2024   3.  Pt to demonstrate improved within age related norms due to improved cadence and mechanics of gait for improved ability to complete grocery errands.  Baseline:  Goal status: Ongoing 01/12/2024   4.  Pt to demonstrate improved 5xSTS to no more than 7s for decreased fall risk.  Baseline:  Goal status: Ongoing (10 sec 01/12/2024)      PLAN:  PT FREQUENCY/DURATION: 2x/week for 8 weeks  PLAN FOR NEXT SESSION: Assess goals next and possibly renew 1-2x/wk for up to 6 more weeks due to radiation; How are new R LE exercises going? Cont NuStep (LE's only due to ), right leg strengthening; Manual techniques to increase right shoulder flexion and abduction and to address axillary cording prn.   [facility]  [address], [address]  [place] [address]  [phone]   Long Arc Quad    Straighten operated leg and try to hold it __3-5__ seconds. Use __4__ lbs on ankle.  If no light weight available, just hold longer. Then also do same with foot rolled out.  Repeat __1__ times. Do __2__ sessions a day.  Straight Leg Raise    Tighten stomach and thigh muscle and slowly raise right leg about half way up.  Then do same with foot rolled out. Repeat _10___ times per set. Do _1-2___ sets per session. Do __2__ sessions per day.   Bridging (Buttocks / Hamstring Stretch)    Lie on back, knees bent, feet flat on surface. Breathe in. Raise buttocks and hold position _3-5__ seconds, breathing out through pursed lips. Return to start position, breathing in. Remember: do not hold your breath. Repeat _10__ times. Do _1-2__ sessions per day. Variation: Lie on bed, not on floor.  Hamstring Stretch    Inhale and straighten spine. Exhale and lean forward toward extended leg. Hold position for _20-30__  seconds. Inhale and come back to center. Repeat with other leg extended. Repeat _3__ times, alternating legs. Do _3__ times per day.   [facility] [phone]

## 2024-02-16 ENCOUNTER — Other Ambulatory Visit: Payer: Self-pay

## 2024-02-16 ENCOUNTER — Ambulatory Visit
Admission: RE | Admit: 2024-02-16 | Discharge: 2024-02-16 | Disposition: A | Source: Ambulatory Visit | Attending: Radiation Oncology

## 2024-02-16 DIAGNOSIS — Z510 Encounter for antineoplastic radiation therapy: Secondary | ICD-10-CM | POA: Diagnosis not present

## 2024-02-16 LAB — RAD ONC ARIA SESSION SUMMARY
Course Elapsed Days: 14
Plan Fractions Treated to Date: 10
Plan Prescribed Dose Per Fraction: 1.8 Gy
Plan Total Fractions Prescribed: 28
Plan Total Prescribed Dose: 50.4 Gy
Reference Point Dosage Given to Date: 18 Gy
Reference Point Session Dosage Given: 1.8 Gy
Session Number: 10

## 2024-02-17 ENCOUNTER — Ambulatory Visit: Admitting: Rehabilitation

## 2024-02-17 ENCOUNTER — Other Ambulatory Visit: Payer: Self-pay

## 2024-02-17 ENCOUNTER — Ambulatory Visit
Admission: RE | Admit: 2024-02-17 | Discharge: 2024-02-17 | Disposition: A | Source: Ambulatory Visit | Attending: Radiation Oncology

## 2024-02-17 ENCOUNTER — Ambulatory Visit: Admission: RE | Admit: 2024-02-17 | Discharge: 2024-02-17 | Attending: Radiation Oncology

## 2024-02-17 DIAGNOSIS — Z510 Encounter for antineoplastic radiation therapy: Secondary | ICD-10-CM | POA: Diagnosis not present

## 2024-02-17 LAB — RAD ONC ARIA SESSION SUMMARY
Course Elapsed Days: 15
Plan Fractions Treated to Date: 11
Plan Prescribed Dose Per Fraction: 1.8 Gy
Plan Total Fractions Prescribed: 28
Plan Total Prescribed Dose: 50.4 Gy
Reference Point Dosage Given to Date: 19.8 Gy
Reference Point Session Dosage Given: 1.8 Gy
Session Number: 11

## 2024-02-20 ENCOUNTER — Ambulatory Visit
Admission: RE | Admit: 2024-02-20 | Discharge: 2024-02-20 | Disposition: A | Source: Ambulatory Visit | Attending: Radiation Oncology

## 2024-02-20 ENCOUNTER — Other Ambulatory Visit: Payer: Self-pay

## 2024-02-20 DIAGNOSIS — Z510 Encounter for antineoplastic radiation therapy: Secondary | ICD-10-CM | POA: Diagnosis not present

## 2024-02-20 LAB — RAD ONC ARIA SESSION SUMMARY
Course Elapsed Days: 18
Plan Fractions Treated to Date: 12
Plan Prescribed Dose Per Fraction: 1.8 Gy
Plan Total Fractions Prescribed: 28
Plan Total Prescribed Dose: 50.4 Gy
Reference Point Dosage Given to Date: 21.6 Gy
Reference Point Session Dosage Given: 1.8 Gy
Session Number: 12

## 2024-02-21 ENCOUNTER — Other Ambulatory Visit: Payer: Self-pay

## 2024-02-21 ENCOUNTER — Ambulatory Visit
Admission: RE | Admit: 2024-02-21 | Discharge: 2024-02-21 | Disposition: A | Source: Ambulatory Visit | Attending: Radiation Oncology

## 2024-02-21 DIAGNOSIS — Z510 Encounter for antineoplastic radiation therapy: Secondary | ICD-10-CM | POA: Diagnosis not present

## 2024-02-21 LAB — RAD ONC ARIA SESSION SUMMARY
Course Elapsed Days: 19
Plan Fractions Treated to Date: 13
Plan Prescribed Dose Per Fraction: 1.8 Gy
Plan Total Fractions Prescribed: 28
Plan Total Prescribed Dose: 50.4 Gy
Reference Point Dosage Given to Date: 23.4 Gy
Reference Point Session Dosage Given: 1.8 Gy
Session Number: 13

## 2024-02-22 ENCOUNTER — Ambulatory Visit
Admission: RE | Admit: 2024-02-22 | Discharge: 2024-02-22 | Disposition: A | Discharge status: Home / Self Care | Source: Ambulatory Visit | Attending: Radiation Oncology | Admitting: Radiation Oncology

## 2024-02-22 ENCOUNTER — Other Ambulatory Visit: Payer: Self-pay

## 2024-02-22 DIAGNOSIS — Z510 Encounter for antineoplastic radiation therapy: Secondary | ICD-10-CM | POA: Diagnosis not present

## 2024-02-22 LAB — RAD ONC ARIA SESSION SUMMARY
Course Elapsed Days: 20
Plan Fractions Treated to Date: 14
Plan Prescribed Dose Per Fraction: 1.8 Gy
Plan Total Fractions Prescribed: 28
Plan Total Prescribed Dose: 50.4 Gy
Reference Point Dosage Given to Date: 25.2 Gy
Reference Point Session Dosage Given: 1.8 Gy
Session Number: 14

## 2024-02-23 ENCOUNTER — Other Ambulatory Visit: Payer: Self-pay

## 2024-02-23 ENCOUNTER — Ambulatory Visit
Admission: RE | Admit: 2024-02-23 | Discharge: 2024-02-23 | Disposition: A | Discharge status: Home / Self Care | Source: Ambulatory Visit | Attending: Radiation Oncology | Admitting: Radiation Oncology

## 2024-02-23 DIAGNOSIS — Z510 Encounter for antineoplastic radiation therapy: Secondary | ICD-10-CM | POA: Diagnosis not present

## 2024-02-23 LAB — RAD ONC ARIA SESSION SUMMARY
Course Elapsed Days: 21
Plan Fractions Treated to Date: 15
Plan Prescribed Dose Per Fraction: 1.8 Gy
Plan Total Fractions Prescribed: 28
Plan Total Prescribed Dose: 50.4 Gy
Reference Point Dosage Given to Date: 27 Gy
Reference Point Session Dosage Given: 1.8 Gy
Session Number: 15

## 2024-02-24 ENCOUNTER — Ambulatory Visit
Admission: RE | Admit: 2024-02-24 | Discharge: 2024-02-24 | Disposition: A | Source: Ambulatory Visit | Attending: Radiation Oncology

## 2024-02-24 ENCOUNTER — Other Ambulatory Visit: Payer: Self-pay

## 2024-02-24 DIAGNOSIS — Z510 Encounter for antineoplastic radiation therapy: Secondary | ICD-10-CM | POA: Diagnosis not present

## 2024-02-24 LAB — RAD ONC ARIA SESSION SUMMARY
Course Elapsed Days: 22
Plan Fractions Treated to Date: 16
Plan Prescribed Dose Per Fraction: 1.8 Gy
Plan Total Fractions Prescribed: 28
Plan Total Prescribed Dose: 50.4 Gy
Reference Point Dosage Given to Date: 28.8 Gy
Reference Point Session Dosage Given: 1.8 Gy
Session Number: 16

## 2024-02-26 ENCOUNTER — Ambulatory Visit

## 2024-02-27 ENCOUNTER — Ambulatory Visit
Admission: RE | Admit: 2024-02-27 | Discharge: 2024-02-27 | Disposition: A | Source: Ambulatory Visit | Attending: Radiation Oncology

## 2024-02-27 ENCOUNTER — Other Ambulatory Visit: Payer: Self-pay

## 2024-02-27 DIAGNOSIS — Z510 Encounter for antineoplastic radiation therapy: Secondary | ICD-10-CM | POA: Diagnosis not present

## 2024-02-27 LAB — RAD ONC ARIA SESSION SUMMARY
Course Elapsed Days: 25
Plan Fractions Treated to Date: 17
Plan Prescribed Dose Per Fraction: 1.8 Gy
Plan Total Fractions Prescribed: 28
Plan Total Prescribed Dose: 50.4 Gy
Reference Point Dosage Given to Date: 30.6 Gy
Reference Point Session Dosage Given: 1.8 Gy
Session Number: 17

## 2024-02-28 ENCOUNTER — Other Ambulatory Visit: Payer: Self-pay

## 2024-02-28 ENCOUNTER — Ambulatory Visit
Admission: RE | Admit: 2024-02-28 | Discharge: 2024-02-28 | Disposition: A | Source: Ambulatory Visit | Attending: Radiation Oncology

## 2024-02-28 DIAGNOSIS — Z510 Encounter for antineoplastic radiation therapy: Secondary | ICD-10-CM | POA: Diagnosis not present

## 2024-02-28 LAB — RAD ONC ARIA SESSION SUMMARY
Course Elapsed Days: 26
Plan Fractions Treated to Date: 18
Plan Prescribed Dose Per Fraction: 1.8 Gy
Plan Total Fractions Prescribed: 28
Plan Total Prescribed Dose: 50.4 Gy
Reference Point Dosage Given to Date: 32.4 Gy
Reference Point Session Dosage Given: 1.8 Gy
Session Number: 18

## 2024-02-29 ENCOUNTER — Other Ambulatory Visit: Payer: Self-pay

## 2024-02-29 ENCOUNTER — Ambulatory Visit: Admission: RE | Admit: 2024-02-29 | Discharge: 2024-02-29 | Attending: Radiation Oncology

## 2024-02-29 DIAGNOSIS — Z510 Encounter for antineoplastic radiation therapy: Secondary | ICD-10-CM | POA: Diagnosis not present

## 2024-02-29 LAB — RAD ONC ARIA SESSION SUMMARY
Course Elapsed Days: 27
Plan Fractions Treated to Date: 19
Plan Prescribed Dose Per Fraction: 1.8 Gy
Plan Total Fractions Prescribed: 28
Plan Total Prescribed Dose: 50.4 Gy
Reference Point Dosage Given to Date: 34.2 Gy
Reference Point Session Dosage Given: 1.8 Gy
Session Number: 19

## 2024-03-01 ENCOUNTER — Other Ambulatory Visit: Payer: Self-pay

## 2024-03-01 ENCOUNTER — Ambulatory Visit

## 2024-03-01 DIAGNOSIS — Z510 Encounter for antineoplastic radiation therapy: Secondary | ICD-10-CM | POA: Diagnosis not present

## 2024-03-01 LAB — RAD ONC ARIA SESSION SUMMARY
Course Elapsed Days: 28
Plan Fractions Treated to Date: 20
Plan Prescribed Dose Per Fraction: 1.8 Gy
Plan Total Fractions Prescribed: 28
Plan Total Prescribed Dose: 50.4 Gy
Reference Point Dosage Given to Date: 36 Gy
Reference Point Session Dosage Given: 1.8 Gy
Session Number: 20

## 2024-03-02 ENCOUNTER — Ambulatory Visit

## 2024-03-02 ENCOUNTER — Ambulatory Visit: Admitting: Radiation Oncology

## 2024-03-02 ENCOUNTER — Other Ambulatory Visit: Payer: Self-pay

## 2024-03-02 DIAGNOSIS — Z510 Encounter for antineoplastic radiation therapy: Secondary | ICD-10-CM | POA: Diagnosis not present

## 2024-03-02 LAB — RAD ONC ARIA SESSION SUMMARY
Course Elapsed Days: 29
Plan Fractions Treated to Date: 21
Plan Prescribed Dose Per Fraction: 1.8 Gy
Plan Total Fractions Prescribed: 28
Plan Total Prescribed Dose: 50.4 Gy
Reference Point Dosage Given to Date: 37.8 Gy
Reference Point Session Dosage Given: 1.8 Gy
Session Number: 21

## 2024-03-04 ENCOUNTER — Ambulatory Visit: Admission: RE | Admit: 2024-03-04 | Discharge: 2024-03-04 | Attending: Radiation Oncology

## 2024-03-04 ENCOUNTER — Other Ambulatory Visit: Payer: Self-pay

## 2024-03-04 DIAGNOSIS — Z510 Encounter for antineoplastic radiation therapy: Secondary | ICD-10-CM | POA: Diagnosis not present

## 2024-03-04 LAB — RAD ONC ARIA SESSION SUMMARY
Course Elapsed Days: 31
Plan Fractions Treated to Date: 22
Plan Prescribed Dose Per Fraction: 1.8 Gy
Plan Total Fractions Prescribed: 28
Plan Total Prescribed Dose: 50.4 Gy
Reference Point Dosage Given to Date: 39.6 Gy
Reference Point Session Dosage Given: 1.8 Gy
Session Number: 22

## 2024-03-05 ENCOUNTER — Other Ambulatory Visit: Payer: Self-pay

## 2024-03-05 ENCOUNTER — Ambulatory Visit: Admission: RE | Admit: 2024-03-05 | Discharge: 2024-03-05 | Attending: Radiation Oncology

## 2024-03-05 DIAGNOSIS — Z510 Encounter for antineoplastic radiation therapy: Secondary | ICD-10-CM | POA: Diagnosis not present

## 2024-03-05 LAB — RAD ONC ARIA SESSION SUMMARY
Course Elapsed Days: 32
Plan Fractions Treated to Date: 23
Plan Prescribed Dose Per Fraction: 1.8 Gy
Plan Total Fractions Prescribed: 28
Plan Total Prescribed Dose: 50.4 Gy
Reference Point Dosage Given to Date: 41.4 Gy
Reference Point Session Dosage Given: 1.8 Gy
Session Number: 23

## 2024-03-06 ENCOUNTER — Ambulatory Visit
Admission: RE | Admit: 2024-03-06 | Discharge: 2024-03-06 | Disposition: A | Discharge status: Home / Self Care | Source: Ambulatory Visit | Attending: Radiation Oncology | Admitting: Radiation Oncology

## 2024-03-06 ENCOUNTER — Other Ambulatory Visit: Payer: Self-pay

## 2024-03-06 DIAGNOSIS — Z510 Encounter for antineoplastic radiation therapy: Secondary | ICD-10-CM | POA: Diagnosis not present

## 2024-03-06 LAB — RAD ONC ARIA SESSION SUMMARY
Course Elapsed Days: 33
Plan Fractions Treated to Date: 24
Plan Prescribed Dose Per Fraction: 1.8 Gy
Plan Total Fractions Prescribed: 28
Plan Total Prescribed Dose: 50.4 Gy
Reference Point Dosage Given to Date: 43.2 Gy
Reference Point Session Dosage Given: 1.8 Gy
Session Number: 24

## 2024-03-07 ENCOUNTER — Ambulatory Visit

## 2024-03-07 ENCOUNTER — Other Ambulatory Visit: Payer: Self-pay

## 2024-03-07 ENCOUNTER — Ambulatory Visit
Admission: RE | Admit: 2024-03-07 | Discharge: 2024-03-07 | Disposition: A | Source: Ambulatory Visit | Attending: Radiation Oncology

## 2024-03-07 DIAGNOSIS — Z510 Encounter for antineoplastic radiation therapy: Secondary | ICD-10-CM | POA: Diagnosis not present

## 2024-03-07 LAB — RAD ONC ARIA SESSION SUMMARY
Course Elapsed Days: 34
Plan Fractions Treated to Date: 25
Plan Prescribed Dose Per Fraction: 1.8 Gy
Plan Total Fractions Prescribed: 28
Plan Total Prescribed Dose: 50.4 Gy
Reference Point Dosage Given to Date: 45 Gy
Reference Point Session Dosage Given: 1.8 Gy
Session Number: 25

## 2024-03-12 ENCOUNTER — Ambulatory Visit
Admission: RE | Admit: 2024-03-12 | Discharge: 2024-03-12 | Disposition: A | Discharge status: Home / Self Care | Source: Ambulatory Visit | Attending: Radiation Oncology | Admitting: Radiation Oncology

## 2024-03-12 ENCOUNTER — Other Ambulatory Visit: Payer: Self-pay

## 2024-03-12 DIAGNOSIS — Z510 Encounter for antineoplastic radiation therapy: Secondary | ICD-10-CM | POA: Diagnosis not present

## 2024-03-12 LAB — RAD ONC ARIA SESSION SUMMARY
Course Elapsed Days: 39
Plan Fractions Treated to Date: 26
Plan Prescribed Dose Per Fraction: 1.8 Gy
Plan Total Fractions Prescribed: 28
Plan Total Prescribed Dose: 50.4 Gy
Reference Point Dosage Given to Date: 46.8 Gy
Reference Point Session Dosage Given: 1.8 Gy
Session Number: 26

## 2024-03-13 ENCOUNTER — Ambulatory Visit
Admission: RE | Admit: 2024-03-13 | Discharge: 2024-03-13 | Disposition: A | Discharge status: Home / Self Care | Source: Ambulatory Visit | Attending: Radiation Oncology | Admitting: Radiation Oncology

## 2024-03-13 ENCOUNTER — Other Ambulatory Visit: Payer: Self-pay

## 2024-03-13 DIAGNOSIS — Z510 Encounter for antineoplastic radiation therapy: Secondary | ICD-10-CM | POA: Diagnosis not present

## 2024-03-13 LAB — RAD ONC ARIA SESSION SUMMARY
Course Elapsed Days: 40
Plan Fractions Treated to Date: 27
Plan Prescribed Dose Per Fraction: 1.8 Gy
Plan Total Fractions Prescribed: 28
Plan Total Prescribed Dose: 50.4 Gy
Reference Point Dosage Given to Date: 48.6 Gy
Reference Point Session Dosage Given: 1.8 Gy
Session Number: 27

## 2024-03-14 ENCOUNTER — Ambulatory Visit
Admission: RE | Admit: 2024-03-14 | Discharge: 2024-03-14 | Disposition: A | Discharge status: Home / Self Care | Source: Ambulatory Visit | Attending: Radiation Oncology | Admitting: Radiation Oncology

## 2024-03-14 ENCOUNTER — Other Ambulatory Visit: Payer: Self-pay

## 2024-03-14 DIAGNOSIS — Z510 Encounter for antineoplastic radiation therapy: Secondary | ICD-10-CM | POA: Diagnosis not present

## 2024-03-14 LAB — RAD ONC ARIA SESSION SUMMARY
Course Elapsed Days: 41
Plan Fractions Treated to Date: 28
Plan Prescribed Dose Per Fraction: 1.8 Gy
Plan Total Fractions Prescribed: 28
Plan Total Prescribed Dose: 50.4 Gy
Reference Point Dosage Given to Date: 50.4 Gy
Reference Point Session Dosage Given: 1.8 Gy
Session Number: 28

## 2024-03-16 ENCOUNTER — Other Ambulatory Visit: Payer: Self-pay

## 2024-03-16 ENCOUNTER — Ambulatory Visit
Admission: RE | Admit: 2024-03-16 | Discharge: 2024-03-16 | Disposition: A | Discharge status: Home / Self Care | Source: Ambulatory Visit | Attending: Radiation Oncology | Admitting: Radiation Oncology

## 2024-03-16 ENCOUNTER — Ambulatory Visit

## 2024-03-16 DIAGNOSIS — C50411 Malignant neoplasm of upper-outer quadrant of right female breast: Secondary | ICD-10-CM | POA: Diagnosis present

## 2024-03-16 DIAGNOSIS — Z510 Encounter for antineoplastic radiation therapy: Secondary | ICD-10-CM | POA: Insufficient documentation

## 2024-03-16 DIAGNOSIS — Z170 Estrogen receptor positive status [ER+]: Secondary | ICD-10-CM | POA: Insufficient documentation

## 2024-03-16 LAB — RAD ONC ARIA SESSION SUMMARY
Course Elapsed Days: 43
Plan Fractions Treated to Date: 1
Plan Prescribed Dose Per Fraction: 2 Gy
Plan Total Fractions Prescribed: 5
Plan Total Prescribed Dose: 10 Gy
Reference Point Dosage Given to Date: 2 Gy
Reference Point Session Dosage Given: 2 Gy
Session Number: 29

## 2024-03-19 ENCOUNTER — Other Ambulatory Visit: Payer: Self-pay

## 2024-03-19 ENCOUNTER — Ambulatory Visit

## 2024-03-19 ENCOUNTER — Ambulatory Visit
Admission: RE | Admit: 2024-03-19 | Discharge: 2024-03-19 | Disposition: A | Discharge status: Home / Self Care | Source: Ambulatory Visit | Attending: Radiation Oncology | Admitting: Radiation Oncology

## 2024-03-19 DIAGNOSIS — Z510 Encounter for antineoplastic radiation therapy: Secondary | ICD-10-CM | POA: Diagnosis not present

## 2024-03-19 LAB — RAD ONC ARIA SESSION SUMMARY
Course Elapsed Days: 46
Plan Fractions Treated to Date: 2
Plan Prescribed Dose Per Fraction: 2 Gy
Plan Total Fractions Prescribed: 5
Plan Total Prescribed Dose: 10 Gy
Reference Point Dosage Given to Date: 4 Gy
Reference Point Session Dosage Given: 2 Gy
Session Number: 30

## 2024-03-20 ENCOUNTER — Ambulatory Visit

## 2024-03-20 ENCOUNTER — Ambulatory Visit
Admission: RE | Admit: 2024-03-20 | Discharge: 2024-03-20 | Disposition: A | Source: Ambulatory Visit | Attending: Radiation Oncology

## 2024-03-20 ENCOUNTER — Other Ambulatory Visit: Payer: Self-pay

## 2024-03-20 DIAGNOSIS — Z510 Encounter for antineoplastic radiation therapy: Secondary | ICD-10-CM | POA: Diagnosis not present

## 2024-03-20 LAB — RAD ONC ARIA SESSION SUMMARY
Course Elapsed Days: 47
Plan Fractions Treated to Date: 3
Plan Prescribed Dose Per Fraction: 2 Gy
Plan Total Fractions Prescribed: 5
Plan Total Prescribed Dose: 10 Gy
Reference Point Dosage Given to Date: 6 Gy
Reference Point Session Dosage Given: 2 Gy
Session Number: 31

## 2024-03-21 ENCOUNTER — Ambulatory Visit

## 2024-03-21 ENCOUNTER — Ambulatory Visit
Admission: RE | Admit: 2024-03-21 | Discharge: 2024-03-21 | Disposition: A | Discharge status: Home / Self Care | Source: Ambulatory Visit | Attending: Radiation Oncology | Admitting: Radiation Oncology

## 2024-03-21 ENCOUNTER — Other Ambulatory Visit: Payer: Self-pay

## 2024-03-21 DIAGNOSIS — Z510 Encounter for antineoplastic radiation therapy: Secondary | ICD-10-CM | POA: Diagnosis not present

## 2024-03-21 LAB — RAD ONC ARIA SESSION SUMMARY
Course Elapsed Days: 48
Plan Fractions Treated to Date: 4
Plan Prescribed Dose Per Fraction: 2 Gy
Plan Total Fractions Prescribed: 5
Plan Total Prescribed Dose: 10 Gy
Reference Point Dosage Given to Date: 8 Gy
Reference Point Session Dosage Given: 2 Gy
Session Number: 32

## 2024-03-22 ENCOUNTER — Ambulatory Visit
Admission: RE | Admit: 2024-03-22 | Discharge: 2024-03-22 | Disposition: A | Discharge status: Home / Self Care | Source: Ambulatory Visit | Attending: Radiation Oncology | Admitting: Radiation Oncology

## 2024-03-22 ENCOUNTER — Ambulatory Visit

## 2024-03-22 ENCOUNTER — Other Ambulatory Visit: Payer: Self-pay

## 2024-03-22 DIAGNOSIS — Z510 Encounter for antineoplastic radiation therapy: Secondary | ICD-10-CM | POA: Diagnosis not present

## 2024-03-22 LAB — RAD ONC ARIA SESSION SUMMARY
Course Elapsed Days: 49
Plan Fractions Treated to Date: 5
Plan Prescribed Dose Per Fraction: 2 Gy
Plan Total Fractions Prescribed: 5
Plan Total Prescribed Dose: 10 Gy
Reference Point Dosage Given to Date: 10 Gy
Reference Point Session Dosage Given: 2 Gy
Session Number: 33

## 2024-03-23 ENCOUNTER — Ambulatory Visit

## 2024-03-23 NOTE — Radiation Completion Notes (Signed)
 Patient Name: Amanda Herring, SINGLE MRN: [id number] Date of Birth: Aug 02, 1955 Referring Physician: ANTHONY HINT, M.D. Date of Service: 2024-03-23 Radiation Oncologist: Norleen Limes, M.D. [facility]                             RADIATION ONCOLOGY END OF TREATMENT NOTE     Diagnosis: C50.411 Malignant neoplasm of upper-outer quadrant of right female breast Staging on 2024-01-23: Malignant neoplasm of upper-outer quadrant of right breast in female, estrogen receptor positive (HCC) T=pT2, N=pN0, M=cM0 Staging on 2023-11-16: Malignant neoplasm of upper-outer quadrant of right breast in female, estrogen receptor positive (HCC) T=cT1c, N=cN0, M=cM0 Intent: Curative     ==========DELIVERED PLANS==========  First Treatment Date: 2024-02-02 Last Treatment Date: 2024-03-22   Plan Name: Breast_R Site: Breast, Right Technique: 3D Mode: Photon Dose Per Fraction: 1.8 Gy Prescribed Dose (Delivered / Prescribed): 50.4 Gy / 50.4 Gy Prescribed Fxs (Delivered / Prescribed): 28 / 28   Plan Name: Breast_R_Bst Site: Breast, Right Technique: Electron Mode: Electron Dose Per Fraction: 2 Gy Prescribed Dose (Delivered / Prescribed): 10 Gy / 10 Gy Prescribed Fxs (Delivered / Prescribed): 5 / 5     ==========ON TREATMENT VISIT DATES========== 2024-02-03, 2024-02-08, 2024-02-17, 2024-02-24, 2024-03-02, 2024-03-16, 2024-03-22     ==========UPCOMING VISITS========== 04/23/2024 [facility]RADIATION ONC POST TREATMENT CALL [facility]POST TREATMENT  04/09/2024 [facility] REH AT [facility] SCREEN Aden Berwyn LABOR, PTA  03/27/2024 [facility]-MED ONCOLOGY EST PT 15 Iruku, Amber, MD        ==========APPENDIX - ON TREATMENT VISIT NOTES==========   See weekly On Treatment Notes in Epic for details in the Media tab (listed as Progress notes on the On Treatment Visit Dates listed above).

## 2024-03-26 ENCOUNTER — Ambulatory Visit

## 2024-03-27 ENCOUNTER — Inpatient Hospital Stay: Attending: Hematology and Oncology | Admitting: Hematology and Oncology

## 2024-03-27 VITALS — BP 132/64 | HR 64 | Temp 97.6°F | Resp 18 | Ht 64.0 in | Wt 219.8 lb

## 2024-03-27 DIAGNOSIS — Z79811 Long term (current) use of aromatase inhibitors: Secondary | ICD-10-CM | POA: Insufficient documentation

## 2024-03-27 DIAGNOSIS — Z170 Estrogen receptor positive status [ER+]: Secondary | ICD-10-CM | POA: Diagnosis not present

## 2024-03-27 DIAGNOSIS — Z79899 Other long term (current) drug therapy: Secondary | ICD-10-CM | POA: Diagnosis not present

## 2024-03-27 DIAGNOSIS — Z1732 Human epidermal growth factor receptor 2 negative status: Secondary | ICD-10-CM | POA: Diagnosis not present

## 2024-03-27 DIAGNOSIS — I10 Essential (primary) hypertension: Secondary | ICD-10-CM | POA: Diagnosis not present

## 2024-03-27 DIAGNOSIS — E039 Hypothyroidism, unspecified: Secondary | ICD-10-CM | POA: Diagnosis not present

## 2024-03-27 DIAGNOSIS — Z923 Personal history of irradiation: Secondary | ICD-10-CM | POA: Diagnosis not present

## 2024-03-27 DIAGNOSIS — C50411 Malignant neoplasm of upper-outer quadrant of right female breast: Secondary | ICD-10-CM | POA: Diagnosis not present

## 2024-03-27 DIAGNOSIS — Z1721 Progesterone receptor positive status: Secondary | ICD-10-CM | POA: Diagnosis not present

## 2024-03-27 DIAGNOSIS — Z17411 Hormone receptor positive with human epidermal growth factor receptor 2 negative status: Secondary | ICD-10-CM | POA: Insufficient documentation

## 2024-03-27 MED ORDER — ANASTROZOLE 1 MG PO TABS: 1.0000 mg | ORAL_TABLET | Freq: Every day | ORAL | 3 refills | Status: AC

## 2024-03-27 NOTE — Assessment & Plan Note (Signed)
" °  Assessment & Plan Estrogen receptor positive right breast cancer, upper-outer quadrant Completed surgery and radiation for estrogen receptor positive lobular carcinoma. Recommended adjuvant endocrine therapy with anastrozole  to reduce recurrence risk.  - Prescribed anastrozole  to begin February 1st post-radiation. - Provided 90-day supply of anastrozole . - MRD for surveillance.  - Scheduled follow-up in three months to assess anastrozole  tolerance and progress.  Adverse effects of breast cancer therapy Experiencing post-radiation skin changes and mild sternal soreness.  Expected to heal in the next few weeks.  At risk for musculoskeletal side effects and bone density loss from aromatase inhibitor therapy.  - Advised continuation of anastrozole  for at least 3-4 months unless intolerable side effects occur. - Ordered bone density scan to monitor for bone loss, to be scheduled in the next couple of months. - Recommended weight-bearing exercise for bone health when able, considering meniscus injury.  Amber Stalls MD    "

## 2024-03-27 NOTE — Progress Notes (Signed)
 [facility] CONSULT NOTE  Patient Care Team: Dyane Anthony RAMAN, FNP as PCP - General (Family Medicine) Tyree Nanetta SAILOR, RN as Oncology Nurse Navigator Gerome, Devere HERO, RN as Oncology Nurse Navigator Aron Shoulders, MD as Consulting Physician (General Surgery) Loretha Ash, MD as Consulting Physician (Hematology and Oncology) Dewey Rush, MD as Consulting Physician (Radiation Oncology)  CHIEF COMPLAINTS/PURPOSE OF CONSULTATION:  Breast cancer  HISTORY OF PRESENTING ILLNESS:  Amanda Herring 69 y.o. female is here because of recent diagnosis of right breast ILC.  I reviewed her records extensively and collaborated the history with the patient.  SUMMARY OF ONCOLOGIC HISTORY: Oncology History  Malignant neoplasm of upper-outer quadrant of right breast in female, estrogen receptor positive (HCC)  11/04/2023 Mammogram   There is a partly obscured oval mass in the retroareolar right breast corresponding to the palpable lump, measuring approximately 1.2 cm in size.   There is a small stable mass in the lateral retroareolar left breast. There no other defined masses, no areas of architectural distortion and no suspicious calcifications. The patient has retropectoral saline implants.  Targeted ultrasound is performed, showing a cystic and solid mass, predominantly solid, hypoechoic and superficial in the 12 o'clock position of the retroareolar right breast, corresponding to the lump. This measures 1.9 x 1.4 x 1.7 cm. There is internal blood flow within the solid components on color Doppler analysis. Margins are mostly ill-defined.   11/04/2023 Pathology Results   1. Breast, right, needle core biopsy, 12 o'clock :       INVASIVE MAMMARY CARCINOMA, SEE NOTE       TUBULE FORMATION: SCORE 3       NUCLEAR PLEOMORPHISM: SCORE 2       MITOTIC COUNT: SCORE 1       TOTAL SCORE: 6       OVERALL GRADE: 2       LYMPHOVASCULAR INVASION: NOT IDENTIFIED       CANCER LENGTH: 1.1  CM       CALCIFICATIONS: NOT IDENTIFIED       OTHER FINDINGS: NONE    Estrogen Receptor:  100%, POSITIVE, STRONG STAINING INTENSITY  Progesterone Receptor:  10%, POSITIVE, WEAK STAINING INTENSITY  Proliferation Marker Ki67:  1%  Her 2 by FISH negative.    11/11/2023 Initial Diagnosis   Malignant neoplasm of upper-outer quadrant of right breast in female, estrogen receptor positive (HCC)   11/16/2023 Cancer Staging   Staging form: Breast, AJCC 8th Edition - Clinical stage from 11/16/2023: Stage IA (cT1c, cN0, cM0, G2, ER+, PR+, HER2-) - Signed by Lanell Donald Stagger, PA-C on 11/16/2023 Stage prefix: Initial diagnosis Method of lymph node assessment: Clinical Histologic grading system: 3 grade system   11/26/2023 Genetic Testing   Negative genetic testing on the CancerNext-Expanded+RNAinsight panel.  The report date is November 26, 2023.  The CancerNext-Expanded gene panel offered by [facility] and includes sequencing, rearrangement, and RNA analysis for the following 77 genes: AIP, ALK, APC, ATM, BAP1, BARD1, BMPR1A, BRCA1, BRCA2, BRIP1, CDC73, CDH1, CDK4, CDKN1B, CDKN2A, CEBPA, CHEK2, CTNNA1, DDX41, DICER1, ETV6, FH, FLCN, GATA2, LZTR1, MAX, MBD4, MEN1, MET, MLH1, MSH2, MSH3, MSH6, MUTYH, NF1, NF2, NTHL1, PALB2, PHOX2B, PMS2, POT1, PRKAR1A, PTCH1, PTEN, RAD51C, RAD51D, RB1, RET, RPS20, RUNX1, SDHA, SDHAF2, SDHB, SDHC, SDHD, SMAD4, SMARCA4, SMARCB1, SMARCE1, STK11, SUFU, TMEM127, TP53, TSC1, TSC2, VHL, and WT1 (sequencing and deletion/duplication); AXIN2, CTNNA1, DDX41, EGFR, HOXB13, KIT, MBD4, MITF, MSH3, PDGFRA, POLD1 and POLE (sequencing only); EPCAM and GREM1 (deletion/duplication only). RNA data is  routinely analyzed for use in variant interpretation for all genes.    01/23/2024 Cancer Staging   Staging form: Breast, AJCC 8th Edition - Pathologic stage from 01/23/2024: Stage IA (pT2, pN0(sn), cM0, G2, ER+, PR+, HER2-, Oncotype DX score: 12) - Signed by Lanell Donald Stagger, PA-C on  01/23/2024 Stage prefix: Initial diagnosis Method of lymph node assessment: Sentinel lymph node biopsy Multigene prognostic tests performed: Oncotype DX Recurrence score range: Greater than or equal to 11 Histologic grading system: 3 grade system     Discussed the use of AI scribe software for clinical note transcription with the patient, who gave verbal consent to proceed.  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  She completed radiation therapy to the right breast last Thursday and is experiencing persistent skin tenderness, erythema, excoriation, and residual blisters over the treated area.  She describes soreness over the sternum, characterized as a bruised sensation. She does not sleep on her stomach but does sleep in a position that may place pressure on the area. No additional symptoms in this region are reported.  She is aware of potential side effects of aromatase inhibitors, including hot flashes, vaginal dryness, arthralgia, and myalgia. She has not previously undergone a bone density scan.  She is currently unable to perform weight-bearing exercise due to a suspected torn meniscus.  She is aware of ongoing surveillance for breast cancer recurrence, including periodic blood tests such as Signatera Reveal.  Rest of the pertinent 10 point ROS reviewed and neg.  MEDICAL HISTORY:  Past Medical History:  Diagnosis Date   Cancer [facility])    Family history of adverse reaction to anesthesia    per patient her dad had post n/v   Family history of bladder cancer    GERD (gastroesophageal reflux disease)    Hepatitis C    s/p treatment 2012   History of blood transfusion 1981   with delivery w/baby   History of kidney stones    passed stones    Hypertension    Hypothyroidism    Pneumonia    x 1 yrs ago   PONV (postoperative nausea and vomiting)     SURGICAL HISTORY: Past Surgical History:  Procedure Laterality Date   ABDOMINAL HYSTERECTOMY     BREAST BIOPSY Right 11/04/2023   US  RT BREAST BX W LOC DEV 1ST LESION IMG BX SPEC US  GUIDE 11/04/2023 [facility] MAMMOGRAPHY   BREAST EXCISIONAL BIOPSY Bilateral    1981/1980   BREAST LUMPECTOMY Left    1998   BREAST LUMPECTOMY Right 12/14/2023   Procedure: BREAST LUMPECTOMY;  Surgeon: Aron Shoulders, MD;  Location: [facility] OR;  Service: General;  Laterality: Right;   BREAST SURGERY     augmentation   RE-EXCISION OF BREAST LUMPECTOMY Right 12/29/2023   Procedure: EXCISION, LESION, BREAST;  Surgeon: Aron Shoulders, MD;  Location: [facility] OR;  Service: General;  Laterality: Right;  RE-EXCISION RIGHT BREAST LUMPECTOMY   SENTINEL NODE BIOPSY Right 12/14/2023   Procedure: BIOPSY, LYMPH NODE, SENTINEL;  Surgeon: Aron Shoulders, MD;  Location: [facility] OR;  Service: General;  Laterality: Right;  SENTINEL LYMPH NODE BIOPSY    SOCIAL HISTORY: Social History   Socioeconomic History   Marital status: Divorced    Spouse name: Not on file   Number of children: Not on file   Years of education:  Not on file   Highest education level: Not on file  Occupational History   Not on file  Tobacco Use   Smoking status: Never   Smokeless tobacco: Never  Vaping Use   Vaping status: Never Used  Substance and Sexual Activity   Alcohol use: No   Drug use: No   Sexual activity: Not Currently    Birth control/protection: Post-menopausal  Other Topics Concern   Not on file  Social History Narrative   Not on file   Social Drivers of Health   Tobacco Use: Low Risk (02/15/2024)   Patient History    Smoking Tobacco Use: Never    Smokeless Tobacco Use: Never    Passive Exposure: Not on file  Financial Resource Strain: Not on file  Food Insecurity: No Food Insecurity (01/25/2024)   Epic    Worried About Brewing [employer]ologist in the Last Year: Never true    Ran Out of Food in the Last Year: Never true  Transportation Needs: No Transportation Needs (01/25/2024)   Epic    Lack of Transportation (Medical): No    Lack of Transportation (Non-Medical): No  Physical Activity: Not on file  Stress: Not on file  Social Connections: Not on file  Intimate Partner Violence: Not At Risk (01/25/2024)   Epic    Fear of Current or Ex-Partner: No    Emotionally Abused: No    Physically Abused: No    Sexually Abused: No  Depression (PHQ2-9): Low Risk (01/25/2024)   Depression (PHQ2-9)    PHQ-2 Score: 0  Alcohol Screen: Not on file  Housing: Low Risk (01/25/2024)   Epic    Unable to Pay for Housing in the Last Year: No    Number of Times Moved in the Last Year: 0    Homeless in the Last Year: No  Utilities: Not At Risk (01/25/2024)   Epic    Threatened with loss of utilities: No  Health Literacy: Not on file    FAMILY HISTORY: Family History  Problem Relation Age of Onset   Skin cancer Father    Skin cancer Brother    Bladder Cancer Maternal Uncle    Breast cancer Neg Hx     ALLERGIES:  is allergic to codeine and dilaudid  [hydromorphone  hcl].  MEDICATIONS:  Current Outpatient Medications  Medication Sig Dispense Refill   anastrozole  (ARIMIDEX ) 1 MG tablet Take 1 tablet (1 mg total) by mouth daily. 90 tablet 3   ascorbic acid (VITAMIN C) 500 MG tablet Take 500 mg by mouth daily.     B Complex-C (SUPER B COMPLEX PO) Take 1 tablet by mouth daily.     Boswellia-Glucosamine-Vit D (OSTEO BI-FLEX ONE PER DAY PO) Take 1 tablet by mouth daily.     Calcium-Vitamin D (CALTRATE 600 PLUS-VIT D PO) Take 1 tablet by mouth daily.     cetirizine (ZYRTEC) 10 MG tablet Take 10 mg by mouth in the morning. (Patient taking differently: Take 5 mg by mouth in the morning.)     famotidine (PEPCID) 20 MG tablet Take 20 mg by mouth daily as needed for heartburn or indigestion.     hydrochlorothiazide (HYDRODIURIL) 25 MG tablet  Take 12.5 mg by mouth daily.     ibuprofen (ADVIL,MOTRIN) 200 MG tablet Take 600 mg by mouth every 6 (six) hours as needed for moderate pain.     levothyroxine (SYNTHROID) 50 MCG tablet Take 50 mcg by mouth every morning.     Multiple Vitamins-Minerals (CENTRUM SILVER  ADULT 50+ PO) Take 1 tablet by mouth every other day.     Omega-3 Fatty Acids (FISH OIL) 1000 MG CAPS Take 1,000 mg by mouth daily.     oxyCODONE  (OXY IR/ROXICODONE ) 5 MG immediate release tablet Take 1 tablet (5 mg total) by mouth every 6 (six) hours as needed for severe pain (pain score 7-10). (Patient not taking: Reported on 01/25/2024) 10 tablet 0   POTASSIUM PO Take 550 mg by mouth every other day.     zolpidem (AMBIEN) 10 MG tablet Take 5-10 mg by mouth at bedtime.     No current facility-administered medications for this visit.     PHYSICAL EXAMINATION: ECOG PERFORMANCE STATUS: 0 - Asymptomatic  Vitals:   03/27/24 0844  BP: 132/64  Pulse: 64  Resp: 18  Temp: 97.6 F (36.4 C)  SpO2: 100%    Filed Weights   03/27/24 0844  Weight: 219 lb 12.8 oz (99.7 kg)     GENERAL:alert, no distress and comfortable   LABORATORY DATA:  I have reviewed the data as listed Lab Results  Component Value Date   WBC 5.5 12/29/2023   HGB 13.7 12/29/2023   HCT 40.6 12/29/2023   MCV 92.9 12/29/2023   PLT 197 12/29/2023   Lab Results  Component Value Date   NA 138 12/29/2023   K 3.5 12/29/2023   CL 104 12/29/2023   CO2 22 12/29/2023    RADIOGRAPHIC STUDIES: I have personally reviewed the radiological reports and agreed with the findings in the report.  ASSESSMENT AND PLAN:  Malignant neoplasm of upper-outer quadrant of right breast in female, estrogen receptor positive (HCC)  Assessment & Plan Estrogen receptor positive right breast cancer, upper-outer quadrant Completed surgery and radiation for estrogen receptor positive lobular carcinoma. Recommended adjuvant endocrine therapy with anastrozole  to reduce  recurrence risk.  - Prescribed anastrozole  to begin February 1st post-radiation. - Provided 90-day supply of anastrozole . - MRD for surveillance.  - Scheduled follow-up in three months to assess anastrozole  tolerance and progress.  Adverse effects of breast cancer therapy Experiencing post-radiation skin changes and mild sternal soreness.  Expected to heal in the next few weeks.  At risk for musculoskeletal side effects and bone density loss from aromatase inhibitor therapy.  - Advised continuation of anastrozole  for at least 3-4 months unless intolerable side effects occur. - Ordered bone density scan to monitor for bone loss, to be scheduled in the next couple of months. - Recommended weight-bearing exercise for bone health when able, considering meniscus injury.  Amber Stalls MD     All questions were answered. The patient knows to call the clinic with any problems, questions or concerns.    Amber Stalls, MD 03/27/2024

## 2024-04-09 ENCOUNTER — Ambulatory Visit

## 2024-04-13 NOTE — Progress Notes (Incomplete)
" °  Radiation Oncology         [phone] ________________________________  Name: Amanda Herring MRN: [id number]  Date of Service: 04/23/2024  DOB: 1955-10-13  Post Treatment Telephone Note  Diagnosis:  Malignant neoplasm of upper-outer quadrant of right breast in female, estrogen receptor positive [facility])   First Treatment Date: 2024-02-02 Last Treatment Date: 2024-03-22   Plan Name: Breast_R Site: Breast, Right Technique: 3D Mode: Photon Dose Per Fraction: 1.8 Gy Prescribed Dose (Delivered / Prescribed): 50.4 Gy / 50.4 Gy Prescribed Fxs (Delivered / Prescribed): 28 / 28   Plan Name: Breast_R_Bst Site: Breast, Right Technique: Electron Mode: Electron Dose Per Fraction: 2 Gy Prescribed Dose (Delivered / Prescribed): 10 Gy / 10 Gy Prescribed Fxs (Delivered / Prescribed): 5 / 5  The patient {WAS/WAS NOT:[phone]::was not} available for call today.   Symptoms of fatigue {ACTIONS; HAVE/HAVE NOT:19434} improved since completing therapy.  Symptoms of skin changes {ACTIONS; HAVE/HAVE NOT:19434} improved since completing therapy.  The patient was encouraged to avoid sun exposure in the area of prior treatment for up to one year following radiation with either sunscreen or by the style of clothing worn in the sun.  The patient has scheduled follow up with her medical oncologist Dr. Loretha for ongoing surveillance, and was encouraged to call if she develops concerns or questions regarding radiation.   "

## 2024-04-23 ENCOUNTER — Ambulatory Visit

## 2024-05-07 ENCOUNTER — Ambulatory Visit: Attending: General Surgery

## 2024-07-06 ENCOUNTER — Inpatient Hospital Stay: Admitting: Adult Health
